# Patient Record
Sex: Male | Born: 1960 | Race: White | Hispanic: No | Marital: Married | Smoking: Former smoker
Health system: Southern US, Community
[De-identification: ages and names within clinical notes are randomized; demographics above are authoritative.]

## PROBLEM LIST (undated history)

## (undated) DIAGNOSIS — E785 Hyperlipidemia, unspecified: Secondary | ICD-10-CM

## (undated) DIAGNOSIS — G459 Transient cerebral ischemic attack, unspecified: Secondary | ICD-10-CM

## (undated) DIAGNOSIS — I10 Essential (primary) hypertension: Secondary | ICD-10-CM

## (undated) DIAGNOSIS — R079 Chest pain, unspecified: Secondary | ICD-10-CM

## (undated) DIAGNOSIS — I2510 Atherosclerotic heart disease of native coronary artery without angina pectoris: Secondary | ICD-10-CM

## (undated) DIAGNOSIS — J45909 Unspecified asthma, uncomplicated: Secondary | ICD-10-CM

## (undated) HISTORY — DX: Hyperlipidemia, unspecified: E78.5

## (undated) HISTORY — DX: Transient cerebral ischemic attack, unspecified: G45.9

## (undated) HISTORY — DX: Atherosclerotic heart disease of native coronary artery without angina pectoris: I25.10

## (undated) HISTORY — DX: Unspecified asthma, uncomplicated: J45.909

## (undated) HISTORY — PX: SINUS SURGERY WITH INSTATRAK: SHX5215

## (undated) HISTORY — DX: Morbid (severe) obesity due to excess calories: E66.01

## (undated) HISTORY — DX: Essential (primary) hypertension: I10

## (undated) HISTORY — DX: Chest pain, unspecified: R07.9

## (undated) HISTORY — PX: GALLBLADDER SURGERY: SHX652

---

## 1998-11-15 ENCOUNTER — Encounter: Payer: Self-pay | Admitting: Specialist

## 1998-11-15 ENCOUNTER — Ambulatory Visit (HOSPITAL_COMMUNITY): Admission: RE | Admit: 1998-11-15 | Discharge: 1998-11-15 | Payer: Self-pay | Admitting: Specialist

## 2001-06-07 ENCOUNTER — Encounter: Admission: RE | Admit: 2001-06-07 | Discharge: 2001-06-07 | Payer: Self-pay | Admitting: *Deleted

## 2001-06-07 ENCOUNTER — Encounter: Payer: Self-pay | Admitting: *Deleted

## 2001-06-28 ENCOUNTER — Ambulatory Visit (HOSPITAL_BASED_OUTPATIENT_CLINIC_OR_DEPARTMENT_OTHER): Admission: RE | Admit: 2001-06-28 | Discharge: 2001-06-28 | Payer: Self-pay | Admitting: *Deleted

## 2015-04-08 ENCOUNTER — Telehealth: Payer: Self-pay | Admitting: Cardiovascular Disease

## 2015-04-08 NOTE — Telephone Encounter (Signed)
Patient brought records in to office for his appointment on 04/14/15 with Dr Duke Salvia.  Records from Dr Wille Glaser @ [facility].  Records given to [facility] (medical records) for Dr Leonides Sake schedule on 04/14/15. lp

## 2015-04-14 ENCOUNTER — Encounter: Payer: Self-pay | Admitting: Cardiovascular Disease

## 2015-04-14 ENCOUNTER — Ambulatory Visit (INDEPENDENT_AMBULATORY_CARE_PROVIDER_SITE_OTHER): Payer: BLUE CROSS/BLUE SHIELD | Admitting: Cardiovascular Disease

## 2015-04-14 VITALS — BP 120/86 | HR 66 | Ht 64.0 in | Wt 264.0 lb

## 2015-04-14 DIAGNOSIS — R5383 Other fatigue: Secondary | ICD-10-CM

## 2015-04-14 DIAGNOSIS — R072 Precordial pain: Secondary | ICD-10-CM

## 2015-04-14 DIAGNOSIS — J45909 Unspecified asthma, uncomplicated: Secondary | ICD-10-CM | POA: Insufficient documentation

## 2015-04-14 DIAGNOSIS — R0602 Shortness of breath: Secondary | ICD-10-CM

## 2015-04-14 DIAGNOSIS — Z01812 Encounter for preprocedural laboratory examination: Secondary | ICD-10-CM

## 2015-04-14 DIAGNOSIS — D689 Coagulation defect, unspecified: Secondary | ICD-10-CM

## 2015-04-14 DIAGNOSIS — R079 Chest pain, unspecified: Secondary | ICD-10-CM | POA: Diagnosis not present

## 2015-04-14 DIAGNOSIS — I10 Essential (primary) hypertension: Secondary | ICD-10-CM

## 2015-04-14 DIAGNOSIS — G459 Transient cerebral ischemic attack, unspecified: Secondary | ICD-10-CM | POA: Insufficient documentation

## 2015-04-14 DIAGNOSIS — I2510 Atherosclerotic heart disease of native coronary artery without angina pectoris: Secondary | ICD-10-CM

## 2015-04-14 DIAGNOSIS — E785 Hyperlipidemia, unspecified: Secondary | ICD-10-CM

## 2015-04-14 NOTE — Patient Instructions (Signed)
Your physician has requested that you have a cardiac catheterization. Cardiac catheterization is used to diagnose and/or treat various heart conditions. Doctors may recommend this procedure for a number of different reasons. The most common reason is to evaluate chest pain. Chest pain can be a symptom of coronary artery disease (CAD), and cardiac catheterization can show whether plaque is narrowing or blocking your heart's arteries. This procedure is also used to evaluate the valves, as well as measure the blood flow and oxygen levels in different parts of your heart. For further information please visit [email or web address]   Following your catheterization, you will not be allowed to drive for 3 days.  No lifting, pushing, or pulling greater that 10 pounds is allowed for 1 week.  You will be required to have the following tests prior to the procedure:  1. Blood work-the blood work can be done no more than 7 days prior to the procedure.  It can be done at any [facility] lab.  There is one downstairs on the first floor of this building and one in the [facility] building [phone]).  2. Chest Xray-the chest xray order has already been placed at [facility] in the [facility] ([address] [place]).

## 2015-04-14 NOTE — Progress Notes (Signed)
Cardiology Office Note   Date:  04/15/2015   ID:  Randall Rojas, DOB 08-13-1960, MRN [id number]  PCP:  No primary care provider on file.  Cardiologist:   Madilyn Hook, MD   Chief Complaint  Rojas presents with  . New Evaluation    [name], MD--former Cardiologist in [place]//pt c/o pain in left arm and leg--feels strained, had chest pain a few weeks ago  . Shortness of Breath    on minimal exertion  . Edema    bilateral legs/feet/ankles      History of Present Illness: Randall Rojas is a 55 y.o. male with hypertension, asymptomatic coronary calcifications on CT, and palpitations who presents for an evaluation of chest pain.  Randall Rojas has been evaluated for chest pain numerous times since 2009.  He was previously cared for by Dr. [name] in [place], [place].  Randall Rojas reports sharp substernal chest pain that radiates across Randall left side of his chest.  Episodes last for 30-40 minutes and are associated with shortness of breath, nausea, and diaphoresis.  He also notes left arm and leg tingling.  This occurs separately from Randall chest pain.  These symptoms occur once or twice per month.  Randall most intense episode occurred this summer and he was evaluated in Randall ED in [place].  Cardiac enzymes and EKG were unremarkable.  He reports that Randall ED staff was unable to reach his cardiologist, which is what prompted him to get a second opinion in [place].    Randall Rojas has previously had two stress tests that were negative for ischemia.  In 2009 he had a pharmacologic Cardiolite that revealed an LVEF of 40% and no inducible ischemia.  Follow up echo showed normal systolic function.  He had an exercise Cardiolite 12/09/12 that was also negative for ischemia and had an LVEF of 54%.  Randall Rojas also underwent chest CT angiography in 2015 that was negative for PE but did reveal diffuse coronary calcification in all three coronary arteries.  Given this finding, per his  report and Dr. Deedra Ehrich notes, Randall plan was to pursue cardiac catheterization if he continued to have chest discomfort.  He last saw Dr. Wille Glaser 01/2014, at which time he reported palpitations.  He underwent a 30 day event monitor that was unremarkable.  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  He noes occasional chest pain with exertion but also gets it at rest.  He has been treated for GERD with Prilosec, though this does not alleviate his chest pain.  Nitroglycerin and aspirin do help when he has chest pain.  He was treated with pravastatin but was unable to tolerate it due to myalgias.  He has not tried any other statins and has been taking red yeast rice instead.  Randall Rojas was last seen by Dr. [name] on 02/11/14 due to palpitations. 1 Randall Rojas underwent nuclear stress testing that was negative for ischemia.  However, after Randall stress he continued have chest pain so he underwent CT angiography. This revealed non-occlusive coronary calcifications on cardiac CT.  He is intolerant of statins and has been taking red yeast rice, spirin and atenolol.  At that appointment he was noted to have a 1/6 systolic murmur and no arrhythmias.    Randall Rojas father had a heart attack in his 24s.  He denies a family history of premature CAD.    Past Medical History  Diagnosis Date  . TIA (transient ischemic attack)   .  Asthma   . Coronary artery disease   . Coronary artery calcification seen on CT scan 04/15/2015  . Essential hypertension 04/15/2015  . Hyperlipidemia 04/15/2015  . Morbid obesity (HCC) 04/15/2015  . Chest pain 04/15/2015    Past Surgical History  Procedure Laterality Date  . Gallbladder surgery    . Sinus surgery with instatrak       Current Outpatient Prescriptions  Medication Sig Dispense Refill  . albuterol (PROVENTIL HFA;VENTOLIN HFA) 108 (90 Base) MCG/ACT inhaler Inhale 2 puffs into Randall lungs every 6 (six) hours as needed for wheezing or shortness of  breath.    Marland Kitchen aspirin 81 MG EC tablet Take 1 tablet by mouth daily.    Marland Kitchen atenolol (TENORMIN) 50 MG tablet Take 50 mg by mouth daily.    . nitroGLYCERIN (NITROSTAT) 0.4 MG SL tablet Place 0.4 mg under Randall tongue as needed.    Marland Kitchen omeprazole (PRILOSEC) 40 MG capsule Take 1 capsule by mouth daily.  0  . Red Yeast Rice Extract 600 MG CAPS Take 1 capsule by mouth daily.     No current facility-administered medications for this visit.    Allergies:   Cephalexin    Social History:  Randall Rojas  reports that he quit smoking about 26 years ago. He has never used smokeless tobacco.   Family History:  Randall Rojas's family history includes Cancer in his brother and father; Heart attack in his father; Hypertension in his mother and sister; Rheum arthritis in his mother.    ROS:  Please see Randall history of present illness.   Otherwise, review of systems are positive for none.   All other systems are reviewed and negative.    PHYSICAL EXAM: VS:  BP 120/86 mmHg  Pulse 66  Ht  (1.626 m)  Wt 119.75 kg (264 lb)  BMI 45.29 kg/m2 , BMI Body mass index is 45.29 kg/(m^2). GENERAL:  Well appearing.  No acute distress. HEENT:  Pupils equal round and reactive, fundi not visualized, oral mucosa unremarkable NECK:  No jugular venous distention, waveform within normal limits, carotid upstroke brisk and symmetric, no bruits, no thyromegaly LYMPHATICS:  No cervical adenopathy LUNGS:  Clear to auscultation bilaterally HEART:  RRR.  PMI not displaced or sustained,S1 and S2 within normal limits, no S3, no S4, no clicks, no rubs, no  murmurs ABD:  Flat, positive bowel sounds normal in frequency in pitch, no bruits, no rebound, no guarding, no midline pulsatile mass, no hepatomegaly, no splenomegaly EXT:  2 plus pulses throughout, no edema, no cyanosis no clubbing SKIN:  No rashes no nodules NEURO:  Cranial nerves II through XII grossly intact, motor grossly intact throughout PSYCH:  Cognitively intact, oriented  to person place and time    EKG:  EKG is ordered today. Randall ekg ordered today demonstrates sinus rhythm.  Rate 66 bpm.  Echo 03/05/08: LVEF 55-60%. Mild concentric LVH. Mild left atrial enlargement.mild mitral regurgitation. Mild tricuspid regurgitation.  Dobutamine Cardiolite 03/09/08: LVEF 40%. No inducible ischemia.  Exercise Cardiolite 12/09/12:  8 METS. LVEF 54%. No inducible ischia.  30 day event monitor 03/09/14: Sinus rhythm, sinus tachycardia.   Recent Labs: No results found for requested labs within last 365 days.    Lipid Panel No results found for: CHOL, TRIG, HDL, CHOLHDL, VLDL, LDLCALC, LDLDIRECT    Wt Readings from Last 3 Encounters:  04/14/15 119.75 kg (264 lb)      ASSESSMENT AND PLAN:  # Chest pain: Randall Rojas symptoms are atypical and  he has undergone two negative stress tests.  However, he does have known coronary calcifications in all three coronary arteries and several risk factors including hypertension, suboptimally treated hyperlipidemia and obesity.  Given that he has been seen in Randall ED several times for chest pain, it is reasonable to proceed with cardiac catheterization as suggested to him by his cardiologist, Dr. Wille Glaser.  Although Randall likelihood of finding obstructive disease may be low, it is worth it to define his anatomy and assess Randall severity of Randall calcifications noted on CT.  He will need basic labs prior to cath.  We discussed Randall options, including repeat stress test and a dedicated cardiac CT-A.  He does not think he can tolerate another CT due to claustrophobia. He is uninterested in repeating a stress test.  We will also check a TSH/fT4 as an atypical cause of chest pain and because he has noted some palpitations.  Continue aspirin and atenolol.  # Edema, SOB: Randall Rojas reports edema and shortness of breath.  LVEF was >55% and there were no valvular abnormalities in 2009.  LVEF was 54% on echo in 2014.  He appears to be euvolemic on exam  today.  We will check a BNP today.  # Hypertension: BP well-controlled today.  Continue atenolol.  # Hyperlipidemia: Lipids have been elevated in Randall past, though I do not know exact values from his records.  He did not tolerate pravastatin.  He will come back for a fasting lipid panel.   Current medicines are reviewed at length with Randall Rojas today.  Randall Rojas does not have concerns regarding medicines.  Randall following changes have been made:  no change  Labs/ tests ordered today include:   Orders Placed This Encounter  Procedures  . DG Chest 2 View  . CBC  . Basic metabolic panel  . Protime-INR  . APTT  . TSH  . Brain natriuretic peptide  . EKG 12-Lead  . LEFT HEART CATHETERIZATION WITH CORONARY/GRAFT ANGIOGRAM     Disposition:   FU with Tavi Hoogendoorn C. Duke Salvia, MD, [facility] after cath.    This note was written with Randall assistance of speech recognition software.  Please excuse any transcriptional errors.  Signed, Eldar Robitaille C. Duke Salvia, MD, [facility]  04/15/2015 5:53 AM    [facility]

## 2015-04-15 ENCOUNTER — Ambulatory Visit
Admission: RE | Admit: 2015-04-15 | Discharge: 2015-04-15 | Disposition: A | Discharge status: Home / Self Care | Payer: BLUE CROSS/BLUE SHIELD | Source: Ambulatory Visit | Attending: Cardiovascular Disease | Admitting: Cardiovascular Disease

## 2015-04-15 ENCOUNTER — Encounter: Payer: Self-pay | Admitting: Cardiovascular Disease

## 2015-04-15 DIAGNOSIS — E66813 Obesity, class 3: Secondary | ICD-10-CM | POA: Insufficient documentation

## 2015-04-15 DIAGNOSIS — I2584 Coronary atherosclerosis due to calcified coronary lesion: Secondary | ICD-10-CM | POA: Insufficient documentation

## 2015-04-15 DIAGNOSIS — I2510 Atherosclerotic heart disease of native coronary artery without angina pectoris: Secondary | ICD-10-CM

## 2015-04-15 DIAGNOSIS — E785 Hyperlipidemia, unspecified: Secondary | ICD-10-CM

## 2015-04-15 DIAGNOSIS — E782 Mixed hyperlipidemia: Secondary | ICD-10-CM | POA: Insufficient documentation

## 2015-04-15 DIAGNOSIS — R079 Chest pain, unspecified: Secondary | ICD-10-CM

## 2015-04-15 DIAGNOSIS — Z01812 Encounter for preprocedural laboratory examination: Secondary | ICD-10-CM

## 2015-04-15 DIAGNOSIS — I10 Essential (primary) hypertension: Secondary | ICD-10-CM

## 2015-04-15 HISTORY — DX: Morbid (severe) obesity due to excess calories: E66.01

## 2015-04-15 HISTORY — DX: Mixed hyperlipidemia: E78.2

## 2015-04-15 HISTORY — DX: Atherosclerotic heart disease of native coronary artery without angina pectoris: I25.10

## 2015-04-15 HISTORY — DX: Chest pain, unspecified: R07.9

## 2015-04-15 HISTORY — DX: Obesity, class 3: E66.813

## 2015-04-15 HISTORY — DX: Hyperlipidemia, unspecified: E78.5

## 2015-04-15 HISTORY — DX: Essential (primary) hypertension: I10

## 2015-04-15 LAB — TSH: TSH: 1.062 u[IU]/mL (ref 0.350–4.500)

## 2015-04-15 LAB — CBC
HCT: 46.2 % (ref 39.0–52.0)
Hemoglobin: 15.6 g/dL (ref 13.0–17.0)
MCH: 31 pg (ref 26.0–34.0)
MCHC: 33.8 g/dL (ref 30.0–36.0)
MCV: 91.7 fL (ref 78.0–100.0)
MPV: 10.5 fL (ref 8.6–12.4)
PLATELETS: 169 10*3/uL (ref 150–400)
RBC: 5.04 MIL/uL (ref 4.22–5.81)
RDW: 14.3 % (ref 11.5–15.5)
WBC: 7.6 10*3/uL (ref 4.0–10.5)

## 2015-04-15 LAB — BASIC METABOLIC PANEL
BUN: 13 mg/dL (ref 7–25)
CALCIUM: 9.4 mg/dL (ref 8.6–10.3)
CHLORIDE: 103 mmol/L (ref 98–110)
CO2: 27 mmol/L (ref 20–31)
Creat: 0.98 mg/dL (ref 0.70–1.33)
Glucose, Bld: 93 mg/dL (ref 65–99)
Potassium: 4.4 mmol/L (ref 3.5–5.3)
SODIUM: 139 mmol/L (ref 135–146)

## 2015-04-16 LAB — BRAIN NATRIURETIC PEPTIDE: Brain Natriuretic Peptide: 18.4 pg/mL (ref 0.0–100.0)

## 2015-04-16 LAB — APTT: APTT: 31 s (ref 24–37)

## 2015-04-16 LAB — PROTIME-INR
INR: 0.92 (ref <1.50)
Prothrombin Time: 12.5 seconds (ref 11.6–15.2)

## 2015-04-19 ENCOUNTER — Telehealth: Payer: Self-pay | Admitting: *Deleted

## 2015-04-19 NOTE — Telephone Encounter (Signed)
-----   Message from Chilton Si, MD sent at 04/18/2015  4:07 PM EST ----- Normal labs.

## 2015-04-19 NOTE — Telephone Encounter (Signed)
Spoke to patient. Result given . Verbalized understanding Schedule for cath.

## 2015-04-22 ENCOUNTER — Encounter (HOSPITAL_COMMUNITY)
Admission: RE | Disposition: A | Discharge status: Home / Self Care | Payer: Self-pay | Source: Ambulatory Visit | Attending: Cardiology

## 2015-04-22 ENCOUNTER — Ambulatory Visit (HOSPITAL_COMMUNITY)
Admission: RE | Admit: 2015-04-22 | Discharge: 2015-04-22 | Disposition: A | Discharge status: Home / Self Care | Payer: BLUE CROSS/BLUE SHIELD | Source: Ambulatory Visit | Attending: Cardiology | Admitting: Cardiology

## 2015-04-22 DIAGNOSIS — J45909 Unspecified asthma, uncomplicated: Secondary | ICD-10-CM | POA: Insufficient documentation

## 2015-04-22 DIAGNOSIS — R002 Palpitations: Secondary | ICD-10-CM | POA: Insufficient documentation

## 2015-04-22 DIAGNOSIS — Z7982 Long term (current) use of aspirin: Secondary | ICD-10-CM | POA: Diagnosis not present

## 2015-04-22 DIAGNOSIS — I10 Essential (primary) hypertension: Secondary | ICD-10-CM | POA: Diagnosis not present

## 2015-04-22 DIAGNOSIS — Z87891 Personal history of nicotine dependence: Secondary | ICD-10-CM | POA: Diagnosis not present

## 2015-04-22 DIAGNOSIS — R072 Precordial pain: Secondary | ICD-10-CM | POA: Insufficient documentation

## 2015-04-22 DIAGNOSIS — Z8673 Personal history of transient ischemic attack (TIA), and cerebral infarction without residual deficits: Secondary | ICD-10-CM | POA: Insufficient documentation

## 2015-04-22 DIAGNOSIS — E785 Hyperlipidemia, unspecified: Secondary | ICD-10-CM | POA: Diagnosis not present

## 2015-04-22 DIAGNOSIS — R079 Chest pain, unspecified: Secondary | ICD-10-CM | POA: Diagnosis present

## 2015-04-22 DIAGNOSIS — Z6841 Body Mass Index (BMI) 40.0 and over, adult: Secondary | ICD-10-CM | POA: Diagnosis not present

## 2015-04-22 DIAGNOSIS — E66813 Obesity, class 3: Secondary | ICD-10-CM | POA: Diagnosis present

## 2015-04-22 DIAGNOSIS — I2510 Atherosclerotic heart disease of native coronary artery without angina pectoris: Secondary | ICD-10-CM | POA: Diagnosis present

## 2015-04-22 DIAGNOSIS — F40240 Claustrophobia: Secondary | ICD-10-CM | POA: Diagnosis not present

## 2015-04-22 DIAGNOSIS — Z8249 Family history of ischemic heart disease and other diseases of the circulatory system: Secondary | ICD-10-CM | POA: Insufficient documentation

## 2015-04-22 DIAGNOSIS — K219 Gastro-esophageal reflux disease without esophagitis: Secondary | ICD-10-CM | POA: Insufficient documentation

## 2015-04-22 DIAGNOSIS — E782 Mixed hyperlipidemia: Secondary | ICD-10-CM | POA: Diagnosis present

## 2015-04-22 DIAGNOSIS — I2584 Coronary atherosclerosis due to calcified coronary lesion: Secondary | ICD-10-CM | POA: Diagnosis present

## 2015-04-22 HISTORY — PX: CARDIAC CATHETERIZATION: SHX172

## 2015-04-22 SURGERY — LEFT HEART CATH AND CORONARY ANGIOGRAPHY
Anesthesia: LOCAL

## 2015-04-22 MED ORDER — SODIUM CHLORIDE 0.9% FLUSH
3.0000 mL | INTRAVENOUS | Status: DC | PRN
Start: 2015-04-22 — End: 2015-04-22

## 2015-04-22 MED ORDER — VERAPAMIL HCL 2.5 MG/ML IV SOLN
INTRAVENOUS | Status: AC
  Filled 2015-04-22: qty 2

## 2015-04-22 MED ORDER — HEPARIN (PORCINE) IN NACL 2-0.9 UNIT/ML-% IJ SOLN
INTRAMUSCULAR | Status: AC
  Filled 2015-04-22: qty 1000

## 2015-04-22 MED ORDER — VERAPAMIL HCL 2.5 MG/ML IV SOLN
INTRAVENOUS | Status: DC | PRN
  Administered 2015-04-22: 13:00:00 via INTRA_ARTERIAL

## 2015-04-22 MED ORDER — SODIUM CHLORIDE 0.9% FLUSH: 3.0000 mL | Freq: Two times a day (BID) | INTRAVENOUS | Status: DC

## 2015-04-22 MED ORDER — SODIUM CHLORIDE 0.9 % IV SOLN: 250.0000 mL | INTRAVENOUS | Status: DC | PRN

## 2015-04-22 MED ORDER — MIDAZOLAM HCL 2 MG/2ML IJ SOLN
INTRAMUSCULAR | Status: DC | PRN
  Administered 2015-04-22: 1 mg via INTRAVENOUS

## 2015-04-22 MED ORDER — IOHEXOL 350 MG/ML SOLN
INTRAVENOUS | Status: DC | PRN
Start: 2015-04-22 — End: 2015-04-22
  Administered 2015-04-22: 95 mL via INTRA_ARTERIAL

## 2015-04-22 MED ORDER — SODIUM CHLORIDE 0.9 % WEIGHT BASED INFUSION
3.0000 mL/kg/h | INTRAVENOUS | Status: DC
  Administered 2015-04-22: 3 mL/kg/h via INTRAVENOUS

## 2015-04-22 MED ORDER — HEPARIN SODIUM (PORCINE) 1000 UNIT/ML IJ SOLN
INTRAMUSCULAR | Status: DC | PRN
  Administered 2015-04-22: 6000 [IU] via INTRAVENOUS

## 2015-04-22 MED ORDER — ASPIRIN 81 MG PO CHEW
81.0000 mg | CHEWABLE_TABLET | ORAL | Status: AC
  Administered 2015-04-22: 81 mg via ORAL

## 2015-04-22 MED ORDER — FENTANYL CITRATE (PF) 100 MCG/2ML IJ SOLN
INTRAMUSCULAR | Status: DC | PRN
  Administered 2015-04-22: 25 ug via INTRAVENOUS

## 2015-04-22 MED ORDER — HEPARIN SODIUM (PORCINE) 1000 UNIT/ML IJ SOLN
INTRAMUSCULAR | Status: AC
  Filled 2015-04-22: qty 1

## 2015-04-22 MED ORDER — ASPIRIN 81 MG PO CHEW
CHEWABLE_TABLET | ORAL | Status: AC
  Administered 2015-04-22: 81 mg via ORAL
  Filled 2015-04-22: qty 1

## 2015-04-22 MED ORDER — FENTANYL CITRATE (PF) 100 MCG/2ML IJ SOLN
INTRAMUSCULAR | Status: AC
  Filled 2015-04-22: qty 2

## 2015-04-22 MED ORDER — SODIUM CHLORIDE 0.9 % WEIGHT BASED INFUSION
3.0000 mL/kg/h | INTRAVENOUS | Status: AC
  Administered 2015-04-22: 3 mL/kg/h via INTRAVENOUS

## 2015-04-22 MED ORDER — SODIUM CHLORIDE 0.9 % WEIGHT BASED INFUSION
1.0000 mL/kg/h | INTRAVENOUS | Status: DC
  Administered 2015-04-22: 1 mL/kg/h via INTRAVENOUS

## 2015-04-22 MED ORDER — MIDAZOLAM HCL 2 MG/2ML IJ SOLN
INTRAMUSCULAR | Status: AC
  Filled 2015-04-22: qty 2

## 2015-04-22 MED ORDER — LIDOCAINE HCL (PF) 1 % IJ SOLN
INTRAMUSCULAR | Status: AC
  Filled 2015-04-22: qty 30

## 2015-04-22 MED ORDER — SODIUM CHLORIDE 0.9% FLUSH: 3.0000 mL | INTRAVENOUS | Status: DC | PRN

## 2015-04-22 MED ORDER — HEPARIN (PORCINE) IN NACL 2-0.9 UNIT/ML-% IJ SOLN
INTRAMUSCULAR | Status: DC | PRN
  Administered 2015-04-22: 14:00:00

## 2015-04-22 SURGICAL SUPPLY — 14 items
CATH INFINITI 5 FR JL3.5 (CATHETERS) ×2 IMPLANT
CATH INFINITI 5FR ANG PIGTAIL (CATHETERS) ×2 IMPLANT
CATH INFINITI JR4 5F (CATHETERS) ×2 IMPLANT
CATH LAUNCHER 5F AL1 (CATHETERS) IMPLANT
CATH SITESEER 5F NTR (CATHETERS) ×1 IMPLANT
CATHETER LAUNCHER 5F AL1 (CATHETERS) ×2
DEVICE RAD COMP TR BAND LRG (VASCULAR PRODUCTS) ×2 IMPLANT
GLIDESHEATH SLEND SS 6F .021 (SHEATH) ×2 IMPLANT
KIT HEART LEFT (KITS) ×2 IMPLANT
PACK CARDIAC CATHETERIZATION (CUSTOM PROCEDURE TRAY) ×2 IMPLANT
SYR MEDRAD MARK V 150ML (SYRINGE) ×2 IMPLANT
TRANSDUCER W/STOPCOCK (MISCELLANEOUS) ×2 IMPLANT
TUBING CIL FLEX 10 FLL-RA (TUBING) ×2 IMPLANT
WIRE SAFE-T 1.5MM-J .035X260CM (WIRE) ×2 IMPLANT

## 2015-04-22 NOTE — Interval H&P Note (Signed)
History and Physical Interval Note:  04/22/2015 1:17 PM  Randall Rojas  has presented today for surgery, with the diagnosis of cp  The various methods of treatment have been discussed with the patient and family. After consideration of risks, benefits and other options for treatment, the patient has consented to  Procedure(s): Left Heart Cath and Coronary Angiography (N/A) as a surgical intervention .  The patient's history has been reviewed, patient examined, no change in status, stable for surgery.  I have reviewed the patient's chart and labs.  Questions were answered to the patient's satisfaction.   Cath Lab Visit (complete for each Cath Lab visit)  Clinical Evaluation Leading to the Procedure:   ACS: No.  Non-ACS:    Anginal Classification: CCS III  Anti-ischemic medical therapy: Minimal Therapy (1 class of medications)  Non-Invasive Test Results: No non-invasive testing performed  Prior CABG: No previous CABG        Theron Arista [facility] 04/22/2015 1:17 PM

## 2015-04-22 NOTE — Research (Signed)
CADLAD Informed Consent   Subject Name: Randall Rojas  Subject met inclusion and exclusion criteria.  The informed consent form, study requirements and expectations were reviewed with the subject and questions and concerns were addressed prior to the signing of the consent form.  The subject verbalized understanding of the trail requirements.  The subject agreed to participate in the CADLAD trial and signed the informed consent.  The informed consent was obtained prior to performance of any protocol-specific procedures for the subject.  A copy of the signed informed consent was given to the subject and a copy was placed in the subject's medical record.  [name] 04/22/2015, [phone] am

## 2015-04-22 NOTE — Discharge Instructions (Signed)
Radial Site Care °Refer to this sheet in the next few weeks. These instructions provide you with information about caring for yourself after your procedure. Your health care provider may also give you more specific instructions. Your treatment has been planned according to current medical practices, but problems sometimes occur. Call your health care provider if you have any problems or questions after your procedure. °WHAT TO EXPECT AFTER THE PROCEDURE °After your procedure, it is typical to have the following: °· Bruising at the radial site that usually fades within 1-2 weeks. °· Blood collecting in the tissue (hematoma) that may be painful to the touch. It should usually decrease in size and tenderness within 1-2 weeks. °HOME CARE INSTRUCTIONS °· Take medicines only as directed by your health care provider. °· You may shower 24-48 hours after the procedure or as directed by your health care provider. Remove the bandage (dressing) and gently wash the site with plain soap and water. Pat the area dry with a clean towel. Do not rub the site, because this may cause bleeding. °· Do not take baths, swim, or use a hot tub until your health care provider approves. °· Check your insertion site every day for redness, swelling, or drainage. °· Do not apply powder or lotion to the site. °· Do not flex or bend the affected arm for 24 hours or as directed by your health care provider. °· Do not push or pull heavy objects with the affected arm for 24 hours or as directed by your health care provider. °· Do not lift over 10 lb (4.5 kg) for 5 days after your procedure or as directed by your health care provider. °· Ask your health care provider when it is okay to: °¨ Return to work or school. °¨ Resume usual physical activities or sports. °¨ Resume sexual activity. °· Do not drive home if you are discharged the same day as the procedure. Have someone else drive you. °· You may drive 24 hours after the procedure unless otherwise  instructed by your health care provider. °· Do not operate machinery or power tools for 24 hours after the procedure. °· If your procedure was done as an outpatient procedure, which means that you went home the same day as your procedure, a responsible adult should be with you for the first 24 hours after you arrive home. °· Keep all follow-up visits as directed by your health care provider. This is important. °SEEK MEDICAL CARE IF: °· You have a fever. °· You have chills. °· You have increased bleeding from the radial site. Hold pressure on the site. CALL 911 °SEEK IMMEDIATE MEDICAL CARE IF: °· You have unusual pain at the radial site. °· You have redness, warmth, or swelling at the radial site. °· You have drainage (other than a small amount of blood on the dressing) from the radial site. °· The radial site is bleeding, and the bleeding does not stop after 30 minutes of holding steady pressure on the site. °· Your arm or hand becomes pale, cool, tingly, or numb. °  °This information is not intended to replace advice given to you by your health care provider. Make sure you discuss any questions you have with your health care provider. °  °Document Released: 04/15/2010 Document Revised: 04/03/2014 Document Reviewed: 09/29/2013 °Elsevier Interactive Patient Education ©2016 [employer] ° °

## 2015-04-22 NOTE — H&P (View-Only) (Signed)
Cardiology Office Note   Date:  04/15/2015   ID:  Randall Rojas, DOB 08-13-1960, MRN [id number]  PCP:  No primary care provider on file.  Cardiologist:   Madilyn Hook, MD   Chief Complaint  Patient presents with  . 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 Shortness of Breath    on minimal exertion  . Edema    bilateral legs/feet/ankles      History of Present Illness: Randall Rojas is a 55 y.o. male with hypertension, asymptomatic coronary calcifications on CT, and palpitations who presents for an evaluation of chest pain.  Randall Rojas has been evaluated for chest pain numerous times since 2009.  He was previously cared for by Dr. Bonnielee Rojas in [place], [place].  Randall Rojas reports sharp substernal chest pain that radiates across the left side of his chest.  Episodes last for 30-40 minutes and are associated with shortness of breath, nausea, and diaphoresis.  He also notes left arm and leg tingling.  This occurs separately from the chest pain.  These symptoms occur once or twice per month.  The most intense episode occurred this summer and he was evaluated in the ED in [place].  Cardiac enzymes and EKG were unremarkable.  He reports that the ED staff was unable to reach his cardiologist, which is what prompted him to get a second opinion in [place].    Randall Rojas has previously had two stress tests that were negative for ischemia.  In 2009 he had a pharmacologic Cardiolite that revealed an LVEF of 40% and no inducible ischemia.  Follow up echo showed normal systolic function.  He had an exercise Cardiolite 12/09/12 that was also negative for ischemia and had an LVEF of 54%.  Randall Rojas also underwent chest CT angiography in 2015 that was negative for PE but did reveal diffuse coronary calcification in all three coronary arteries.  Given this finding, per his  report and Dr. Deedra Ehrich notes, the plan was to pursue cardiac catheterization if he continued to have chest discomfort.  He last saw Dr. Wille Glaser 01/2014, at which time he reported palpitations.  He underwent a 30 day event monitor that was unremarkable.  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  He noes occasional chest pain with exertion but also gets it at rest.  He has been treated for GERD with Prilosec, though this does not alleviate his chest pain.  Nitroglycerin and aspirin do help when he has chest pain.  He was treated with pravastatin but was unable to tolerate it due to myalgias.  He has not tried any other statins and has been taking red yeast rice instead.  Randall Rojas was last seen by Dr. Bonnielee Rojas on 02/11/14 due to palpitations. 1 Randall Rojas underwent nuclear stress testing that was negative for ischemia.  However, after the stress he continued have chest pain so he underwent CT angiography. This revealed non-occlusive coronary calcifications on cardiac CT.  He is intolerant of statins and has been taking red yeast rice, spirin and atenolol.  At that appointment he was noted to have a 1/6 systolic murmur and no arrhythmias.    Randall Rojas father had a heart attack in his 24s.  He denies a family history of premature CAD.    Past Medical History  Diagnosis Date  . TIA (transient ischemic attack)   .  Asthma   . Coronary artery disease   . Coronary artery calcification seen on CT scan 04/15/2015  . Essential hypertension 04/15/2015  . Hyperlipidemia 04/15/2015  . Morbid obesity (HCC) 04/15/2015  . Chest pain 04/15/2015    Past Surgical History  Procedure Laterality Date  . Gallbladder surgery    . Sinus surgery with instatrak       Current Outpatient Prescriptions  Medication Sig Dispense Refill  . albuterol (PROVENTIL HFA;VENTOLIN HFA) 108 (90 Base) MCG/ACT inhaler Inhale 2 puffs into the lungs every 6 (six) hours as needed for wheezing or shortness of  breath.    Marland Kitchen aspirin 81 MG EC tablet Take 1 tablet by mouth daily.    Marland Kitchen atenolol (TENORMIN) 50 MG tablet Take 50 mg by mouth daily.    . nitroGLYCERIN (NITROSTAT) 0.4 MG SL tablet Place 0.4 mg under the tongue as needed.    Marland Kitchen omeprazole (PRILOSEC) 40 MG capsule Take 1 capsule by mouth daily.  0  . Red Yeast Rice Extract 600 MG CAPS Take 1 capsule by mouth daily.     No current facility-administered medications for this visit.    Allergies:   Cephalexin    Social History:  The patient  reports that he quit smoking about 26 years ago. He has never used smokeless tobacco.   Family History:  The patient's family history includes Cancer in his brother and father; Heart attack in his father; Hypertension in his mother and sister; Rheum arthritis in his mother.    ROS:  Please see the history of present illness.   Otherwise, review of systems are positive for none.   All other systems are reviewed and negative.    PHYSICAL EXAM: VS:  BP 120/86 mmHg  Pulse 66  Ht  (1.626 m)  Wt 119.75 kg (264 lb)  BMI 45.29 kg/m2 , BMI Body mass index is 45.29 kg/(m^2). GENERAL:  Well appearing.  No acute distress. HEENT:  Pupils equal round and reactive, fundi not visualized, oral mucosa unremarkable NECK:  No jugular venous distention, waveform within normal limits, carotid upstroke brisk and symmetric, no bruits, no thyromegaly LYMPHATICS:  No cervical adenopathy LUNGS:  Clear to auscultation bilaterally HEART:  RRR.  PMI not displaced or sustained,S1 and S2 within normal limits, no S3, no S4, no clicks, no rubs, no  murmurs ABD:  Flat, positive bowel sounds normal in frequency in pitch, no bruits, no rebound, no guarding, no midline pulsatile mass, no hepatomegaly, no splenomegaly EXT:  2 plus pulses throughout, no edema, no cyanosis no clubbing SKIN:  No rashes no nodules NEURO:  Cranial nerves II through XII grossly intact, motor grossly intact throughout PSYCH:  Cognitively intact, oriented  to person place and time    EKG:  EKG is ordered today. The ekg ordered today demonstrates sinus rhythm.  Rate 66 bpm.  Echo 03/05/08: LVEF 55-60%. Mild concentric LVH. Mild left atrial enlargement.mild mitral regurgitation. Mild tricuspid regurgitation.  Dobutamine Cardiolite 03/09/08: LVEF 40%. No inducible ischemia.  Exercise Cardiolite 12/09/12:  8 METS. LVEF 54%. No inducible ischia.  30 day event monitor 03/09/14: Sinus rhythm, sinus tachycardia.   Recent Labs: No results found for requested labs within last 365 days.    Lipid Panel No results found for: CHOL, TRIG, HDL, CHOLHDL, VLDL, LDLCALC, LDLDIRECT    Wt Readings from Last 3 Encounters:  04/14/15 119.75 kg (264 lb)      ASSESSMENT AND PLAN:  # Chest pain: Mr. Chalfin symptoms are atypical and  he has undergone two negative stress tests.  However, he does have known coronary calcifications in all three coronary arteries and several risk factors including hypertension, suboptimally treated hyperlipidemia and obesity.  Given that he has been seen in the ED several times for chest pain, it is reasonable to proceed with cardiac catheterization as suggested to him by his cardiologist, Dr. Wille Glaser.  Although the likelihood of finding obstructive disease may be low, it is worth it to define his anatomy and assess the severity of the calcifications noted on CT.  He will need basic labs prior to cath.  We discussed the options, including repeat stress test and a dedicated cardiac CT-A.  He does not think he can tolerate another CT due to claustrophobia. He is uninterested in repeating a stress test.  We will also check a TSH/fT4 as an atypical cause of chest pain and because he has noted some palpitations.  Continue aspirin and atenolol.  # Edema, SOB: Mr. Platts reports edema and shortness of breath.  LVEF was >55% and there were no valvular abnormalities in 2009.  LVEF was 54% on echo in 2014.  He appears to be euvolemic on exam  today.  We will check a BNP today.  # Hypertension: BP well-controlled today.  Continue atenolol.  # Hyperlipidemia: Lipids have been elevated in the past, though I do not know exact values from his records.  He did not tolerate pravastatin.  He will come back for a fasting lipid panel.   Current medicines are reviewed at length with the patient today.  The patient does not have concerns regarding medicines.  The following changes have been made:  no change  Labs/ tests ordered today include:   Orders Placed This Encounter  Procedures  . DG Chest 2 View  . CBC  . Basic metabolic panel  . Protime-INR  . APTT  . TSH  . Brain natriuretic peptide  . EKG 12-Lead  . LEFT HEART CATHETERIZATION WITH CORONARY/GRAFT ANGIOGRAM     Disposition:   FU with Demarie Uhlig C. Duke Salvia, MD, [facility] after cath.    This note was written with the assistance of speech recognition software.  Please excuse any transcriptional errors.  Signed, Omeed Osuna C. Duke Salvia, MD, [facility]  04/15/2015 5:53 AM    [facility]

## 2015-04-23 ENCOUNTER — Encounter (HOSPITAL_COMMUNITY): Payer: Self-pay | Admitting: Cardiology

## 2015-05-27 ENCOUNTER — Ambulatory Visit: Payer: BLUE CROSS/BLUE SHIELD | Admitting: Cardiovascular Disease

## 2015-06-06 DIAGNOSIS — D72829 Elevated white blood cell count, unspecified: Secondary | ICD-10-CM

## 2015-06-06 DIAGNOSIS — R739 Hyperglycemia, unspecified: Secondary | ICD-10-CM | POA: Insufficient documentation

## 2015-06-06 HISTORY — DX: Hyperglycemia, unspecified: R73.9

## 2015-06-06 HISTORY — DX: Elevated white blood cell count, unspecified: D72.829

## 2017-02-20 DIAGNOSIS — L299 Pruritus, unspecified: Secondary | ICD-10-CM | POA: Diagnosis not present

## 2017-02-20 DIAGNOSIS — B351 Tinea unguium: Secondary | ICD-10-CM | POA: Diagnosis not present

## 2017-03-18 DIAGNOSIS — R51 Headache: Secondary | ICD-10-CM | POA: Diagnosis not present

## 2017-03-18 DIAGNOSIS — W000XXA Fall on same level due to ice and snow, initial encounter: Secondary | ICD-10-CM | POA: Diagnosis not present

## 2017-03-18 DIAGNOSIS — R002 Palpitations: Secondary | ICD-10-CM | POA: Diagnosis not present

## 2017-03-18 DIAGNOSIS — S0990XA Unspecified injury of head, initial encounter: Secondary | ICD-10-CM | POA: Diagnosis not present

## 2017-03-18 DIAGNOSIS — R531 Weakness: Secondary | ICD-10-CM | POA: Diagnosis not present

## 2017-03-18 DIAGNOSIS — R0789 Other chest pain: Secondary | ICD-10-CM | POA: Diagnosis not present

## 2017-03-18 DIAGNOSIS — R110 Nausea: Secondary | ICD-10-CM | POA: Diagnosis not present

## 2017-03-18 DIAGNOSIS — F0781 Postconcussional syndrome: Secondary | ICD-10-CM | POA: Diagnosis not present

## 2017-03-19 DIAGNOSIS — R002 Palpitations: Secondary | ICD-10-CM | POA: Diagnosis not present

## 2017-04-10 DIAGNOSIS — G4733 Obstructive sleep apnea (adult) (pediatric): Secondary | ICD-10-CM | POA: Diagnosis not present

## 2017-07-02 DIAGNOSIS — M79605 Pain in left leg: Secondary | ICD-10-CM | POA: Diagnosis not present

## 2017-07-02 DIAGNOSIS — Y92096 Garden or yard of other non-institutional residence as the place of occurrence of the external cause: Secondary | ICD-10-CM | POA: Diagnosis not present

## 2017-07-02 DIAGNOSIS — Y999 Unspecified external cause status: Secondary | ICD-10-CM | POA: Diagnosis not present

## 2017-07-02 DIAGNOSIS — M7062 Trochanteric bursitis, left hip: Secondary | ICD-10-CM | POA: Diagnosis not present

## 2017-07-02 DIAGNOSIS — X509XXA Other and unspecified overexertion or strenuous movements or postures, initial encounter: Secondary | ICD-10-CM | POA: Diagnosis not present

## 2017-07-06 DIAGNOSIS — M7662 Achilles tendinitis, left leg: Secondary | ICD-10-CM | POA: Diagnosis not present

## 2017-07-06 DIAGNOSIS — M25572 Pain in left ankle and joints of left foot: Secondary | ICD-10-CM | POA: Diagnosis not present

## 2017-07-06 DIAGNOSIS — M65272 Calcific tendinitis, left ankle and foot: Secondary | ICD-10-CM | POA: Diagnosis not present

## 2017-07-20 DIAGNOSIS — G4733 Obstructive sleep apnea (adult) (pediatric): Secondary | ICD-10-CM | POA: Diagnosis not present

## 2017-07-31 DIAGNOSIS — S83242A Other tear of medial meniscus, current injury, left knee, initial encounter: Secondary | ICD-10-CM | POA: Diagnosis not present

## 2017-07-31 DIAGNOSIS — M25862 Other specified joint disorders, left knee: Secondary | ICD-10-CM | POA: Diagnosis not present

## 2017-07-31 DIAGNOSIS — M25861 Other specified joint disorders, right knee: Secondary | ICD-10-CM | POA: Diagnosis not present

## 2017-07-31 DIAGNOSIS — M1712 Unilateral primary osteoarthritis, left knee: Secondary | ICD-10-CM | POA: Diagnosis not present

## 2017-10-18 DIAGNOSIS — G4733 Obstructive sleep apnea (adult) (pediatric): Secondary | ICD-10-CM | POA: Diagnosis not present

## 2018-01-16 DIAGNOSIS — G4733 Obstructive sleep apnea (adult) (pediatric): Secondary | ICD-10-CM | POA: Diagnosis not present

## 2018-05-23 DIAGNOSIS — Z23 Encounter for immunization: Secondary | ICD-10-CM | POA: Diagnosis not present

## 2018-07-18 DIAGNOSIS — G4733 Obstructive sleep apnea (adult) (pediatric): Secondary | ICD-10-CM | POA: Diagnosis not present

## 2018-10-17 DIAGNOSIS — G4733 Obstructive sleep apnea (adult) (pediatric): Secondary | ICD-10-CM | POA: Diagnosis not present

## 2018-10-25 DIAGNOSIS — M7661 Achilles tendinitis, right leg: Secondary | ICD-10-CM | POA: Diagnosis not present

## 2018-10-25 DIAGNOSIS — M7662 Achilles tendinitis, left leg: Secondary | ICD-10-CM | POA: Diagnosis not present

## 2018-10-25 DIAGNOSIS — M25572 Pain in left ankle and joints of left foot: Secondary | ICD-10-CM | POA: Diagnosis not present

## 2019-01-02 DIAGNOSIS — R600 Localized edema: Secondary | ICD-10-CM | POA: Diagnosis not present

## 2019-01-02 DIAGNOSIS — M2559 Pain in other specified joint: Secondary | ICD-10-CM | POA: Diagnosis not present

## 2019-01-02 DIAGNOSIS — R799 Abnormal finding of blood chemistry, unspecified: Secondary | ICD-10-CM | POA: Diagnosis not present

## 2019-01-02 DIAGNOSIS — R5383 Other fatigue: Secondary | ICD-10-CM | POA: Diagnosis not present

## 2019-04-06 ENCOUNTER — Encounter (HOSPITAL_COMMUNITY): Payer: Self-pay | Admitting: Internal Medicine

## 2019-04-06 ENCOUNTER — Inpatient Hospital Stay (HOSPITAL_COMMUNITY): Payer: Commercial Managed Care - PPO

## 2019-04-06 ENCOUNTER — Inpatient Hospital Stay (HOSPITAL_COMMUNITY)
Admission: AD | Admit: 2019-04-06 | Discharge: 2019-04-07 | DRG: 177 | Disposition: A | Discharge status: Home / Self Care | Payer: Commercial Managed Care - PPO | Source: Other Acute Inpatient Hospital | Attending: Student | Admitting: Student

## 2019-04-06 ENCOUNTER — Other Ambulatory Visit: Payer: Self-pay

## 2019-04-06 DIAGNOSIS — Z8673 Personal history of transient ischemic attack (TIA), and cerebral infarction without residual deficits: Secondary | ICD-10-CM

## 2019-04-06 DIAGNOSIS — J1282 Pneumonia due to coronavirus disease 2019: Secondary | ICD-10-CM | POA: Diagnosis present

## 2019-04-06 DIAGNOSIS — E66813 Obesity, class 3: Secondary | ICD-10-CM

## 2019-04-06 DIAGNOSIS — R739 Hyperglycemia, unspecified: Secondary | ICD-10-CM | POA: Diagnosis present

## 2019-04-06 DIAGNOSIS — I10 Essential (primary) hypertension: Secondary | ICD-10-CM | POA: Diagnosis present

## 2019-04-06 DIAGNOSIS — I2510 Atherosclerotic heart disease of native coronary artery without angina pectoris: Secondary | ICD-10-CM | POA: Diagnosis present

## 2019-04-06 DIAGNOSIS — U071 COVID-19: Principal | ICD-10-CM | POA: Diagnosis present

## 2019-04-06 DIAGNOSIS — Z87891 Personal history of nicotine dependence: Secondary | ICD-10-CM | POA: Diagnosis not present

## 2019-04-06 DIAGNOSIS — Z79899 Other long term (current) drug therapy: Secondary | ICD-10-CM

## 2019-04-06 DIAGNOSIS — Z8249 Family history of ischemic heart disease and other diseases of the circulatory system: Secondary | ICD-10-CM

## 2019-04-06 DIAGNOSIS — Z888 Allergy status to other drugs, medicaments and biological substances status: Secondary | ICD-10-CM

## 2019-04-06 DIAGNOSIS — E785 Hyperlipidemia, unspecified: Secondary | ICD-10-CM | POA: Diagnosis present

## 2019-04-06 DIAGNOSIS — Z6841 Body Mass Index (BMI) 40.0 and over, adult: Secondary | ICD-10-CM | POA: Diagnosis not present

## 2019-04-06 DIAGNOSIS — J45901 Unspecified asthma with (acute) exacerbation: Secondary | ICD-10-CM | POA: Diagnosis present

## 2019-04-06 DIAGNOSIS — T380X5A Adverse effect of glucocorticoids and synthetic analogues, initial encounter: Secondary | ICD-10-CM | POA: Diagnosis present

## 2019-04-06 DIAGNOSIS — D696 Thrombocytopenia, unspecified: Secondary | ICD-10-CM | POA: Diagnosis present

## 2019-04-06 HISTORY — DX: Pneumonia due to coronavirus disease 2019: J12.82

## 2019-04-06 HISTORY — DX: COVID-19: U07.1

## 2019-04-06 LAB — C-REACTIVE PROTEIN: CRP: 2.1 mg/dL — ABNORMAL HIGH (ref <1.0)

## 2019-04-06 LAB — D-DIMER, QUANTITATIVE: D-Dimer, Quant: 0.33 ug/mL-FEU (ref 0.00–0.50)

## 2019-04-06 LAB — COMPREHENSIVE METABOLIC PANEL
ALT: 27 U/L (ref 0–44)
AST: 23 U/L (ref 15–41)
Albumin: 3.6 g/dL (ref 3.5–5.0)
Alkaline Phosphatase: 55 U/L (ref 38–126)
Anion gap: 9 (ref 5–15)
BUN: 18 mg/dL (ref 6–20)
CO2: 28 mmol/L (ref 22–32)
Calcium: 8.4 mg/dL — ABNORMAL LOW (ref 8.9–10.3)
Chloride: 102 mmol/L (ref 98–111)
Creatinine, Ser: 0.95 mg/dL (ref 0.61–1.24)
GFR calc Af Amer: >60 mL/min (ref >60)
GFR calc non Af Amer: >60 mL/min (ref >60)
Glucose, Bld: 132 mg/dL — ABNORMAL HIGH (ref 70–99)
Potassium: 4.1 mmol/L (ref 3.5–5.1)
Sodium: 139 mmol/L (ref 135–145)
Total Bilirubin: 0.7 mg/dL (ref 0.3–1.2)
Total Protein: 7 g/dL (ref 6.5–8.1)

## 2019-04-06 LAB — CBC
HCT: 49.9 % (ref 39.0–52.0)
Hemoglobin: 16.2 g/dL (ref 13.0–17.0)
MCH: 30.3 pg (ref 26.0–34.0)
MCHC: 32.5 g/dL (ref 30.0–36.0)
MCV: 93.4 fL (ref 80.0–100.0)
Platelets: 100 10*3/uL — ABNORMAL LOW (ref 150–400)
RBC: 5.34 MIL/uL (ref 4.22–5.81)
RDW: 13.2 % (ref 11.5–15.5)
WBC: 4.4 10*3/uL (ref 4.0–10.5)
nRBC: 0 % (ref 0.0–0.2)

## 2019-04-06 LAB — HIV ANTIBODY (ROUTINE TESTING W REFLEX): HIV Screen 4th Generation wRfx: NONREACTIVE

## 2019-04-06 LAB — BRAIN NATRIURETIC PEPTIDE: B Natriuretic Peptide: 14.8 pg/mL (ref 0.0–100.0)

## 2019-04-06 MED ORDER — ZINC SULFATE 220 (50 ZN) MG PO CAPS: 220.0000 mg | ORAL_CAPSULE | Freq: Every day | ORAL | 0 refills | Status: DC

## 2019-04-06 MED ORDER — ZINC SULFATE 220 (50 ZN) MG PO CAPS
220.0000 mg | ORAL_CAPSULE | Freq: Every day | ORAL | Status: DC
  Administered 2019-04-06 – 2019-04-07 (×2): 220 mg via ORAL
  Filled 2019-04-06 (×2): qty 1

## 2019-04-06 MED ORDER — HYDROCOD POLST-CPM POLST ER 10-8 MG/5ML PO SUER: 5.0000 mL | Freq: Two times a day (BID) | ORAL | 0 refills | Status: DC

## 2019-04-06 MED ORDER — ACETAMINOPHEN 325 MG PO TABS: 650.0000 mg | ORAL_TABLET | Freq: Four times a day (QID) | ORAL | Status: DC | PRN

## 2019-04-06 MED ORDER — TAB-A-VITE/IRON PO TABS
1.0000 | ORAL_TABLET | Freq: Every day | ORAL | Status: DC
  Administered 2019-04-06 – 2019-04-07 (×2): 1 via ORAL
  Filled 2019-04-06 (×2): qty 1

## 2019-04-06 MED ORDER — ALBUTEROL SULFATE HFA 108 (90 BASE) MCG/ACT IN AERS: 2.0000 | INHALATION_SPRAY | Freq: Four times a day (QID) | RESPIRATORY_TRACT | 0 refills | Status: DC | PRN

## 2019-04-06 MED ORDER — ACETAMINOPHEN 325 MG PO TABS: 650.0000 mg | ORAL_TABLET | Freq: Four times a day (QID) | ORAL | 2 refills | Status: DC | PRN

## 2019-04-06 MED ORDER — SODIUM CHLORIDE 0.9% FLUSH
3.0000 mL | Freq: Two times a day (BID) | INTRAVENOUS | Status: DC
  Administered 2019-04-06 – 2019-04-07 (×3): 3 mL via INTRAVENOUS

## 2019-04-06 MED ORDER — SODIUM CHLORIDE 0.9 % IV SOLN
200.0000 mg | Freq: Once | INTRAVENOUS | Status: DC
  Filled 2019-04-06: qty 40

## 2019-04-06 MED ORDER — SODIUM CHLORIDE 0.9 % IV SOLN
100.0000 mg | Freq: Every day | INTRAVENOUS | Status: DC
  Administered 2019-04-06 – 2019-04-07 (×2): 100 mg via INTRAVENOUS
  Filled 2019-04-06 (×2): qty 100

## 2019-04-06 MED ORDER — ENOXAPARIN SODIUM 60 MG/0.6ML ~~LOC~~ SOLN
60.0000 mg | SUBCUTANEOUS | Status: DC
  Administered 2019-04-06: 60 mg via SUBCUTANEOUS
  Filled 2019-04-06: qty 0.6

## 2019-04-06 MED ORDER — DEXAMETHASONE 4 MG PO TABS
6.0000 mg | ORAL_TABLET | Freq: Every day | ORAL | Status: DC
  Administered 2019-04-06 – 2019-04-07 (×2): 6 mg via ORAL
  Filled 2019-04-06 (×2): qty 2

## 2019-04-06 MED ORDER — ASCORBIC ACID 500 MG PO TABS
1000.0000 mg | ORAL_TABLET | Freq: Every day | ORAL | Status: DC
  Administered 2019-04-06 – 2019-04-07 (×2): 1000 mg via ORAL
  Filled 2019-04-06 (×2): qty 2

## 2019-04-06 MED ORDER — ASCORBIC ACID 500 MG PO TABS: 1000.0000 mg | ORAL_TABLET | Freq: Two times a day (BID) | ORAL | 0 refills | Status: DC

## 2019-04-06 NOTE — H&P (Signed)
History and Physical    Randall Rojas:[id number] DOB: [birth year] DOA: 04/06/2019  PCP: Shelle Iron, MD  Patient coming from: Home (transferred from [facility] health)  I have personally briefly reviewed patient's old medical records in [facility]  Chief Complaint: Cough, dyspnea  HPI: Randall Rojas is a 59 y.o. male with medical history significant of morbid obesity, hypertension (not on medications), former smoker quit 30 years ago, Asthma who presented as transfer from [facility] for COVID-19 pneumonia.  Complains of cough, dyspnea, fever ongoing for a week.  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  Denies abdominal pain, diarrhea, nausea or vomiting but has a poor appetite. No history of COVID-19 positive exposure.  ED Course: At [facility] initial vitals temperature 100 F, heart rate 97/min, respiratory rate 22/min, blood pressure 121/74.  He was placed on 2 L oxygen via nasal cannula for hypoxia.  Rapid RT-PCR test positive.  Chest x-ray negative for infiltrates.  Pertinent lab work include WBC count 5000, platelet 94,000.  Creatinine normal.  pH 7.4 PO2 62 on room air.  Received dexamethasone and remdesivir at [facility] ED and transferred here for further care due to lack of beds.  Review of Systems: As per HPI otherwise 10 point review of systems negative.   Past Medical History:  Diagnosis Date  . Asthma   . Chest pain 04/15/2015  . Coronary artery calcification seen on CT scan 04/15/2015  . Coronary artery disease   . Essential hypertension 04/15/2015  . Hyperlipidemia 04/15/2015  . Morbid obesity (HCC) 04/15/2015  . TIA (transient ischemic attack)     Past Surgical History:  Procedure Laterality Date  . CARDIAC CATHETERIZATION N/A 04/22/2015   Procedure: Left Heart Cath and Coronary Angiography;  Surgeon: Peter M Swaziland, MD;  Location: [facility] INVASIVE CV LAB;  Service: Cardiovascular;  Laterality: N/A;  . GALLBLADDER SURGERY      . SINUS SURGERY WITH INSTATRAK       reports that he quit smoking about 30 years ago. He has never used smokeless tobacco. No history on file for alcohol and drug.  Allergies  Allergen Reactions  . Cephalexin Nausea And Vomiting and Rash    Family History  Problem Relation Age of Onset  . Hypertension Mother   . Rheum arthritis Mother   . Cancer Father   . Heart attack Father   . Cancer Brother   . Hypertension Sister      Prior to Admission medications   Medication Sig Start Date End Date Taking? Authorizing Provider  acetaminophen (TYLENOL) 325 MG tablet Take 650 mg by mouth every 6 (six) hours as needed for moderate pain.   Yes [provider]    Physical Exam: Vitals:   04/06/19 0307  BP: 118/77  Pulse: 73  Resp: 18  Temp: 98.1 F (36.7 C)  TempSrc: Oral  SpO2: 97%  Weight: 120.7 kg  Height: 5\' 4"  (1.626 m)    Constitutional: NAD, calm, comfortable Vitals:   04/06/19 0307  BP: 118/77  Pulse: 73  Resp: 18  Temp: 98.1 F (36.7 C)  TempSrc: Oral  SpO2: 97%  Weight: 120.7 kg  Height: 5\' 4"  (1.626 m)   Eyes: PERRL, lids and conjunctivae normal ENMT: Mucous membranes are moist. Neck: normal, supple, no masses, no thyromegaly Respiratory: Decreased breath sounds bilaterally on anterior auscultation, no wheezing, no crackles. Normal respiratory effort. No accessory muscle use.  Cardiovascular: Regular rate and rhythm, no  murmurs. No extremity edema. Abdomen: no tenderness. No hepatosplenomegaly. Bowel sounds positive.  Musculoskeletal: no clubbing / cyanosis. No joint deformity upper and lower extremities. Good ROM, no contractures. Skin: no rashes, lesions, ulcers. No induration Neurologic: CN 2-12 grossly intact. Sensation intact, DTR normal. Strength 5/5 in all 4.  Psychiatric: Normal judgment and insight. Alert and oriented x 3. Normal mood.   Labs on Admission: I have personally reviewed following labs and imaging studies  CBC: No results  for input(s): WBC, NEUTROABS, HGB, HCT, MCV, PLT in the last 168 hours. Basic Metabolic Panel: No results for input(s): NA, K, CL, CO2, GLUCOSE, BUN, CREATININE, CALCIUM, MG, PHOS in the last 168 hours. GFR: CrCl cannot be calculated (Patient's most recent lab result is older than the maximum 21 days allowed.). Liver Function Tests: No results for input(s): AST, ALT, ALKPHOS, BILITOT, PROT, ALBUMIN in the last 168 hours. No results for input(s): LIPASE, AMYLASE in the last 168 hours. No results for input(s): AMMONIA in the last 168 hours. Coagulation Profile: No results for input(s): INR, PROTIME in the last 168 hours. Cardiac Enzymes: No results for input(s): CKTOTAL, CKMB, CKMBINDEX, TROPONINI in the last 168 hours. BNP (last 3 results) No results for input(s): PROBNP in the last 8760 hours. HbA1C: No results for input(s): HGBA1C in the last 72 hours. CBG: No results for input(s): GLUCAP in the last 168 hours. Lipid Profile: No results for input(s): CHOL, HDL, LDLCALC, TRIG, CHOLHDL, LDLDIRECT in the last 72 hours. Thyroid Function Tests: No results for input(s): TSH, T4TOTAL, FREET4, T3FREE, THYROIDAB in the last 72 hours. Anemia Panel: No results for input(s): VITAMINB12, FOLATE, FERRITIN, TIBC, IRON, RETICCTPCT in the last 72 hours. Urine analysis: No results found for: COLORURINE, APPEARANCEUR, LABSPEC, PHURINE, GLUCOSEU, HGBUR, BILIRUBINUR, KETONESUR, PROTEINUR, UROBILINOGEN, NITRITE, LEUKOCYTESUR  Radiological Exams on Admission: No results found.  EKG: Independently reviewed.   Assessment/Plan Active Problems:   Obesity, Class III, BMI 40-49.9 (morbid obesity) (HCC)   Pneumonia due to COVID-19 virus  COVID-19 pneumonia Acute hypoxic respiratory failure requiring 2 L of oxygen via nasal cannula Chest x-ray negative.  Procalcitonin negative.  Troponins negative.  No leukocytosis. Continue remdesivir, dexamethasone, multivitamins Trend inflammation  markers  Hypertension Asthma Controlled Not on any home medications  DVT prophylaxis: Lovenox SQ Code Status: Full code  Family Communication: Discussed plan with patient Disposition Plan: Home when stable Consults called: None Admission status: Inpatient   Lucky Cowboy MD [facility]  If 7PM-7AM, please contact night-coverage 04/06/2019, 7:09 AM

## 2019-04-06 NOTE — Progress Notes (Signed)
PROGRESS NOTE  JP EASTHAM VHQ:[id number] DOB: 1957-09-02   PCP: Shelle Iron, MD  Patient is from: Home  DOA: 04/06/2019 LOS: 0  Brief Narrative / Interim history: 59 year old male with history of morbid obesity, HTN and asthma transferred from [facility] after he presented today with cough, dyspnea, fever and poor appetite for about 1 week.  Tested positive for COVID-19.   Reportedly mild temp to 100F. Chest x-ray without acute finding.  Started on 2 L, remdesivir and Decadron, and transferred here for further care for COVID-19 infection.  CMP, CBC, D-dimer, BNP and HIV with no significant finding other than glucose to 132 on platelet to 100.  Subjective: No major events overnight of this morning.  Continues to endorse dry cough and weakness.  No GI or UTI symptoms.  Still with poor appetite.  Saturating in upper 90s on 2 L.  Objective: Vitals:   04/06/19 0307 04/06/19 0713 04/06/19 1135 04/06/19 1218  BP: 118/77 118/84 131/82   Pulse: 73 70 84 82  Resp: 18 20    Temp: 98.1 F (36.7 C) 98 F (36.7 C) 98.1 F (36.7 C)   TempSrc: Oral Oral Oral   SpO2: 97% 96% 97% 97%  Weight: 120.7 kg     Height: 5\' 4"  (1.626 m)       Intake/Output Summary (Last 24 hours) at 04/06/2019 1312 Last data filed at 04/06/2019 0708 Gross per 24 hour  Intake --  Output 475 ml  Net -475 ml   Filed Weights   04/06/19 0307  Weight: 120.7 kg    Examination:  GENERAL: No acute distress.  Appears well.  HEENT: MMM.  Vision and hearing grossly intact.  NECK: Supple.  No apparent JVD.  RESP: 97% on 2 L.  No high WOB.  Fair aeration bilaterally. CVS:  RRR. Heart sounds normal.  ABD/GI/GU: Bowel sounds present. Soft. Non tender.  MSK/EXT:  Moves extremities. No apparent deformity. No edema.  SKIN: no apparent skin lesion or wound NEURO: Awake, alert and oriented appropriately.  No apparent focal neuro deficit. PSYCH: Calm. Normal affect.   Procedures:  None  Assessment &  Plan: COVID-19 infection: symptomatic for about a week.  Tested positive on 1/9 at [facility].  No documented desaturation but started on 2 L by [place].  Now 96% on 2 L.  CXR without infiltrate but I have no access to this. Recent Labs    04/06/19 0807  DDIMER 0.33  -Check CRP and portable CXR. -Verbally consented for Actemra after risk-benefit and off label use discussion. -Decadron and remdesivir 1/9>> -Subcu Lovenox for VTE prophylaxis. -Supportive care with inhalers, mucolytic/antitussive, vitamins and incentive spirometry. -OOB, wean oxygen as able, and ambulate.   Steroid-induced hyperglycemia:  -Continue monitoring with daily BMP. -Check hemoglobin A1c  Asthma exacerbation due to COVID-19 infection -Steroid and breathing treatments as above.  Essential hypertension?  Not on medication at home.  Normotensive here. -Continue monitoring  Morbid obesity: BMI 45.66. -Encourage lifestyle change to lose weight.  Thrombocytopenia: Platelet 100.  Unknown baseline. -Continue monitoring               DVT prophylaxis: Subcu Lovenox Code Status: Full code-confirmed with patient. Family Communication: Attempted to call patient's wife over the phone but no answer. Disposition Plan: Anticipate discharge home 1/11.  Can complete remdesivir infusion as GVC. Consultants: None   Microbiology summarized: 1/9-COVID-19 positive.  Sch Meds:  Scheduled Meds: . vitamin C  1,000 mg Oral Daily  . dexamethasone  6 mg  Oral Daily  . enoxaparin (LOVENOX) injection  60 mg Subcutaneous Q24H  . multivitamins with iron  1 tablet Oral Daily  . sodium chloride flush  3 mL Intravenous Q12H  . zinc sulfate  220 mg Oral Daily   Continuous Infusions: . remdesivir 200 mg in sodium chloride 0.9% 250 mL IVPB     Followed by  . remdesivir 100 mg in NS 100 mL 100 mg (04/06/19 0954)   PRN Meds:.acetaminophen  Antimicrobials: Anti-infectives (From admission, onward)   Start     Dose/Rate Route Frequency  Ordered Stop   04/06/19 1000  remdesivir 100 mg in sodium chloride 0.9 % 100 mL IVPB     100 mg 200 mL/hr over 30 Minutes Intravenous Daily 04/06/19 0512 04/10/19 0959   04/06/19 0515  remdesivir 200 mg in sodium chloride 0.9% 250 mL IVPB     200 mg 580 mL/hr over 30 Minutes Intravenous Once 04/06/19 8841         I have personally reviewed the following labs and images: CBC: Recent Labs  Lab 04/06/19 0654  WBC 4.4  HGB 16.2  HCT 49.9  MCV 93.4  PLT 100*   BMP &GFR Recent Labs  Lab 04/06/19 0654  NA 139  K 4.1  CL 102  CO2 28  GLUCOSE 132*  BUN 18  CREATININE 0.95  CALCIUM 8.4*   Estimated Creatinine Clearance: 100.5 mL/min (by C-G formula based on SCr of 0.95 mg/dL). Liver & Pancreas: Recent Labs  Lab 04/06/19 0654  AST 23  ALT 27  ALKPHOS 55  BILITOT 0.7  PROT 7.0  ALBUMIN 3.6   No results for input(s): LIPASE, AMYLASE in the last 168 hours. No results for input(s): AMMONIA in the last 168 hours. Diabetic: No results for input(s): HGBA1C in the last 72 hours. No results for input(s): GLUCAP in the last 168 hours. Cardiac Enzymes: No results for input(s): CKTOTAL, CKMB, CKMBINDEX, TROPONINI in the last 168 hours. No results for input(s): PROBNP in the last 8760 hours. Coagulation Profile: No results for input(s): INR, PROTIME in the last 168 hours. Thyroid Function Tests: No results for input(s): TSH, T4TOTAL, FREET4, T3FREE, THYROIDAB in the last 72 hours. Lipid Profile: No results for input(s): CHOL, HDL, LDLCALC, TRIG, CHOLHDL, LDLDIRECT in the last 72 hours. Anemia Panel: No results for input(s): VITAMINB12, FOLATE, FERRITIN, TIBC, IRON, RETICCTPCT in the last 72 hours. Urine analysis: No results found for: COLORURINE, APPEARANCEUR, LABSPEC, PHURINE, GLUCOSEU, HGBUR, BILIRUBINUR, KETONESUR, PROTEINUR, UROBILINOGEN, NITRITE, LEUKOCYTESUR Sepsis Labs: Invalid input(s): PROCALCITONIN, [place]  Microbiology: No results found for this or any  previous visit (from the past 240 hour(s)).  Radiology Studies: No results found.  20 minutes with more than 50% spent in reviewing records, counseling patient/family and coordinating care.   Rayne Cowdrey T. [place]  If 7PM-7AM, please contact night-coverage [email or web address] Password [email or web address] 04/06/2019, 1:12 PM

## 2019-04-06 NOTE — Progress Notes (Signed)
SATURATION QUALIFICATIONS: (This note is used to comply with regulatory documentation for home oxygen)  Patient Saturations on Room Air at Rest = 96%  Patient Saturations on Room Air while Ambulating = 91%  

## 2019-04-07 DIAGNOSIS — J4521 Mild intermittent asthma with (acute) exacerbation: Secondary | ICD-10-CM

## 2019-04-07 DIAGNOSIS — R7303 Prediabetes: Secondary | ICD-10-CM

## 2019-04-07 DIAGNOSIS — D696 Thrombocytopenia, unspecified: Secondary | ICD-10-CM

## 2019-04-07 DIAGNOSIS — R739 Hyperglycemia, unspecified: Secondary | ICD-10-CM

## 2019-04-07 DIAGNOSIS — T380X5A Adverse effect of glucocorticoids and synthetic analogues, initial encounter: Secondary | ICD-10-CM

## 2019-04-07 DIAGNOSIS — J1282 Pneumonia due to coronavirus disease 2019: Secondary | ICD-10-CM

## 2019-04-07 DIAGNOSIS — U071 COVID-19: Principal | ICD-10-CM

## 2019-04-07 LAB — CBC WITH DIFFERENTIAL/PLATELET
Abs Immature Granulocytes: 0.03 K/uL (ref 0.00–0.07)
Basophils Absolute: 0 K/uL (ref 0.0–0.1)
Basophils Relative: 0 %
Eosinophils Absolute: 0 K/uL (ref 0.0–0.5)
Eosinophils Relative: 0 %
HCT: 48.1 % (ref 39.0–52.0)
Hemoglobin: 15.5 g/dL (ref 13.0–17.0)
Immature Granulocytes: 1 %
Lymphocytes Relative: 30 %
Lymphs Abs: 1.7 K/uL (ref 0.7–4.0)
MCH: 30.4 pg (ref 26.0–34.0)
MCHC: 32.2 g/dL (ref 30.0–36.0)
MCV: 94.3 fL (ref 80.0–100.0)
Monocytes Absolute: 0.5 K/uL (ref 0.1–1.0)
Monocytes Relative: 8 %
Neutro Abs: 3.6 K/uL (ref 1.7–7.7)
Neutrophils Relative %: 61 %
Platelets: 115 K/uL — ABNORMAL LOW (ref 150–400)
RBC: 5.1 MIL/uL (ref 4.22–5.81)
RDW: 13.2 % (ref 11.5–15.5)
WBC: 5.8 K/uL (ref 4.0–10.5)
nRBC: 0 % (ref 0.0–0.2)

## 2019-04-07 LAB — D-DIMER, QUANTITATIVE: D-Dimer, Quant: 0.28 ug{FEU}/mL (ref 0.00–0.50)

## 2019-04-07 LAB — COMPREHENSIVE METABOLIC PANEL WITH GFR
ALT: 26 U/L (ref 0–44)
AST: 19 U/L (ref 15–41)
Albumin: 3.4 g/dL — ABNORMAL LOW (ref 3.5–5.0)
Alkaline Phosphatase: 49 U/L (ref 38–126)
Anion gap: 11 (ref 5–15)
BUN: 23 mg/dL — ABNORMAL HIGH (ref 6–20)
CO2: 28 mmol/L (ref 22–32)
Calcium: 8.3 mg/dL — ABNORMAL LOW (ref 8.9–10.3)
Chloride: 102 mmol/L (ref 98–111)
Creatinine, Ser: 1.04 mg/dL (ref 0.61–1.24)
GFR calc Af Amer: >60 mL/min
GFR calc non Af Amer: >60 mL/min
Glucose, Bld: 134 mg/dL — ABNORMAL HIGH (ref 70–99)
Potassium: 4.3 mmol/L (ref 3.5–5.1)
Sodium: 141 mmol/L (ref 135–145)
Total Bilirubin: 0.8 mg/dL (ref 0.3–1.2)
Total Protein: 6.5 g/dL (ref 6.5–8.1)

## 2019-04-07 LAB — LIPID PANEL
Cholesterol: 131 mg/dL (ref 0–200)
HDL: 21 mg/dL — ABNORMAL LOW
LDL Cholesterol: 94 mg/dL (ref 0–99)
Total CHOL/HDL Ratio: 6.2 ratio
Triglycerides: 81 mg/dL
VLDL: 16 mg/dL (ref 0–40)

## 2019-04-07 LAB — HEMOGLOBIN A1C
Hgb A1c MFr Bld: 5.9 % — ABNORMAL HIGH (ref 4.8–5.6)
Mean Plasma Glucose: 122.63 mg/dL

## 2019-04-07 LAB — C-REACTIVE PROTEIN: CRP: 0.9 mg/dL

## 2019-04-07 NOTE — Progress Notes (Signed)
Pt discharging home today per Dr. Alanda Slim. Pt's IV site D/C'd and WDL. Pt's VSS. Pt provided with home medication list, discharge instructions and prescriptions. Verbalized understanding. Pt is currently awaiting arrival of family for transport home.

## 2019-04-07 NOTE — Discharge Instructions (Signed)
You are scheduled for an outpatient infusion of Remdesivir at 100PM on Tuesday 1/12 and Wednesday 1/13   Please report to [facility] at [address].  Drive to the security guard and tell them you are here for an infusion. They will direct you to the front entrance where we will come and get you.  For questions call [phone].  Thanks

## 2019-04-07 NOTE — Progress Notes (Signed)
Patient scheduled for outpatient Remdesivir infusion at 100PM on Tuesday 1/12 and Wednesday 1/13  Please advise them to report to [facility] at [address].  Drive to the security guard and tell them you are here for an infusion. They will direct you to the front entrance where we will come and get you.  For questions call [phone].  Thanks

## 2019-04-07 NOTE — Discharge Summary (Signed)
Physician Discharge Summary  Randall Rojas XHB:[id number] DOB: [birth year] DOA: 04/06/2019  PCP: Shelle Iron, MD  Admit date: 04/06/2019 Discharge date: 04/07/2019  Admitted From: Home Disposition: Home  Recommendations for Outpatient Follow-up:  1. Follow [facility] for remdesivir infusion on 1/12 and 1/13 at 1 PM.  2. Follow-up with PCP in 1 to 2 weeks 3. Please obtain CBC/BMP/Mag at follow up 4. Please follow up on the following pending results: None  Home Health: None required Equipment/Devices: None required  Discharge Condition: Stable CODE STATUS: Full code  Follow-up Information    Sistasis, Jim, MD. Schedule an appointment as soon as possible for a visit in 2 week(s).   Specialty: Family Medicine Contact information: [address] [place] [address] [phone]           Hospital Course: 59 year old male with history of morbid obesity, HTN and asthma transferred from [facility] after he presented today with cough, dyspnea, fever and poor appetite for about 1 week.  Tested positive for COVID-19.   Reportedly mild temp to 100F. Chest x-ray without acute finding.  Started on 2 L, remdesivir and Decadron, and transferred here for further care for COVID-19 infection.  At [facility] long hospital, hemodynamically stable.  CMP, CBC, D-dimer, BNP and HIV with no significant finding other than glucose to 132 on platelet to 100.  CXR with worsening mild hazy patchy opacities in the mid to lower lungs bilaterally compatible with COVID-19 pneumonia.  Patient was continued on remdesivir, Decadron and supportive care with significant improvement in his symptoms.  Inflammatory markers normalized.  Ambulated on room air maintain saturation greater than 91%.  Felt well and ready to go home.  Patient will complete remdesivir infusion for 2 more days to [facility] on 1/12 and 1/13.  Patient was counseled on infection prevention and return precautions as below.  See individual  problems below for more on hospital course.  Discharge Diagnoses:  COVID-19 infection: symptomatic for about a week.  Tested positive on 1/9 at [facility].  No documented desaturation but started on 2 L by [place].   Initial CXR at [facility] reportedly negative. Repeat CXR here with patchy opacity concerning for Covid pneumonia.  Inflammatory markers normalized.  Ambulated and maintained saturation greater than 91% on room air.  Feels better and ready to go home. Recent Labs    04/06/19 0807 04/06/19 1420 04/07/19 0334  DDIMER 0.33  --  0.28  CRP  --  2.1* 0.9  -Decadron and remdesivir 1/9-1/11.  Patient to complete remdesivir infusion at [facility] -Was provided with prescription for albuterol and antitussive -Counseled on infection prevention return precautions.   Steroid-induced hyperglycemia:  A1c 5.9%. -Encourage lifestyle change  Asthma exacerbation due to COVID-19 infection: Exacerbation resolved. -Discharged on as needed albuterol.  Essential hypertension?  Not on medication at home.  Normotensive here. -Continue monitoring  Morbid obesity: BMI 45.66. -Encourage lifestyle change to lose weight.  Thrombocytopenia: Platelet 100> 115.  Unknown baseline.  Improved. -Recheck CBC at follow-up.   Discharge Instructions  Discharge Instructions    MyChart COVID-19 home monitoring program   Complete by: Apr 06, 2019    Is the patient willing to use the MyChart Mobile App for home monitoring?: No   Diet general   Complete by: As directed    Discharge instructions   Complete by: As directed    It has been a pleasure taking care of you! You were hospitalized and treated for COVID-19 infection. You will receive more treatment  of remdesivir infusion at the [facility] infusion center (former [facility]) for the next 2 days.     Once your infusion is scheduled, please report to [facility] at [address].  Drive to the security guard and tell them  you are here for an infusion. They will direct you to the front entrance where we will come and get you.  For questions call [phone].   You are potentially infectious for the next 10 days. We recommend you isolate yourself and take the necessary precautions to prevent the virus from spreading.  Some of the steps to prevent the virus from spreading to others: Stay away from other and members of your household at least for 10-15 days. Let healthy household members care for children and pets, if possible. If you have to care for children or pets, wash your hands often and wear a mask. If possible, stay in your own room, separate from others. Use a different bathroom.Make sure that all people in your household wash their hands well and often. Leave your house only to seek medical care. Do not use public transport. Do not travel while you are sick. Wash your hands often with soap and water for 20 seconds. If soap and water are not available, use alcohol-based hand sanitizer. Cough or sneeze into a tissue or your sleeve or elbow. Do not cough or sneeze into your hand or into the air. Wear a cloth face covering or face mask.  Return precautions: Get help or return to the hospital right away if: You have trouble breathing. You have pain or pressure in your chest. You have confusion. You have bluish lips and fingernails. You have difficulty waking from sleep. You have symptoms that get worse. These symptoms may represent a serious problem that is an emergency. Do not wait to see if the symptoms will go away. Get medical help right away. Call your local emergency services (911 in the [place] Do not drive yourself to the hospital. Let the emergency medical personnel know if you think you have COVID-19.  To protect yourself in the future:  Do not travel to areas where COVID-19 is a risk. The areas where COVID-19 is reported change often. To identify high-risk areas and travel restrictions, check the  CDC travel website: StageSync.si If you live in, or must travel to, an area where COVID-19 is a risk, take precautions to avoid infection. Stay away from people who are sick. Wash your hands often with soap and water for 20 seconds. If soap and water are not available, use an alcohol-based hand sanitizer. Avoid touching your mouth, face, eyes, or nose. Avoid going out in public, follow guidance from your state and local health authorities. If you must go out in public, wear a cloth face covering or face mask. Disinfect objects and surfaces that are frequently touched every day. This may include: Counters and tables. Doorknobs and light switches. Sinks and faucets. Electronics, such as phones, remote controls, keyboards, computers, and tablets.    Where to find more information Centers for Disease Control and Prevention: StickerEmporium.tn World Health Organization: https://thompson-craig.com/   Increase activity slowly   Complete by: As directed      Allergies as of 04/07/2019      Reactions   Cephalexin Nausea And Vomiting, Rash      Medication List    TAKE these medications   acetaminophen 325 MG tablet Commonly known as: Tylenol Take 2 tablets (650 mg total) by mouth every  6 (six) hours as needed for mild pain, fever or headache. What changed: reasons to take this   albuterol 108 (90 Base) MCG/ACT inhaler Commonly known as: VENTOLIN HFA Inhale 2 puffs into the lungs every 6 (six) hours as needed for wheezing or shortness of breath.   ascorbic acid 500 MG tablet Commonly known as: VITAMIN C Take 2 tablets (1,000 mg total) by mouth 2 (two) times daily.   chlorpheniramine-HYDROcodone 10-8 MG/5ML Suer Commonly known as: Tussionex Pennkinetic ER Take 5 mLs by mouth 2 (two) times daily.   zinc sulfate 220 (50 Zn) MG capsule Take 1 capsule (220 mg total) by mouth daily.        Consultations:  None  Procedures/Studies   CXR portable  Result Date: 04/06/2019 CLINICAL DATA:  COVID-19 positive.  Cough.  Dyspnea.  Fever. EXAM: PORTABLE CHEST 1 VIEW COMPARISON:  Chest radiograph from one day prior. FINDINGS: Stable cardiomediastinal silhouette with normal heart size. No pneumothorax. No pleural effusion. Hazy mild patchy opacities in peripheral mid lungs and basilar lungs bilaterally, slightly worsened. IMPRESSION: Slightly worsened mild hazy patchy opacities in the mid to lower lungs bilaterally, compatible with COVID-19 pneumonia. Electronically Signed   By: Ilona Sorrel M.D.   On: 04/06/2019 16:18       Discharge Exam: Vitals:   04/07/19 0440 04/07/19 0604  BP: (!) 88/56 103/71  Pulse: (!) 54 69  Resp: 18 16  Temp: 97.9 F (36.6 C)   SpO2: (!) 89% 93%    GENERAL: No acute distress.  Appears well.  HEENT: MMM.  Vision and hearing grossly intact.  NECK: Supple.  No apparent JVD.  RESP:  No IWOB.  Fair aeration bilaterally. CVS:  RRR. Heart sounds normal.  ABD/GI/GU: Bowel sounds present. Soft. Non tender.  MSK/EXT:  Moves extremities. No apparent deformity or edema.  SKIN: no apparent skin lesion or wound NEURO: Awake, alert and oriented appropriately.  No apparent focal neuro deficit. PSYCH: Calm. Normal affect.    The results of significant diagnostics from this hospitalization (including imaging, microbiology, ancillary and laboratory) are listed below for reference.     Microbiology: No results found for this or any previous visit (from the past 240 hour(s)).   Labs: BNP (last 3 results) Recent Labs    04/06/19 0807  BNP 23.7   Basic Metabolic Panel: Recent Labs  Lab 04/06/19 0654 04/07/19 0334  NA 139 141  K 4.1 4.3  CL 102 102  CO2 28 28  GLUCOSE 132* 134*  BUN 18 23*  CREATININE 0.95 1.04  CALCIUM 8.4* 8.3*   Liver Function Tests: Recent Labs  Lab 04/06/19 0654 04/07/19 0334  AST 23 19  ALT 27 26  ALKPHOS 55  49  BILITOT 0.7 0.8  PROT 7.0 6.5  ALBUMIN 3.6 3.4*   No results for input(s): LIPASE, AMYLASE in the last 168 hours. No results for input(s): AMMONIA in the last 168 hours. CBC: Recent Labs  Lab 04/06/19 0654 04/07/19 0334  WBC 4.4 5.8  NEUTROABS  --  3.6  HGB 16.2 15.5  HCT 49.9 48.1  MCV 93.4 94.3  PLT 100* 115*   Cardiac Enzymes: No results for input(s): CKTOTAL, CKMB, CKMBINDEX, TROPONINI in the last 168 hours. BNP: Invalid input(s): POCBNP CBG: No results for input(s): GLUCAP in the last 168 hours. D-Dimer Recent Labs    04/06/19 0807 04/07/19 0334  DDIMER 0.33 0.28   Hgb A1c Recent Labs    04/07/19 0334  HGBA1C 5.9*   Lipid  Profile Recent Labs    04/07/19 0334  CHOL 131  HDL 21*  LDLCALC 94  TRIG 81  CHOLHDL 6.2   Thyroid function studies No results for input(s): TSH, T4TOTAL, T3FREE, THYROIDAB in the last 72 hours.  Invalid input(s): FREET3 Anemia work up No results for input(s): VITAMINB12, FOLATE, FERRITIN, TIBC, IRON, RETICCTPCT in the last 72 hours. Urinalysis No results found for: COLORURINE, APPEARANCEUR, LABSPEC, PHURINE, GLUCOSEU, HGBUR, BILIRUBINUR, KETONESUR, PROTEINUR, UROBILINOGEN, NITRITE, LEUKOCYTESUR Sepsis Labs Invalid input(s): PROCALCITONIN,  WBC,  LACTICIDVEN   Time coordinating discharge: 25 minutes  SIGNED:  Almon Hercules, MD  [facility] 04/07/2019, 2:49 PM  If 7PM-7AM, please contact night-coverage [email or web address] Password [email or web address]

## 2019-04-07 NOTE — Plan of Care (Signed)
  Problem: Education: Goal: Knowledge of General Education information will improve Description: Including pain rating scale, medication(s)/side effects and non-pharmacologic comfort measures Outcome: Completed/Met   Problem: Health Behavior/Discharge Planning: Goal: Ability to manage health-related needs will improve Outcome: Completed/Met   Problem: Clinical Measurements: Goal: Ability to maintain clinical measurements within normal limits will improve Outcome: Completed/Met Goal: Will remain free from infection Outcome: Completed/Met Goal: Diagnostic test results will improve Outcome: Completed/Met Goal: Respiratory complications will improve Outcome: Completed/Met Goal: Cardiovascular complication will be avoided Outcome: Completed/Met   Problem: Activity: Goal: Risk for activity intolerance will decrease Outcome: Completed/Met   Problem: Nutrition: Goal: Adequate nutrition will be maintained Outcome: Completed/Met   Problem: Coping: Goal: Level of anxiety will decrease Outcome: Completed/Met   Problem: Elimination: Goal: Will not experience complications related to bowel motility Outcome: Completed/Met Goal: Will not experience complications related to urinary retention Outcome: Completed/Met   Problem: Safety: Goal: Ability to remain free from injury will improve Outcome: Completed/Met   Problem: Skin Integrity: Goal: Risk for impaired skin integrity will decrease Outcome: Completed/Met   Problem: Education: Goal: Knowledge of risk factors and measures for prevention of condition will improve Outcome: Completed/Met   Problem: Coping: Goal: Psychosocial and spiritual needs will be supported Outcome: Completed/Met   Problem: Respiratory: Goal: Will maintain a patent airway Outcome: Completed/Met Goal: Complications related to the disease process, condition or treatment will be avoided or minimized Outcome: Completed/Met

## 2019-04-08 ENCOUNTER — Ambulatory Visit (HOSPITAL_COMMUNITY)
Admission: RE | Admit: 2019-04-08 | Discharge: 2019-04-08 | Disposition: A | Discharge status: Home / Self Care | Payer: Commercial Managed Care - PPO | Source: Ambulatory Visit | Attending: Pulmonary Disease | Admitting: Pulmonary Disease

## 2019-04-08 VITALS — BP 127/86 | HR 67 | Temp 98.3°F | Resp 18

## 2019-04-08 DIAGNOSIS — J1282 Pneumonia due to coronavirus disease 2019: Secondary | ICD-10-CM | POA: Diagnosis present

## 2019-04-08 DIAGNOSIS — U071 COVID-19: Secondary | ICD-10-CM | POA: Diagnosis not present

## 2019-04-08 MED ORDER — METHYLPREDNISOLONE SODIUM SUCC 125 MG IJ SOLR: 125.0000 mg | Freq: Once | INTRAMUSCULAR | Status: DC | PRN

## 2019-04-08 MED ORDER — ALBUTEROL SULFATE HFA 108 (90 BASE) MCG/ACT IN AERS: 2.0000 | INHALATION_SPRAY | Freq: Once | RESPIRATORY_TRACT | Status: DC | PRN

## 2019-04-08 MED ORDER — EPINEPHRINE 0.3 MG/0.3ML IJ SOAJ: 0.3000 mg | Freq: Once | INTRAMUSCULAR | Status: DC | PRN

## 2019-04-08 MED ORDER — FAMOTIDINE IN NACL 20-0.9 MG/50ML-% IV SOLN: 20.0000 mg | Freq: Once | INTRAVENOUS | Status: DC | PRN

## 2019-04-08 MED ORDER — SODIUM CHLORIDE 0.9 % IV SOLN: INTRAVENOUS | Status: DC | PRN

## 2019-04-08 MED ORDER — SODIUM CHLORIDE 0.9 % IV SOLN: 100.0000 mg | Freq: Once | INTRAVENOUS | Status: AC

## 2019-04-08 MED ORDER — SODIUM CHLORIDE 0.9 % IV SOLN
INTRAVENOUS | Status: AC
  Filled 2019-04-08: qty 20

## 2019-04-08 MED ORDER — DIPHENHYDRAMINE HCL 50 MG/ML IJ SOLN: 50.0000 mg | Freq: Once | INTRAMUSCULAR | Status: DC | PRN

## 2019-04-08 NOTE — Discharge Instructions (Signed)
10 Things You Can Do to Manage Your COVID-19 Symptoms at Home If you have possible or confirmed COVID-19: 1. Stay home from work and school. And stay away from other public places. If you must go out, avoid using any kind of public transportation, ridesharing, or taxis. 2. Monitor your symptoms carefully. If your symptoms get worse, call your healthcare provider immediately. 3. Get rest and stay hydrated. 4. If you have a medical appointment, call the healthcare provider ahead of time and tell them that you have or may have COVID-19. 5. For medical emergencies, call 911 and notify the dispatch personnel that you have or may have COVID-19. 6. Cover your cough and sneezes with a tissue or use the inside of your elbow. 7. Wash your hands often with soap and water for at least 20 seconds or clean your hands with an alcohol-based hand sanitizer that contains at least 60% alcohol. 8. As much as possible, stay in a specific room and away from other people in your home. Also, you should use a separate bathroom, if available. If you need to be around other people in or outside of the home, wear a mask. 9. Avoid sharing personal items with other people in your household, like dishes, towels, and bedding. 10. Clean all surfaces that are touched often, like counters, tabletops, and doorknobs. Use household cleaning sprays or wipes according to the label instructions. cdc.gov/coronavirus 09/25/2018 This information is not intended to replace advice given to you by your health care provider. Make sure you discuss any questions you have with your health care provider. Document Revised: 02/27/2019 Document Reviewed: 02/27/2019 Elsevier Patient Education  2020 Elsevier Inc.  

## 2019-04-08 NOTE — Progress Notes (Signed)
  Diagnosis: COVID-19   Physician: Dr. Wright  Procedure: [facility]fusion [facility] Med: remdesivir infusion.  Complications: No immediate complications noted.  Discharge: Discharged home   Randall Rojas 04/08/2019   

## 2019-04-09 ENCOUNTER — Ambulatory Visit (HOSPITAL_COMMUNITY)
Admit: 2019-04-09 | Discharge: 2019-04-09 | Disposition: A | Discharge status: Home / Self Care | Payer: Commercial Managed Care - PPO | Attending: Pulmonary Disease | Admitting: Pulmonary Disease

## 2019-04-09 DIAGNOSIS — U071 COVID-19: Secondary | ICD-10-CM | POA: Diagnosis not present

## 2019-04-09 DIAGNOSIS — J1282 Pneumonia due to coronavirus disease 2019: Secondary | ICD-10-CM

## 2019-04-09 MED ORDER — EPINEPHRINE 0.3 MG/0.3ML IJ SOAJ: 0.3000 mg | Freq: Once | INTRAMUSCULAR | Status: DC | PRN

## 2019-04-09 MED ORDER — SODIUM CHLORIDE 0.9 % IV SOLN: INTRAVENOUS | Status: DC | PRN

## 2019-04-09 MED ORDER — SODIUM CHLORIDE 0.9 % IV SOLN
100.0000 mg | Freq: Once | INTRAVENOUS | Status: AC
  Administered 2019-04-09: 100 mg via INTRAVENOUS

## 2019-04-09 MED ORDER — SODIUM CHLORIDE 0.9 % IV SOLN
INTRAVENOUS | Status: AC
  Filled 2019-04-09: qty 20

## 2019-04-09 MED ORDER — METHYLPREDNISOLONE SODIUM SUCC 125 MG IJ SOLR: 125.0000 mg | Freq: Once | INTRAMUSCULAR | Status: DC | PRN

## 2019-04-09 MED ORDER — FAMOTIDINE IN NACL 20-0.9 MG/50ML-% IV SOLN: 20.0000 mg | Freq: Once | INTRAVENOUS | Status: DC | PRN

## 2019-04-09 MED ORDER — DIPHENHYDRAMINE HCL 50 MG/ML IJ SOLN: 50.0000 mg | Freq: Once | INTRAMUSCULAR | Status: DC | PRN

## 2019-04-09 MED ORDER — ALBUTEROL SULFATE HFA 108 (90 BASE) MCG/ACT IN AERS: 2.0000 | INHALATION_SPRAY | Freq: Once | RESPIRATORY_TRACT | Status: DC | PRN

## 2019-04-09 NOTE — Discharge Instructions (Signed)
10 Things You Can Do to Manage Your COVID-19 Symptoms at Home If you have possible or confirmed COVID-19: 1. Stay home from work and school. And stay away from other public places. If you must go out, avoid using any kind of public transportation, ridesharing, or taxis. 2. Monitor your symptoms carefully. If your symptoms get worse, call your healthcare provider immediately. 3. Get rest and stay hydrated. 4. If you have a medical appointment, call the healthcare provider ahead of time and tell them that you have or may have COVID-19. 5. For medical emergencies, call 911 and notify the dispatch personnel that you have or may have COVID-19. 6. Cover your cough and sneezes with a tissue or use the inside of your elbow. 7. Wash your hands often with soap and water for at least 20 seconds or clean your hands with an alcohol-based hand sanitizer that contains at least 60% alcohol. 8. As much as possible, stay in a specific room and away from other people in your home. Also, you should use a separate bathroom, if available. If you need to be around other people in or outside of the home, wear a mask. 9. Avoid sharing personal items with other people in your household, like dishes, towels, and bedding. 10. Clean all surfaces that are touched often, like counters, tabletops, and doorknobs. Use household cleaning sprays or wipes according to the label instructions. cdc.gov/coronavirus 09/25/2018 This information is not intended to replace advice given to you by your health care provider. Make sure you discuss any questions you have with your health care provider. Document Revised: 02/27/2019 Document Reviewed: 02/27/2019 Elsevier Patient Education  2020 Elsevier Inc.  

## 2019-04-09 NOTE — Progress Notes (Signed)
  Diagnosis: COVID-19  Physician: Dr. Wright  Procedure: [facility] Infusion Clinic Med: bamlanivimab infusion - Provided patient with bamlanimivab fact sheet for patients, parents and caregivers prior to infusion.  Complications: No immediate complications noted.  Discharge: Discharged home   Randall Rojas [name] 04/09/2019  

## 2019-05-27 DIAGNOSIS — K219 Gastro-esophageal reflux disease without esophagitis: Secondary | ICD-10-CM

## 2019-05-27 HISTORY — DX: Gastro-esophageal reflux disease without esophagitis: K21.9

## 2019-06-02 ENCOUNTER — Other Ambulatory Visit: Payer: Self-pay

## 2019-06-02 ENCOUNTER — Encounter: Payer: Self-pay | Admitting: Physician Assistant

## 2019-06-02 ENCOUNTER — Ambulatory Visit (INDEPENDENT_AMBULATORY_CARE_PROVIDER_SITE_OTHER): Payer: Commercial Managed Care - PPO | Admitting: Physician Assistant

## 2019-06-02 VITALS — BP 120/80 | HR 78 | Temp 96.2°F | Resp 16 | Wt 279.0 lb

## 2019-06-02 DIAGNOSIS — I2584 Coronary atherosclerosis due to calcified coronary lesion: Secondary | ICD-10-CM

## 2019-06-02 DIAGNOSIS — E782 Mixed hyperlipidemia: Secondary | ICD-10-CM | POA: Diagnosis not present

## 2019-06-02 DIAGNOSIS — I2510 Atherosclerotic heart disease of native coronary artery without angina pectoris: Secondary | ICD-10-CM

## 2019-06-02 DIAGNOSIS — Z125 Encounter for screening for malignant neoplasm of prostate: Secondary | ICD-10-CM

## 2019-06-02 HISTORY — DX: Encounter for screening for malignant neoplasm of prostate: Z12.5

## 2019-06-02 NOTE — Progress Notes (Signed)
Established Patient Office Visit  Subjective:  Patient ID: Randall Rojas, male    DOB: 1960/10/30  Age: 59 y.o. MRN: [id number]  CC:  Chief Complaint  Patient presents with  . Follow-up    HPI JJ ENYEART presents for follow up of localized edema and hyperlipidemia  Mixed hyperlipidemia  Pt presents with hyperlipidemia. Compliance with treatment has been good; The patient maintains a low cholesterol diet , follows up as directed , and maintains an exercise regimen . The patient denies experiencing any hypercholesterolemia related symptoms.  Evidenced based information based on history , exam, and other sources has been used  for decision making.  Pt has history of edema - he takes furosemide 20mg  qd prn swelling - voices no problems or concerns today  Pt due to check PSA  Past Medical History:  Diagnosis Date  . Asthma   . Chest pain 04/15/2015  . Coronary artery calcification seen on CT scan 04/15/2015  . Coronary artery disease   . Essential hypertension 04/15/2015  . Hyperlipidemia 04/15/2015  . Morbid obesity (HCC) 04/15/2015  . TIA (transient ischemic attack)     Past Surgical History:  Procedure Laterality Date  . CARDIAC CATHETERIZATION N/A 04/22/2015   Procedure: Left Heart Cath and Coronary Angiography;  Surgeon: Peter M 04/24/2015, MD;  Location: [facility] INVASIVE CV LAB;  Service: Cardiovascular;  Laterality: N/A;  . GALLBLADDER SURGERY    . SINUS SURGERY WITH INSTATRAK      Family History  Problem Relation Age of Onset  . Hypertension Mother   . Rheum arthritis Mother   . Cancer Father   . Heart attack Father   . Cancer Brother   . Hypertension Sister     Social History   Socioeconomic History  . Marital status: Married    Spouse name: Not on file  . Number of children: Not on file  . Years of education: Not on file  . Highest education level: Not on file  Occupational History  . 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 Smoking status: Former Smoker     Quit date: 04/13/1989    Years since quitting: 30.1  . Smokeless tobacco: Never Used  Substance and Sexual Activity  . Alcohol use: Not on file  . Drug use: Not on file  . Sexual activity: Not on file  Other Topics Concern  . Not on file  Social History Narrative   Epworth Sleepiness Scale = 7 (as of 1/18/207)   Social Determinants of Health   Financial Resource Strain:   . Difficulty of Paying Living Expenses: Not on file  Food Insecurity:   . Worried About 04-17-1985 in the Last Year: Not on file  . Ran Out of Food in the Last Year: Not on file  Transportation Needs:   . Lack of Transportation (Medical): Not on file  . Lack of Transportation (Non-Medical): Not on file  Physical Activity:   . Days of Exercise per Week: Not on file  . Minutes of Exercise per Session: Not on file  Stress:   . Feeling of Stress : Not on file  Social Connections:   . Frequency of Communication with Friends and Family: Not on file  . Frequency of Social Gatherings with Friends and Family: Not on file  . Attends Religious Services: Not on file  . Active Member of Clubs or Organizations: Not on file  . Attends Programme researcher, broadcasting/film/video Meetings: Not on file  . Marital  Status: Not on file  Intimate Partner Violence:   . Fear of Current or Ex-Partner: Not on file  . Emotionally Abused: Not on file  . Physically Abused: Not on file  . Sexually Abused: Not on file     Current Outpatient Medications:  .  furosemide (LASIX) 20 MG tablet, Take 20 mg by mouth., Disp: , Rfl:  .  meloxicam (MOBIC) 7.5 MG tablet, Take 7.5 mg by mouth daily., Disp: , Rfl:  .  albuterol (VENTOLIN HFA) 108 (90 Base) MCG/ACT inhaler, Inhale 2 puffs into the lungs every 6 (six) hours as needed for wheezing or shortness of breath., Disp: 8 g, Rfl: 0 .  ascorbic acid (VITAMIN C) 500 MG tablet, Take 2 tablets (1,000 mg total) by mouth 2 (two) times daily., Disp: 20 tablet, Rfl: 0 .  zinc sulfate 220 (50 Zn) MG capsule,  Take 1 capsule (220 mg total) by mouth daily., Disp: 20 capsule, Rfl: 0   Allergies  Allergen Reactions  . Cephalexin Nausea And Vomiting and Rash    ROS CONSTITUTIONAL: Negative for chills, fatigue, fever, unintentional weight gain and unintentional weight loss.  E/N/T: Negative for ear pain, nasal congestion and sore throat.  CARDIOVASCULAR: Negative for chest pain, dizziness, palpitations and pedal edema.  RESPIRATORY: Negative for recent cough and dyspnea.  GASTROINTESTINAL: Negative for abdominal pain, acid reflux symptoms, constipation, diarrhea, nausea and vomiting.  MSK: Negative for arthralgias and myalgias.  INTEGUMENTARY: Negative for rash.  NEUROLOGICAL: Negative for dizziness and headaches.  PSYCHIATRIC: Negative for sleep disturbance and to question depression screen.  Negative for depression, negative for anhedonia.        Objective:    PHYSICAL EXAM:   VS: BP 120/80   Pulse 78   Temp (!) 96.2 F (35.7 C)   Resp 16   Wt 279 lb (126.6 kg)   SpO2 95%   BMI 47.89 kg/m   GEN: Well nourished, well developed, in no acute distress   Cardiac: RRR; no murmurs, rubs, or gallops,no edema - no significant varicosities Respiratory:  normal respiratory rate and pattern with no distress - normal breath sounds with no rales, rhonchi, wheezes or rubs  Skin: warm and dry, no rash  Neuro:  Alert and Oriented x 3, Strength and sensation are intact - CN II-Xii grossly intact Psych: euthymic mood, appropriate affect and demeanor  BP 120/80   Pulse 78   Temp (!) 96.2 F (35.7 C)   Resp 16   Wt 279 lb (126.6 kg)   SpO2 95%   BMI 47.89 kg/m  Wt Readings from Last 3 Encounters:  06/02/19 279 lb (126.6 kg)  04/06/19 266 lb (120.7 kg)  04/22/15 264 lb (119.7 kg)     Health Maintenance Due  Topic Date Due  . Hepatitis C Screening  07/[birth year]  . TETANUS/TDAP  12/04/1979  . COLONOSCOPY  12/04/2010  . INFLUENZA VACCINE  10/26/2018    There are no preventive care  reminders to display for this patient.  Lab Results  Component Value Date   TSH 1.062 04/14/2015   Lab Results  Component Value Date   WBC 5.8 04/07/2019   HGB 15.5 04/07/2019   HCT 48.1 04/07/2019   MCV 94.3 04/07/2019   PLT 115 (L) 04/07/2019   Lab Results  Component Value Date   NA 141 04/07/2019   K 4.3 04/07/2019   CO2 28 04/07/2019   GLUCOSE 134 (H) 04/07/2019   BUN 23 (H) 04/07/2019   CREATININE 1.04 04/07/2019  BILITOT 0.8 04/07/2019   ALKPHOS 49 04/07/2019   AST 19 04/07/2019   ALT 26 04/07/2019   PROT 6.5 04/07/2019   ALBUMIN 3.4 (L) 04/07/2019   CALCIUM 8.3 (L) 04/07/2019   ANIONGAP 11 04/07/2019   Lab Results  Component Value Date   CHOL 131 04/07/2019   Lab Results  Component Value Date   HDL 21 (L) 04/07/2019   Lab Results  Component Value Date   LDLCALC 94 04/07/2019   Lab Results  Component Value Date   TRIG 81 04/07/2019   Lab Results  Component Value Date   CHOLHDL 6.2 04/07/2019   Lab Results  Component Value Date   HGBA1C 5.9 (H) 04/07/2019      Assessment & Plan:   Problem List Items Addressed This Visit      Cardiovascular and Mediastinum   RESOLVED: Coronary artery calcification - Primary   Relevant Medications   furosemide (LASIX) 20 MG tablet   Other Relevant Orders   CBC with Differential/Platelet   Comprehensive metabolic panel   TSH   PSA     Other   Hyperlipidemia    labwork pending  Watch diet      Relevant Medications   furosemide (LASIX) 20 MG tablet   Other Relevant Orders   Lipid panel   Prostate cancer screening    PSA pending         No orders of the defined types were placed in this encounter.   Follow-up: Return in about 6 months (around 12/03/2019) for chronic follow up visit.    SARA R , PA-C

## 2019-06-02 NOTE — Assessment & Plan Note (Signed)
PSA pending

## 2019-06-02 NOTE — Assessment & Plan Note (Signed)
labwork pending Watch diet 

## 2019-06-04 LAB — CBC WITH DIFFERENTIAL/PLATELET
Basophils Absolute: 0.1 10*3/uL (ref 0.0–0.2)
Basos: 1 %
EOS (ABSOLUTE): 0.2 10*3/uL (ref 0.0–0.4)
Eos: 3 %
Hematocrit: 44.4 % (ref 37.5–51.0)
Hemoglobin: 15.5 g/dL (ref 13.0–17.7)
Immature Grans (Abs): 0 10*3/uL (ref 0.0–0.1)
Immature Granulocytes: 0 %
Lymphocytes Absolute: 2.8 10*3/uL (ref 0.7–3.1)
Lymphs: 39 %
MCH: 31.6 pg (ref 26.6–33.0)
MCHC: 34.9 g/dL (ref 31.5–35.7)
MCV: 91 fL (ref 79–97)
Monocytes Absolute: 0.6 10*3/uL (ref 0.1–0.9)
Monocytes: 8 %
Neutrophils Absolute: 3.5 10*3/uL (ref 1.4–7.0)
Neutrophils: 49 %
Platelets: 172 10*3/uL (ref 150–450)
RBC: 4.9 x10E6/uL (ref 4.14–5.80)
RDW: 13.6 % (ref 11.6–15.4)
WBC: 7.1 10*3/uL (ref 3.4–10.8)

## 2019-06-04 LAB — PSA: Prostate Specific Ag, Serum: 2.8 ng/mL (ref 0.0–4.0)

## 2019-06-04 LAB — CARDIOVASCULAR RISK ASSESSMENT

## 2019-06-04 LAB — COMPREHENSIVE METABOLIC PANEL
ALT: 28 IU/L (ref 0–44)
AST: 21 IU/L (ref 0–40)
Albumin/Globulin Ratio: 1.9 (ref 1.2–2.2)
Albumin: 4.1 g/dL (ref 3.8–4.9)
Alkaline Phosphatase: 89 IU/L (ref 39–117)
BUN/Creatinine Ratio: 10 (ref 9–20)
BUN: 10 mg/dL (ref 6–24)
Bilirubin Total: 0.9 mg/dL (ref 0.0–1.2)
CO2: 24 mmol/L (ref 20–29)
Calcium: 9 mg/dL (ref 8.7–10.2)
Chloride: 103 mmol/L (ref 96–106)
Creatinine, Ser: 1 mg/dL (ref 0.76–1.27)
GFR calc Af Amer: 95 mL/min/{1.73_m2} (ref >59)
GFR calc non Af Amer: 83 mL/min/{1.73_m2} (ref >59)
Globulin, Total: 2.2 g/dL (ref 1.5–4.5)
Glucose: 101 mg/dL — ABNORMAL HIGH (ref 65–99)
Potassium: 4.1 mmol/L (ref 3.5–5.2)
Sodium: 141 mmol/L (ref 134–144)
Total Protein: 6.3 g/dL (ref 6.0–8.5)

## 2019-06-04 LAB — LIPID PANEL
Chol/HDL Ratio: 5 ratio (ref 0.0–5.0)
Cholesterol, Total: 179 mg/dL (ref 100–199)
HDL: 36 mg/dL — ABNORMAL LOW (ref >39)
LDL Chol Calc (NIH): 113 mg/dL — ABNORMAL HIGH (ref 0–99)
Triglycerides: 172 mg/dL — ABNORMAL HIGH (ref 0–149)
VLDL Cholesterol Cal: 30 mg/dL (ref 5–40)

## 2019-06-04 LAB — TSH: TSH: 1.59 u[IU]/mL (ref 0.450–4.500)

## 2019-12-03 ENCOUNTER — Encounter: Payer: Self-pay | Admitting: Physician Assistant

## 2019-12-03 ENCOUNTER — Ambulatory Visit (INDEPENDENT_AMBULATORY_CARE_PROVIDER_SITE_OTHER): Payer: Commercial Managed Care - PPO | Admitting: Physician Assistant

## 2019-12-03 ENCOUNTER — Other Ambulatory Visit: Payer: Self-pay

## 2019-12-03 VITALS — BP 128/72 | HR 88 | Temp 97.3°F | Ht 64.0 in | Wt 283.0 lb

## 2019-12-03 DIAGNOSIS — I10 Essential (primary) hypertension: Secondary | ICD-10-CM | POA: Diagnosis not present

## 2019-12-03 DIAGNOSIS — E782 Mixed hyperlipidemia: Secondary | ICD-10-CM

## 2019-12-03 DIAGNOSIS — R739 Hyperglycemia, unspecified: Secondary | ICD-10-CM | POA: Diagnosis not present

## 2019-12-03 DIAGNOSIS — Z23 Encounter for immunization: Secondary | ICD-10-CM

## 2019-12-03 HISTORY — DX: Encounter for immunization: Z23

## 2019-12-03 MED ORDER — FUROSEMIDE 20 MG PO TABS: 20.0000 mg | ORAL_TABLET | Freq: Every day | ORAL | 1 refills | Status: DC

## 2019-12-03 MED ORDER — MELOXICAM 15 MG PO TABS: 15.0000 mg | ORAL_TABLET | Freq: Every day | ORAL | 1 refills | Status: DC

## 2019-12-03 NOTE — Progress Notes (Signed)
Established Patient Office Visit  Subjective:  Patient ID: Randall Rojas, male    DOB: 1961/03/08  Age: 59 y.o. MRN: [id number]  CC:  Chief Complaint  Patient presents with   6 months fasting    HPI CELSO GRANJA presents for follow up of localized edema and hyperlipidemia  Mixed hyperlipidemia  Pt presents with hyperlipidemia. Compliance with treatment has been good; The patient maintains a low cholesterol diet , follows up as directed , and maintains an exercise regimen . The patient denies experiencing any hypercholesterolemia related symptoms.  Evidenced based information based on history , exam, and other sources has been used  for decision making.  Pt has history of edema - he takes furosemide 20mg  qd prn swelling - voices no problems or concerns today  Pt has history of elevated glucose in past - currently not on medications - due for labwork  Pt would like to get flu shot today and pneumonia shot today  Past Medical History:  Diagnosis Date   Asthma    Chest pain 04/15/2015   Coronary artery calcification seen on CT scan 04/15/2015   Coronary artery disease    Essential hypertension 04/15/2015   Hyperlipidemia 04/15/2015   Morbid obesity (HCC) 04/15/2015   TIA (transient ischemic attack)     Past Surgical History:  Procedure Laterality Date   CARDIAC CATHETERIZATION N/A 04/22/2015   Procedure: Left Heart Cath and Coronary Angiography;  Surgeon: Peter M 04/24/2015, MD;  Location: [facility] INVASIVE CV LAB;  Service: Cardiovascular;  Laterality: N/A;   GALLBLADDER SURGERY     SINUS SURGERY WITH INSTATRAK      Family History  Problem Relation Age of Onset   Hypertension Mother    Rheum arthritis Mother    Cancer Father    Heart attack Father    Cancer Brother    Hypertension Sister     Social History   Socioeconomic History   Marital status: Married    Spouse name: Not on file   Number of children: Not on file   Years of education: Not on file    Highest education level: Not on file  Occupational History   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  Tobacco Use   Smoking status: Former Smoker    Quit date: 04/13/1989    Years since quitting: 30.6   Smokeless tobacco: Never Used  Substance and Sexual Activity   Alcohol use: Not on file   Drug use: Not on file   Sexual activity: Not on file  Other Topics Concern   Not on file  Social History Narrative   Epworth Sleepiness Scale = 7 (as of 1/18/207)   Social Determinants of Health   Financial Resource Strain:    Difficulty of Paying Living Expenses: Not on file  Food Insecurity:    Worried About Running Out of Food in the Last Year: Not on file   04-17-1985 of Food in the Last Year: Not on file  Transportation Needs:    Lack of Transportation (Medical): Not on file   Lack of Transportation (Non-Medical): Not on file  Physical Activity:    Days of Exercise per Week: Not on file   Minutes of Exercise per Session: Not on file  Stress:    Feeling of Stress : Not on file  Social Connections:    Frequency of Communication with Friends and Family: Not on file   Frequency of Social Gatherings with Friends and Family: Not on file   Attends Religious Services:  Not on file   Active Member of Clubs or Organizations: Not on file   Attends Club or Organization Meetings: Not on file   Marital Status: Not on file  Intimate Partner Violence:    Fear of Current or Ex-Partner: Not on file   Emotionally Abused: Not on file   Physically Abused: Not on file   Sexually Abused: Not on file     Current Outpatient Medications:    albuterol (VENTOLIN HFA) 108 (90 Base) MCG/ACT inhaler, Inhale 2 puffs into the lungs every 6 (six) hours as needed for wheezing or shortness of breath., Disp: 8 g, Rfl: 0   furosemide (LASIX) 20 MG tablet, Take 20 mg by mouth., Disp: , Rfl:    meloxicam (MOBIC) 15 MG tablet, Take 15 mg by mouth daily., Disp: , Rfl:    Allergies  Allergen  Reactions   Cephalexin Nausea And Vomiting and Rash    ROS CONSTITUTIONAL: Negative for chills, fatigue, fever, unintentional weight gain and unintentional weight loss.  E/N/T: Negative for ear pain, nasal congestion and sore throat.  CARDIOVASCULAR: Negative for chest pain, dizziness, palpitations and pedal edema.  RESPIRATORY: Negative for recent cough and dyspnea.  GASTROINTESTINAL: Negative for abdominal pain, acid reflux symptoms, constipation, diarrhea, nausea and vomiting.  MSK: Negative for arthralgias and myalgias.  INTEGUMENTARY: Negative for rash.  NEUROLOGICAL: Negative for dizziness and headaches.  PSYCHIATRIC: Negative for sleep disturbance and to question depression screen.  Negative for depression, negative for anhedonia.        Objective:    PHYSICAL EXAM:   VS: BP 128/72 (BP Location: Left Arm, Patient Position: Sitting)    Pulse 88    Temp (!) 97.3 F (36.3 C) (Temporal)    Ht 5\' 4"  (1.626 m)    Wt 283 lb (128.4 kg)    SpO2 94%    BMI 48.58 kg/m   GEN: Well nourished, well developed, in no acute distress   Cardiac: RRR; no murmurs, rubs, or gallops,no edema - no significant varicosities Respiratory:  normal respiratory rate and pattern with no distress - normal breath sounds with no rales, rhonchi, wheezes or rubs  Skin: warm and dry, no rash  Neuro:  Alert and Oriented x 3, Strength and sensation are intact - CN II-Xii grossly intact Psych: euthymic mood, appropriate affect and demeanor  BP 128/72 (BP Location: Left Arm, Patient Position: Sitting)    Pulse 88    Temp (!) 97.3 F (36.3 C) (Temporal)    Ht 5\' 4"  (1.626 m)    Wt 283 lb (128.4 kg)    SpO2 94%    BMI 48.58 kg/m  Wt Readings from Last 3 Encounters:  12/03/19 283 lb (128.4 kg)  06/02/19 279 lb (126.6 kg)  04/06/19 266 lb (120.7 kg)     Health Maintenance Due  Topic Date Due   Hepatitis C Screening  Never done   TETANUS/TDAP  Never done   COLONOSCOPY  Never done    There are no  preventive care reminders to display for this patient.  Lab Results  Component Value Date   TSH 1.590 06/02/2019   Lab Results  Component Value Date   WBC 7.1 06/02/2019   HGB 15.5 06/02/2019   HCT 44.4 06/02/2019   MCV 91 06/02/2019   PLT 172 06/02/2019   Lab Results  Component Value Date   NA 141 06/02/2019   K 4.1 06/02/2019   CO2 24 06/02/2019   GLUCOSE 101 (H) 06/02/2019   BUN 10  06/02/2019   CREATININE 1.00 06/02/2019   BILITOT 0.9 06/02/2019   ALKPHOS 89 06/02/2019   AST 21 06/02/2019   ALT 28 06/02/2019   PROT 6.3 06/02/2019   ALBUMIN 4.1 06/02/2019   CALCIUM 9.0 06/02/2019   ANIONGAP 11 04/07/2019   Lab Results  Component Value Date   CHOL 179 06/02/2019   Lab Results  Component Value Date   HDL 36 (L) 06/02/2019   Lab Results  Component Value Date   LDLCALC 113 (H) 06/02/2019   Lab Results  Component Value Date   TRIG 172 (H) 06/02/2019   Lab Results  Component Value Date   CHOLHDL 5.0 06/02/2019   Lab Results  Component Value Date   HGBA1C 5.9 (H) 04/07/2019      Assessment & Plan:   Problem List Items Addressed This Visit      Cardiovascular and Mediastinum   Essential hypertension    Continue current meds  labwork pending        Other   Mixed hyperlipidemia - Primary    Continue to watch diet labwork pending      Relevant Orders   CBC with Differential/Platelet   Comprehensive metabolic panel   Lipid panel   Hyperglycemia   Relevant Orders   Hemoglobin A1c   Need for prophylactic vaccination against Streptococcus pneumoniae (pneumococcus)    pneumo 23 given      Relevant Orders   Pneumococcal polysaccharide vaccine 23-valent greater than or equal to 2yo subcutaneous/IM   Need for prophylactic vaccination and inoculation against influenza    flucelvax given      Relevant Orders   Flu Vaccine MDCK QUAD PF (Completed)      No orders of the defined types were placed in this encounter.   Follow-up: Return in  about 6 months (around 06/01/2020) for chronic fasting follow up.    SARA R Victorian Gunn, PA-C

## 2019-12-03 NOTE — Assessment & Plan Note (Signed)
pneumo 23 given 

## 2019-12-03 NOTE — Assessment & Plan Note (Signed)
flucelvax given 

## 2019-12-03 NOTE — Assessment & Plan Note (Signed)
Continue current meds labwork pending 

## 2019-12-03 NOTE — Assessment & Plan Note (Signed)
Continue to watch diet - labwork pending °

## 2019-12-04 LAB — CBC WITH DIFFERENTIAL/PLATELET
Basophils Absolute: 0.1 10*3/uL (ref 0.0–0.2)
Basos: 1 %
EOS (ABSOLUTE): 0.3 10*3/uL (ref 0.0–0.4)
Eos: 3 %
Hematocrit: 48.7 % (ref 37.5–51.0)
Hemoglobin: 15.8 g/dL (ref 13.0–17.7)
Immature Grans (Abs): 0.1 10*3/uL (ref 0.0–0.1)
Immature Granulocytes: 1 %
Lymphocytes Absolute: 3.4 10*3/uL — ABNORMAL HIGH (ref 0.7–3.1)
Lymphs: 30 %
MCH: 30.4 pg (ref 26.6–33.0)
MCHC: 32.4 g/dL (ref 31.5–35.7)
MCV: 94 fL (ref 79–97)
Monocytes Absolute: 0.7 10*3/uL (ref 0.1–0.9)
Monocytes: 7 %
Neutrophils Absolute: 6.7 10*3/uL (ref 1.4–7.0)
Neutrophils: 58 %
Platelets: 179 10*3/uL (ref 150–450)
RBC: 5.19 x10E6/uL (ref 4.14–5.80)
RDW: 13.6 % (ref 11.6–15.4)
WBC: 11.3 10*3/uL — ABNORMAL HIGH (ref 3.4–10.8)

## 2019-12-04 LAB — COMPREHENSIVE METABOLIC PANEL
ALT: 24 IU/L (ref 0–44)
AST: 19 IU/L (ref 0–40)
Albumin/Globulin Ratio: 2 (ref 1.2–2.2)
Albumin: 4.5 g/dL (ref 3.8–4.9)
Alkaline Phosphatase: 93 IU/L (ref 48–121)
BUN/Creatinine Ratio: 11 (ref 9–20)
BUN: 13 mg/dL (ref 6–24)
Bilirubin Total: 0.9 mg/dL (ref 0.0–1.2)
CO2: 27 mmol/L (ref 20–29)
Calcium: 9.3 mg/dL (ref 8.7–10.2)
Chloride: 104 mmol/L (ref 96–106)
Creatinine, Ser: 1.17 mg/dL (ref 0.76–1.27)
GFR calc Af Amer: 79 mL/min/{1.73_m2} (ref >59)
GFR calc non Af Amer: 68 mL/min/{1.73_m2} (ref >59)
Globulin, Total: 2.3 g/dL (ref 1.5–4.5)
Glucose: 105 mg/dL — ABNORMAL HIGH (ref 65–99)
Potassium: 4 mmol/L (ref 3.5–5.2)
Sodium: 143 mmol/L (ref 134–144)
Total Protein: 6.8 g/dL (ref 6.0–8.5)

## 2019-12-04 LAB — LIPID PANEL
Chol/HDL Ratio: 4.7 ratio (ref 0.0–5.0)
Cholesterol, Total: 198 mg/dL (ref 100–199)
HDL: 42 mg/dL (ref >39)
LDL Chol Calc (NIH): 130 mg/dL — ABNORMAL HIGH (ref 0–99)
Triglycerides: 142 mg/dL (ref 0–149)
VLDL Cholesterol Cal: 26 mg/dL (ref 5–40)

## 2019-12-04 LAB — HEMOGLOBIN A1C
Est. average glucose Bld gHb Est-mCnc: 120 mg/dL
Hgb A1c MFr Bld: 5.8 % — ABNORMAL HIGH (ref 4.8–5.6)

## 2019-12-04 LAB — CARDIOVASCULAR RISK ASSESSMENT

## 2019-12-12 ENCOUNTER — Encounter: Payer: Self-pay | Admitting: Physician Assistant

## 2019-12-12 ENCOUNTER — Telehealth (INDEPENDENT_AMBULATORY_CARE_PROVIDER_SITE_OTHER): Payer: Commercial Managed Care - PPO | Admitting: Physician Assistant

## 2019-12-12 VITALS — Temp 98.0°F | Ht 64.0 in | Wt 280.0 lb

## 2019-12-12 DIAGNOSIS — J069 Acute upper respiratory infection, unspecified: Secondary | ICD-10-CM

## 2019-12-12 HISTORY — DX: Acute upper respiratory infection, unspecified: J06.9

## 2019-12-12 MED ORDER — AZITHROMYCIN 250 MG PO TABS: ORAL_TABLET | ORAL | 0 refills | Status: DC

## 2019-12-12 NOTE — Assessment & Plan Note (Signed)
Continue decongestants rx for zpack given COVID test pending - quarantine until results back

## 2019-12-12 NOTE — Progress Notes (Signed)
Virtual Visit via Telephone Note   This visit type was conducted due to national recommendations for restrictions regarding the COVID-19 Pandemic (e.g. social distancing) in an effort to limit this patient's exposure and mitigate transmission in our community.  Due to his co-morbid illnesses, this patient is at least at moderate risk for complications without adequate follow up.  This format is felt to be most appropriate for this patient at this time.  The patient did not have access to video technology/had technical difficulties with video requiring transitioning to audio format only (telephone).  All issues noted in this document were discussed and addressed.  No physical exam could be performed with this format.  Patient verbally consented to a telehealth visit.   Date:  12/12/2019   ID:  Randall Rojas, DOB October 03, 1960, MRN [id number]  Patient Location: Home Provider Location: Office  PCP:  Marianne Sofia, PA-C    Chief Complaint:  URI  History of Present Illness:    Randall Rojas is a 59 y.o. male with URI symptoms - pt states that after receiving flu and pneumo shot several days later complains of bad cough and chest congestion - he has not had a fever but has ad malaise, headache, and other family members in same household with same symptoms  The patient does have symptoms concerning for COVID-19 infection (fever, chills, cough, or new shortness of breath).    Past Medical History:  Diagnosis Date  . Asthma   . Chest pain 04/15/2015  . Coronary artery calcification seen on CT scan 04/15/2015  . Coronary artery disease   . Essential hypertension 04/15/2015  . Hyperlipidemia 04/15/2015  . Morbid obesity (HCC) 04/15/2015  . TIA (transient ischemic attack)    Past Surgical History:  Procedure Laterality Date  . CARDIAC CATHETERIZATION N/A 04/22/2015   Procedure: Left Heart Cath and Coronary Angiography;  Surgeon: Peter M Swaziland, MD;  Location: [facility] INVASIVE CV LAB;  Service:  Cardiovascular;  Laterality: N/A;  . GALLBLADDER SURGERY    . SINUS SURGERY WITH INSTATRAK       No outpatient medications have been marked as taking for the 12/12/19 encounter (Video Visit) with Marianne Sofia, PA-C.     Allergies:   Cephalexin   Social History   Tobacco Use  . Smoking status: Former Smoker    Quit date: 04/13/1989    Years since quitting: 30.6  . Smokeless tobacco: Never Used  Substance Use Topics  . Alcohol use: Not on file  . Drug use: Not on file     Family Hx: The patient's family history includes Cancer in his brother and father; Heart attack in his father; Hypertension in his mother and sister; Rheum arthritis in his mother.  ROS:   Please see the history of present illness.    All other systems reviewed and are negative.  Labs/Other Tests and Data Reviewed:    Recent Labs: 04/06/2019: B Natriuretic Peptide 14.8 06/02/2019: TSH 1.590 12/03/2019: ALT 24; BUN 13; Creatinine, Ser 1.17; Hemoglobin 15.8; Platelets 179; Potassium 4.0; Sodium 143   Recent Lipid Panel Lab Results  Component Value Date/Time   CHOL 198 12/03/2019 09:09 AM   TRIG 142 12/03/2019 09:09 AM   HDL 42 12/03/2019 09:09 AM   CHOLHDL 4.7 12/03/2019 09:09 AM   CHOLHDL 6.2 04/07/2019 03:34 AM   LDLCALC 130 (H) 12/03/2019 09:09 AM    Wt Readings from Last 3 Encounters:  12/12/19 280 lb (127 kg)  12/03/19 283 lb (128.4 kg)  06/02/19  279 lb (126.6 kg)     Objective:    Vital Signs:  Temp 98 F (36.7 C)   Ht 5\' 4"  (1.626 m)   Wt 280 lb (127 kg)   BMI 48.06 kg/m    VITAL SIGNS:  reviewed  ASSESSMENT & PLAN:    1. URI - rx for zpack as directed and to come by for COVID test - quarantine until resulted  COVID-19 Education: The signs and symptoms of COVID-19 were discussed with the patient and how to seek care for testing (follow up with PCP or arrange E-visit). The importance of social distancing was discussed today.  Time:   Today, I have spent 10 minutes with the patient  with telehealth technology discussing the above problems.     Medication Adjustments/Labs and Tests Ordered: Current medicines are reviewed at length with the patient today.  Concerns regarding medicines are outlined above.   Tests Ordered: Orders Placed This Encounter  Procedures  . Novel Coronavirus, NAA ([facility])    Medication Changes: Meds ordered this encounter  Medications  . azithromycin (ZITHROMAX) 250 MG tablet    Sig: 2 po day one then one po days 2-5    Dispense:  6 each    Refill:  0    Order Specific Question:   Supervising Provider    Answer    Follow Up:  In Person prn  Signed, Corey Harold, PA-C  12/12/2019 10:04 AM    [facility]

## 2019-12-13 LAB — NOVEL CORONAVIRUS, NAA: SARS-CoV-2, NAA: NOT DETECTED

## 2019-12-13 LAB — SARS-COV-2, NAA 2 DAY TAT

## 2019-12-16 ENCOUNTER — Other Ambulatory Visit: Payer: Self-pay | Admitting: Physician Assistant

## 2019-12-16 ENCOUNTER — Encounter: Payer: Self-pay | Admitting: Physician Assistant

## 2020-05-03 LAB — HM COLONOSCOPY

## 2020-05-04 ENCOUNTER — Ambulatory Visit: Payer: Commercial Managed Care - PPO | Admitting: Nurse Practitioner

## 2020-05-26 ENCOUNTER — Encounter: Payer: Self-pay | Admitting: Physician Assistant

## 2020-05-26 ENCOUNTER — Other Ambulatory Visit: Payer: Self-pay | Admitting: Physician Assistant

## 2020-05-26 ENCOUNTER — Other Ambulatory Visit: Payer: Self-pay

## 2020-05-26 ENCOUNTER — Ambulatory Visit (INDEPENDENT_AMBULATORY_CARE_PROVIDER_SITE_OTHER): Payer: Commercial Managed Care - PPO | Admitting: Physician Assistant

## 2020-05-26 VITALS — BP 130/78 | HR 86 | Temp 96.8°F | Ht 64.0 in | Wt 291.2 lb

## 2020-05-26 DIAGNOSIS — R29818 Other symptoms and signs involving the nervous system: Secondary | ICD-10-CM | POA: Diagnosis not present

## 2020-05-26 DIAGNOSIS — R0609 Other forms of dyspnea: Secondary | ICD-10-CM

## 2020-05-26 DIAGNOSIS — R29898 Other symptoms and signs involving the musculoskeletal system: Secondary | ICD-10-CM

## 2020-05-26 DIAGNOSIS — R0789 Other chest pain: Secondary | ICD-10-CM

## 2020-05-26 DIAGNOSIS — R0600 Dyspnea, unspecified: Secondary | ICD-10-CM

## 2020-05-26 DIAGNOSIS — R569 Unspecified convulsions: Secondary | ICD-10-CM | POA: Insufficient documentation

## 2020-05-26 HISTORY — DX: Other forms of dyspnea: R06.09

## 2020-05-26 HISTORY — DX: Dyspnea, unspecified: R06.00

## 2020-05-26 HISTORY — DX: Other symptoms and signs involving the musculoskeletal system: R29.898

## 2020-05-26 HISTORY — DX: Unspecified convulsions: R56.9

## 2020-05-26 LAB — BASIC METABOLIC PANEL
BUN: 14 (ref 4–21)
CO2: 30 — AB (ref 13–22)
Chloride: 105 (ref 99–108)
Creatinine: 0.9 (ref <=1.3)
Glucose: 125
Potassium: 3.9 (ref 3.4–5.3)
Sodium: 139 (ref 137–147)

## 2020-05-26 LAB — LIPID PANEL
Cholesterol: 173 (ref 0–200)
HDL: 30 — AB (ref 35–70)
LDL Cholesterol: 118
Triglycerides: 123 (ref 40–160)

## 2020-05-26 LAB — HEPATIC FUNCTION PANEL
ALT: 27 (ref 10–40)
AST: 22 (ref 14–40)
Bilirubin, Total: 1

## 2020-05-26 LAB — COMPREHENSIVE METABOLIC PANEL
Albumin: 3.9 (ref 3.5–5.0)
Calcium: 8.3 — AB (ref 8.7–10.7)

## 2020-05-26 MED ORDER — AZITHROMYCIN 250 MG PO TABS: ORAL_TABLET | ORAL | 0 refills | Status: DC

## 2020-05-26 NOTE — Progress Notes (Signed)
Subjective:  Patient ID: Randall Rojas, male    DOB: 1960-12-26  Age: 60 y.o. MRN: [id number]  Chief Complaint  Patient presents with  . left side weakness    HPI  pt states that 2-3 weeks ago he had episode at work where he had chest pain which he thought was reflux and vomited a few times - after that his left arm and left leg became weak - he has had changes in his vision (no loss but worsening), has been fatigued and began having exertional dyspnea He continues to have weakness on his left side His speech has not been affected Pt's wife said he just told her about the symptoms a few days ago and made appointment today after he had asked if life insurance was paid up because he has not been feeling well  I DID EXPRESS TO PATIENT AND WIFE IF HE WERE EVER TO HAVE ANY ACUTE SYMPTOMS LIKE THIS AGAIN TO GET IMMEDIATELY TO ED FOR EVALUATION Current Outpatient Medications on File Prior to Visit  Medication Sig Dispense Refill  . albuterol (VENTOLIN HFA) 108 (90 Base) MCG/ACT inhaler Inhale 2 puffs into the lungs every 6 (six) hours as needed for wheezing or shortness of breath. 8 g 0  . diclofenac (VOLTAREN) 75 MG EC tablet Take by mouth.    . furosemide (LASIX) 20 MG tablet Take 1 tablet (20 mg total) by mouth daily. 90 tablet 1   No current facility-administered medications on file prior to visit.   Past Medical History:  Diagnosis Date  . Asthma   . Chest pain 04/15/2015  . Coronary artery calcification seen on CT scan 04/15/2015  . Coronary artery disease   . Essential hypertension 04/15/2015  . Hyperlipidemia 04/15/2015  . Morbid obesity (HCC) 04/15/2015  . TIA (transient ischemic attack)    Past Surgical History:  Procedure Laterality Date  . CARDIAC CATHETERIZATION N/A 04/22/2015   Procedure: Left Heart Cath and Coronary Angiography;  Surgeon: Peter M Swaziland, MD;  Location: [facility] INVASIVE CV LAB;  Service: Cardiovascular;  Laterality: N/A;  . GALLBLADDER SURGERY    . SINUS SURGERY  WITH INSTATRAK      Family History  Problem Relation Age of Onset  . Hypertension Mother   . Rheum arthritis Mother   . Cancer Father   . Heart attack Father   . Cancer Brother   . Hypertension Sister    Social History   Socioeconomic History  . Marital status: Married    Spouse name: Not on file  . Number of children: Not on file  . Years of education: Not on file  . Highest education level: Not on file  Occupational History  . 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 Smoking status: Former Smoker    Quit date: 04/13/1989    Years since quitting: 31.1  . Smokeless tobacco: Never Used  Substance and Sexual Activity  . Alcohol use: Not on file  . Drug use: Not on file  . Sexual activity: Not on file  Other Topics Concern  . Not on file  Social History Narrative   Epworth Sleepiness Scale = 7 (as of 1/18/207)   Social Determinants of Health   Financial Resource Strain: Not on file  Food Insecurity: Not on file  Transportation Needs: Not on file  Physical Activity: Not on file  Stress: Not on file  Social Connections: Not on file    Review of Systems CONSTITUTIONAL:pt with moderate fatigue  Negative for  chills, fatigue, fever, unintentional weight gain and unintentional weight loss.  E/N/T: Negative for ear pain, nasal congestion and sore throat.  CARDIOVASCULAR:  No further chest pains - does have lower extremity edema that has worsened in the past few weeks RESPIRATORY: pt has noted new onset of exertional dyspnea GASTROINTESTINAL: Negative for abdominal pain, acid reflux symptoms, constipation, diarrhea, nausea and vomiting.  MSK: Negative for arthralgias and myalgias.  INTEGUMENTARY: Negative for rash.  NEUROLOGICAL: Negative for dizziness and headaches.  PSYCHIATRIC: Negative for sleep disturbance and to question depression screen.  Negative for depression, negative for anhedonia.       Objective:  BP 130/78 (BP Location: Right Arm, Patient Position:  Sitting, Cuff Size: Large)   Pulse 86   Temp (!) 96.8 F (36 C) (Temporal)   Ht 5\' 4"  (1.626 m)   Wt 291 lb 3.2 oz (132.1 kg)   SpO2 96%   BMI 49.98 kg/m   BP/Weight 05/26/2020 12/12/2019 12/03/2019  Systolic BP 130 - 128  Diastolic BP 78 - 72  Wt. (Lbs) 291.2 280 283  BMI 49.98 48.06 48.58    Physical Exam PHYSICAL EXAM:   VS: BP 130/78 (BP Location: Right Arm, Patient Position: Sitting, Cuff Size: Large)   Pulse 86   Temp (!) 96.8 F (36 C) (Temporal)   Ht 5\' 4"  (1.626 m)   Wt 291 lb 3.2 oz (132.1 kg)   SpO2 96%   BMI 49.98 kg/m   GEN: Well nourished, well developed, in no acute distress  HEENT: normal external ears and nose - normal external auditory canals and TMS  Oropharynx - normal mucosa, palate, and posterior pharynx Neck: no JVD or masses - no thyromegaly- no carotid bruits Cardiac: RRR; no murmurs, rubs, or gallops, 1+ pitting edema Respiratory:  normal respiratory rate and pattern with no distress - normal breath sounds with no rales, rhonchi, wheezes or rubs GI: normal bowel sounds, no masses or tenderness MS: noted weakness with left upper and left lower extremities - reflexes on left side decreased Skin: warm and dry, no rash  Neuro:  Alert and Oriented x 3, strength decreased on left side Psych: euthymic mood, appropriate affect and demeanor  EKG - no acute changes Diabetic Foot Exam - Simple   No data filed      Lab Results  Component Value Date   WBC 11.3 (H) 12/03/2019   HGB 15.8 12/03/2019   HCT 48.7 12/03/2019   PLT 179 12/03/2019   GLUCOSE 105 (H) 12/03/2019   CHOL 198 12/03/2019   TRIG 142 12/03/2019   HDL 42 12/03/2019   LDLCALC 130 (H) 12/03/2019   ALT 24 12/03/2019   AST 19 12/03/2019   NA 143 12/03/2019   K 4.0 12/03/2019   CL 104 12/03/2019   CREATININE 1.17 12/03/2019   BUN 13 12/03/2019   CO2 27 12/03/2019   TSH 1.590 06/02/2019   INR 0.92 04/14/2015   HGBA1C 5.8 (H) 12/03/2019      Assessment & Plan:   1.  Neurological deficit present (vision changes, decreased reflexes) - CT Head Wo Contrast  2. Other chest pain - CBC with Differential/Platelet - Comprehensive metabolic panel - TSH - Troponin T - Cardio IQ NT ProBNP - Lipid panel Recommend to start daily baby ASA 3. Exertional dyspnea - CBC with Differential/Platelet - Comprehensive metabolic panel - TSH - Troponin T - Cardio IQ NT ProBNP - Lipid panel   Will plan to order MRI brain, carotid dopplers, echocardiogram and referral to  cardiologist Awaiting CT and lab results No orders of the defined types were placed in this encounter.   Orders Placed This Encounter  Procedures  . CT Head Wo Contrast  . CBC with Differential/Platelet  . Comprehensive metabolic panel  . TSH  . Troponin T  . Cardio IQ NT ProBNP  . Lipid panel  . EKG 12-Lead     Follow-up: Return in about 3 weeks (around 06/16/2020) for follow up - block for 30 min.  An After Visit Summary was printed and given to the patient.  Jettie Pagan [facility] [phone]

## 2020-05-28 ENCOUNTER — Other Ambulatory Visit: Payer: Self-pay

## 2020-05-28 MED ORDER — ALBUTEROL SULFATE HFA 108 (90 BASE) MCG/ACT IN AERS: 2.0000 | INHALATION_SPRAY | Freq: Four times a day (QID) | RESPIRATORY_TRACT | 0 refills | Status: DC | PRN

## 2020-05-31 ENCOUNTER — Encounter: Payer: Self-pay | Admitting: Physician Assistant

## 2020-06-02 ENCOUNTER — Telehealth: Payer: Self-pay

## 2020-06-02 NOTE — Telephone Encounter (Signed)
Please reschedule this for patient

## 2020-06-02 NOTE — Telephone Encounter (Signed)
Patient went to have MRI done at [facility] and was not able to do it, He needs to go some where, were he can get an open MRI done.

## 2020-06-03 ENCOUNTER — Telehealth: Payer: Self-pay

## 2020-06-03 DIAGNOSIS — R29898 Other symptoms and signs involving the musculoskeletal system: Secondary | ICD-10-CM

## 2020-06-03 DIAGNOSIS — R29818 Other symptoms and signs involving the nervous system: Secondary | ICD-10-CM

## 2020-06-03 NOTE — Telephone Encounter (Signed)
MRI had to be reordered and sent to [facility] for scheduling with OPEN MRI per pt request.

## 2020-06-03 NOTE — Telephone Encounter (Signed)
Reordered MRI an sent it to [facility] to have a open MRI.

## 2020-06-07 ENCOUNTER — Telehealth: Payer: Self-pay

## 2020-06-07 NOTE — Telephone Encounter (Signed)
Ask patient if he feels he needs the medication --- I do not just generally give this medication for every MRI

## 2020-06-07 NOTE — Telephone Encounter (Signed)
Patient called last week and didn't get to do th MRI because he couldn't keep his nerves calm during the MRI.

## 2020-06-07 NOTE — Telephone Encounter (Signed)
Patient wife called and stated he has an MRI April 1 and the woman that is doing his MRI called and told him to call his PCP and see if he can get something to calm his nerves to take before his MRI.

## 2020-06-08 ENCOUNTER — Ambulatory Visit: Payer: Commercial Managed Care - PPO | Admitting: Physician Assistant

## 2020-06-08 ENCOUNTER — Other Ambulatory Visit: Payer: Self-pay | Admitting: Physician Assistant

## 2020-06-08 MED ORDER — LORAZEPAM 1 MG PO TABS: 1.0000 mg | ORAL_TABLET | Freq: Two times a day (BID) | ORAL | 0 refills | Status: DC | PRN

## 2020-06-08 NOTE — Telephone Encounter (Signed)
Called patient, spoke to his wife and let her know that medication was sent.

## 2020-06-08 NOTE — Telephone Encounter (Signed)
Will send in ativan

## 2020-06-17 ENCOUNTER — Ambulatory Visit (INDEPENDENT_AMBULATORY_CARE_PROVIDER_SITE_OTHER): Payer: Commercial Managed Care - PPO | Admitting: Physician Assistant

## 2020-06-17 ENCOUNTER — Other Ambulatory Visit: Payer: Self-pay

## 2020-06-17 ENCOUNTER — Encounter: Payer: Self-pay | Admitting: Physician Assistant

## 2020-06-17 VITALS — BP 130/84 | HR 87 | Temp 97.3°F | Ht 64.0 in | Wt 294.8 lb

## 2020-06-17 DIAGNOSIS — Z125 Encounter for screening for malignant neoplasm of prostate: Secondary | ICD-10-CM

## 2020-06-17 DIAGNOSIS — R5381 Other malaise: Secondary | ICD-10-CM

## 2020-06-17 DIAGNOSIS — E782 Mixed hyperlipidemia: Secondary | ICD-10-CM

## 2020-06-17 DIAGNOSIS — Z23 Encounter for immunization: Secondary | ICD-10-CM

## 2020-06-17 DIAGNOSIS — F419 Anxiety disorder, unspecified: Secondary | ICD-10-CM

## 2020-06-17 DIAGNOSIS — M2550 Pain in unspecified joint: Secondary | ICD-10-CM

## 2020-06-17 HISTORY — DX: Other malaise: R53.81

## 2020-06-17 HISTORY — DX: Encounter for immunization: Z23

## 2020-06-17 HISTORY — DX: Pain in unspecified joint: M25.50

## 2020-06-17 HISTORY — DX: Anxiety disorder, unspecified: F41.9

## 2020-06-17 NOTE — Progress Notes (Signed)
Established Patient Office Visit  Subjective:  Patient ID: Randall Rojas, male    DOB: 1960-12-26  Age: 60 y.o. MRN: [id number]  CC:  Chief Complaint  Patient presents with  . Hyperlipidemia    HPI Randall Rojas presents for follow up of localized edema and hyperlipidemia  Mixed hyperlipidemia  Pt presents with hyperlipidemia. Compliance with treatment has been good; The patient maintains a low cholesterol diet , follows up as directed . The patient denies experiencing any hypercholesterolemia related symptoms.  Currently he is not on medication  Pt has history of edema - he takes furosemide 20mg  qd prn swelling -states recently he has had more swelling than usual in feet and ankles  Pt was seen a few weeks ago with symptoms that presented as possible TIA - he had left sided weakness which now has mostly resolved - he did have CT which was normal and has been scheduled for MRI as well as cardiology consult, echo and carotid dopplers He is also being referred to cardiology for history of exertional dyspnea - he has appt with Dr next week  Pt with history of anxiety - stable on current medication of ativan  Pt states that he 'hurts all over' - says his bones and joints hurt all the time - he is taking voltaren which helps at times  Pt due to check PSA and is due to update tetanus booster  Past Medical History:  Diagnosis Date  . Asthma   . Chest pain 04/15/2015  . Coronary artery calcification seen on CT scan 04/15/2015  . Coronary artery disease   . Essential hypertension 04/15/2015  . Hyperlipidemia 04/15/2015  . Morbid obesity (HCC) 04/15/2015  . TIA (transient ischemic attack)     Past Surgical History:  Procedure Laterality Date  . CARDIAC CATHETERIZATION N/A 04/22/2015   Procedure: Left Heart Cath and Coronary Angiography;  Surgeon: Peter M 04/24/2015, MD;  Location: [facility] INVASIVE CV LAB;  Service: Cardiovascular;  Laterality: N/A;  . GALLBLADDER SURGERY    . SINUS  SURGERY WITH INSTATRAK      Family History  Problem Relation Age of Onset  . Hypertension Mother   . Rheum arthritis Mother   . Cancer Father   . Heart attack Father   . Cancer Brother   . Hypertension Sister     Social History   Socioeconomic History  . Marital status: Married    Spouse name: Not on file  . Number of children: Not on file  . Years of education: Not on file  . Highest education level: Not on file  Occupational History  . 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 Smoking status: Former Smoker    Quit date: 04/13/1989    Years since quitting: 31.2  . Smokeless tobacco: Never Used  Substance and Sexual Activity  . Alcohol use: Not on file  . Drug use: Not on file  . Sexual activity: Not on file  Other Topics Concern  . Not on file  Social History Narrative   Epworth Sleepiness Scale = 7 (as of 1/18/207)   Social Determinants of Health   Financial Resource Strain: Not on file  Food Insecurity: Not on file  Transportation Needs: Not on file  Physical Activity: Not on file  Stress: Not on file  Social Connections: Not on file  Intimate Partner Violence: Not on file     Current Outpatient Medications:  .  albuterol (VENTOLIN HFA) 108 (90 Base) MCG/ACT inhaler,  Inhale 2 puffs into the lungs every 6 (six) hours as needed for wheezing or shortness of breath., Disp: 8 g, Rfl: 0 .  diclofenac (VOLTAREN) 75 MG EC tablet, Take by mouth., Disp: , Rfl:  .  furosemide (LASIX) 20 MG tablet, Take 1 tablet (20 mg total) by mouth daily., Disp: 90 tablet, Rfl: 1 .  LORazepam (ATIVAN) 1 MG tablet, Take 1 tablet (1 mg total) by mouth 2 (two) times daily as needed for sedation., Disp: 2 tablet, Rfl: 0   Allergies  Allergen Reactions  . Cephalexin Nausea And Vomiting and Rash    ROS CONSTITUTIONAL: Negative for chills, fatigue, fever, unintentional weight gain and unintentional weight loss.  E/N/T: Negative for ear pain, nasal congestion and sore throat.   CARDIOVASCULAR: see HPI RESPIRATORY: Negative for recent cough and dyspnea.  GASTROINTESTINAL: Negative for abdominal pain, acid reflux symptoms, constipation, diarrhea, nausea and vomiting.  MSK: see HPI INTEGUMENTARY: Negative for rash.  NEUROLOGICAL: see HPI PSYCHIATRIC: Negative for sleep disturbance and to question depression screen.  Negative for depression, negative for anhedonia.            Objective:    PHYSICAL EXAM:   VS: BP 130/84 (BP Location: Left Arm, Patient Position: Sitting, Cuff Size: Large)   Pulse 87   Temp (!) 97.3 F (36.3 C) (Temporal)   Ht 5\' 4"  (1.626 m)   Wt 294 lb 12.8 oz (133.7 kg)   SpO2 93%   BMI 50.60 kg/m   PHYSICAL EXAM:   VS: BP 130/84 (BP Location: Left Arm, Patient Position: Sitting, Cuff Size: Large)   Pulse 87   Temp (!) 97.3 F (36.3 C) (Temporal)   Ht 5\' 4"  (1.626 m)   Wt 294 lb 12.8 oz (133.7 kg)   SpO2 93%   BMI 50.60 kg/m   GEN: Well nourished, well developed, in no acute distress  Cardiac: RRR; no murmurs, rubs, or gallops,minimal pedal edema noted - no significant varicosities Respiratory:  normal respiratory rate and pattern with no distress - normal breath sounds with no rales, rhonchi, wheezes or rubs MS: no deformity or atrophy  Skin: warm and dry, no rash  Neuro:  Alert and Oriented x 3, Strength and sensation are intact - CN II-Xii grossly intact Psych: euthymic mood, appropriate affect and demeanor   BP 130/84 (BP Location: Left Arm, Patient Position: Sitting, Cuff Size: Large)   Pulse 87   Temp (!) 97.3 F (36.3 C) (Temporal)   Ht 5\' 4"  (1.626 m)   Wt 294 lb 12.8 oz (133.7 kg)   SpO2 93%   BMI 50.60 kg/m  Wt Readings from Last 3 Encounters:  06/17/20 294 lb 12.8 oz (133.7 kg)  05/26/20 291 lb 3.2 oz (132.1 kg)  12/12/19 280 lb (127 kg)     There are no preventive care reminders to display for this patient.  There are no preventive care reminders to display for this patient.  Lab Results   Component Value Date   TSH 1.590 06/02/2019   Lab Results  Component Value Date   WBC 11.3 (H) 12/03/2019   HGB 15.8 12/03/2019   HCT 48.7 12/03/2019   MCV 94 12/03/2019   PLT 179 12/03/2019   Lab Results  Component Value Date   NA 139 05/26/2020   K 3.9 05/26/2020   CO2 30 (A) 05/26/2020   GLUCOSE 105 (H) 12/03/2019   BUN 14 05/26/2020   CREATININE 0.9 05/26/2020   BILITOT 0.9 12/03/2019   ALKPHOS 93 12/03/2019  AST 22 05/26/2020   ALT 27 05/26/2020   PROT 6.8 12/03/2019   ALBUMIN 3.9 05/26/2020   CALCIUM 8.3 (A) 05/26/2020   ANIONGAP 11 04/07/2019   Lab Results  Component Value Date   CHOL 173 05/26/2020   Lab Results  Component Value Date   HDL 30 (A) 05/26/2020   Lab Results  Component Value Date   LDLCALC 118 05/26/2020   Lab Results  Component Value Date   TRIG 123 05/26/2020   Lab Results  Component Value Date   CHOLHDL 4.7 12/03/2019   Lab Results  Component Value Date   HGBA1C 5.8 (H) 12/03/2019      Assessment & Plan:   Problem List Items Addressed This Visit      Other   Mixed hyperlipidemia   Relevant Orders   Lipid panel   Prostate cancer screening   Relevant Orders   PSA   Arthralgia - Primary   Relevant Orders   CYCLIC CITRUL PEPTIDE ANTIBODY, IGG/IGA   Sedimentation rate   C-reactive protein   Rheumatoid factor   ANA w/Reflex   Uric acid   Parvovirus B19 antibody, IgG and IgM   Malaise   Relevant Orders   CBC with Differential/Platelet   Comprehensive metabolic panel   TSH   Need for tetanus booster   Relevant Orders   Tdap vaccine greater than or equal to 7yo IM (Completed)   Anxiety Continue current meds as directed      No orders of the defined types were placed in this encounter.   Follow-up: Return in about 3 months (around 09/17/2020) for chronic fasting follow up.    SARA R Myeesha Shane, PA-C

## 2020-06-18 LAB — CBC WITH DIFFERENTIAL/PLATELET
Basophils Absolute: 0.1 10*3/uL (ref 0.0–0.2)
Basos: 1 %
EOS (ABSOLUTE): 0.3 10*3/uL (ref 0.0–0.4)
Eos: 4 %
Hematocrit: 44.5 % (ref 37.5–51.0)
Hemoglobin: 14.9 g/dL (ref 13.0–17.7)
Immature Grans (Abs): 0.1 10*3/uL (ref 0.0–0.1)
Immature Granulocytes: 1 %
Lymphocytes Absolute: 2.5 10*3/uL (ref 0.7–3.1)
Lymphs: 32 %
MCH: 30.5 pg (ref 26.6–33.0)
MCHC: 33.5 g/dL (ref 31.5–35.7)
MCV: 91 fL (ref 79–97)
Monocytes Absolute: 0.5 10*3/uL (ref 0.1–0.9)
Monocytes: 7 %
Neutrophils Absolute: 4.3 10*3/uL (ref 1.4–7.0)
Neutrophils: 55 %
Platelets: 170 10*3/uL (ref 150–450)
RBC: 4.89 x10E6/uL (ref 4.14–5.80)
RDW: 12.8 % (ref 11.6–15.4)
WBC: 7.7 10*3/uL (ref 3.4–10.8)

## 2020-06-18 LAB — LIPID PANEL
Chol/HDL Ratio: 5 ratio (ref 0.0–5.0)
Cholesterol, Total: 190 mg/dL (ref 100–199)
HDL: 38 mg/dL — ABNORMAL LOW (ref >39)
LDL Chol Calc (NIH): 120 mg/dL — ABNORMAL HIGH (ref 0–99)
Triglycerides: 178 mg/dL — ABNORMAL HIGH (ref 0–149)
VLDL Cholesterol Cal: 32 mg/dL (ref 5–40)

## 2020-06-18 LAB — COMPREHENSIVE METABOLIC PANEL
ALT: 22 IU/L (ref 0–44)
AST: 20 IU/L (ref 0–40)
Albumin/Globulin Ratio: 1.4 (ref 1.2–2.2)
Albumin: 3.9 g/dL (ref 3.8–4.9)
Alkaline Phosphatase: 105 IU/L (ref 44–121)
BUN/Creatinine Ratio: 17 (ref 9–20)
BUN: 17 mg/dL (ref 6–24)
Bilirubin Total: 0.4 mg/dL (ref 0.0–1.2)
CO2: 24 mmol/L (ref 20–29)
Calcium: 9 mg/dL (ref 8.7–10.2)
Chloride: 102 mmol/L (ref 96–106)
Creatinine, Ser: 1.01 mg/dL (ref 0.76–1.27)
Globulin, Total: 2.8 g/dL (ref 1.5–4.5)
Glucose: 101 mg/dL — ABNORMAL HIGH (ref 65–99)
Potassium: 5.2 mmol/L (ref 3.5–5.2)
Sodium: 139 mmol/L (ref 134–144)
Total Protein: 6.7 g/dL (ref 6.0–8.5)
eGFR: 86 mL/min/{1.73_m2} (ref >59)

## 2020-06-18 LAB — CARDIOVASCULAR RISK ASSESSMENT

## 2020-06-18 LAB — PSA: Prostate Specific Ag, Serum: 3.6 ng/mL (ref 0.0–4.0)

## 2020-06-18 LAB — ANA W/REFLEX: Anti Nuclear Antibody (ANA): NEGATIVE

## 2020-06-18 LAB — SEDIMENTATION RATE: Sed Rate: 27 mm/hr (ref 0–30)

## 2020-06-18 LAB — C-REACTIVE PROTEIN: CRP: 10 mg/L (ref 0–10)

## 2020-06-18 LAB — URIC ACID: Uric Acid: 4.4 mg/dL (ref 3.8–8.4)

## 2020-06-18 LAB — PARVOVIRUS B19 ANTIBODY, IGG AND IGM
Parvovirus B19 IgG: 3.6 index — ABNORMAL HIGH (ref 0.0–0.8)
Parvovirus B19 IgM: 0.1 index (ref 0.0–0.8)

## 2020-06-18 LAB — CYCLIC CITRUL PEPTIDE ANTIBODY, IGG/IGA: Cyclic Citrullin Peptide Ab: 8 units (ref 0–19)

## 2020-06-18 LAB — RHEUMATOID FACTOR: Rheumatoid fact SerPl-aCnc: <10 IU/mL (ref <14.0)

## 2020-06-18 LAB — TSH: TSH: 0.97 u[IU]/mL (ref 0.450–4.500)

## 2020-06-22 DIAGNOSIS — I2510 Atherosclerotic heart disease of native coronary artery without angina pectoris: Secondary | ICD-10-CM | POA: Insufficient documentation

## 2020-06-23 ENCOUNTER — Other Ambulatory Visit: Payer: Self-pay

## 2020-06-23 ENCOUNTER — Encounter: Payer: Self-pay | Admitting: Cardiology

## 2020-06-23 ENCOUNTER — Ambulatory Visit (INDEPENDENT_AMBULATORY_CARE_PROVIDER_SITE_OTHER): Payer: Commercial Managed Care - PPO | Admitting: Cardiology

## 2020-06-23 VITALS — BP 132/84 | HR 85 | Ht 64.0 in | Wt 297.2 lb

## 2020-06-23 DIAGNOSIS — R0602 Shortness of breath: Secondary | ICD-10-CM

## 2020-06-23 DIAGNOSIS — R600 Localized edema: Secondary | ICD-10-CM

## 2020-06-23 DIAGNOSIS — I25118 Atherosclerotic heart disease of native coronary artery with other forms of angina pectoris: Secondary | ICD-10-CM

## 2020-06-23 DIAGNOSIS — R7303 Prediabetes: Secondary | ICD-10-CM

## 2020-06-23 DIAGNOSIS — R079 Chest pain, unspecified: Secondary | ICD-10-CM | POA: Diagnosis not present

## 2020-06-23 HISTORY — DX: Localized edema: R60.0

## 2020-06-23 HISTORY — DX: Atherosclerotic heart disease of native coronary artery with other forms of angina pectoris: I25.118

## 2020-06-23 HISTORY — DX: Shortness of breath: R06.02

## 2020-06-23 HISTORY — DX: Prediabetes: R73.03

## 2020-06-23 MED ORDER — FUROSEMIDE 40 MG PO TABS
ORAL_TABLET | ORAL | 3 refills | Status: DC
Start: 2020-06-23 — End: 2020-07-02

## 2020-06-23 MED ORDER — POTASSIUM CHLORIDE CRYS ER 20 MEQ PO TBCR: EXTENDED_RELEASE_TABLET | ORAL | 3 refills | Status: DC

## 2020-06-23 NOTE — Progress Notes (Signed)
Cardiology Office Note:    Date:  06/23/2020   ID:  Randall Rojas, DOB 1960-10-28, MRN [id number]  PCP:  Marianne Sofia, PA-C  Cardiologist:  No primary care provider on file.  Electrophysiologist:  None   Referring MD: Marianne Sofia, PA-C   I have been really really short of breath  History of Present Illness:    Randall Rojas is a 60 y.o. male with a hx of hyperlipidemia, nonobstructive coronary artery disease, heart catheterization which was done in January 2017, history of TIA, hypertension, obesity, present at the request of his primary care doctor to be evaluated for shortness of breath. The patient is in the office with his wife today. The patient tells me the last several months he has been experiencing worsening shortness of breath and exertion and intermittent chest pain.  He described the chest pain as a pressure-like sensation which is midsternal comes and goes.  Nothing makes it better or worse.  Was most bothersome to the fact that he had significant shortness of breath on exertion.  He notes that the bilateral leg edema is worsening.  He recently has been started on Lasix by his PCP but he does not feel any significant improvement.  Past Medical History:  Diagnosis Date  . Asthma   . Chest pain 04/15/2015  . Coronary artery calcification seen on CT scan 04/15/2015  . Coronary artery disease   . Essential hypertension 04/15/2015  . Hyperlipidemia 04/15/2015  . Morbid obesity (HCC) 04/15/2015  . TIA (transient ischemic attack)     Past Surgical History:  Procedure Laterality Date  . CARDIAC CATHETERIZATION N/A 04/22/2015   Procedure: Left Heart Cath and Coronary Angiography;  Surgeon: Peter M Swaziland, MD;  Location: [facility] INVASIVE CV LAB;  Service: Cardiovascular;  Laterality: N/A;  . GALLBLADDER SURGERY    . SINUS SURGERY WITH INSTATRAK      Current Medications: Current Meds  Medication Sig  . albuterol (VENTOLIN HFA) 108 (90 Base) MCG/ACT inhaler Inhale 2 puffs into the  lungs every 6 (six) hours as needed for wheezing or shortness of breath.  . ASPIRIN 81 PO Take 1 tablet by mouth daily.  . diclofenac (VOLTAREN) 75 MG EC tablet Take 75 mg by mouth 2 (two) times daily.  . furosemide (LASIX) 40 MG tablet Take 40 mg twice daily for 5 days then 40 mg once daily  . LORazepam (ATIVAN) 1 MG tablet Take 1 tablet (1 mg total) by mouth 2 (two) times daily as needed for sedation.  . potassium chloride SA (KLOR-CON) 20 MEQ tablet Take 20 meq twice daily for 5 days then 20 meq once daily  . [DISCONTINUED] furosemide (LASIX) 20 MG tablet Take 1 tablet (20 mg total) by mouth daily.     Allergies:   Cephalexin   Social History   Socioeconomic History  . Marital status: Married    Spouse name: Not on file  . Number of children: Not on file  . Years of education: Not on file  . Highest education level: Not on file  Occupational History  . 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 Smoking status: Former Smoker    Quit date: 04/13/1989    Years since quitting: 31.2  . Smokeless tobacco: Never Used  Substance and Sexual Activity  . Alcohol use: Not on file  . Drug use: Not on file  . Sexual activity: Not on file  Other Topics Concern  . Not on file  Social History Narrative  Epworth Sleepiness Scale = 7 (as of 1/18/207)   Social Determinants of Health   Financial Resource Strain: Not on file  Food Insecurity: Not on file  Transportation Needs: Not on file  Physical Activity: Not on file  Stress: Not on file  Social Connections: Not on file     Family History: The patient's family history includes Cancer in his brother and father; Heart attack in his father; Hypertension in his mother and sister; Rheum arthritis in his mother.  ROS:   Review of Systems  Constitution: Negative for decreased appetite, fever and weight gain.  HENT: Negative for congestion, ear discharge, hoarse voice and sore throat.   Eyes: Negative for discharge, redness, vision loss  in right eye and visual halos.  Cardiovascular: Reports chest pain, dyspnea on exertion, leg swelling.  Negative for orthopnea and palpitations.  Respiratory: Negative for cough, hemoptysis, shortness of breath and snoring.   Endocrine: Negative for heat intolerance and polyphagia.  Hematologic/Lymphatic: Negative for bleeding problem. Does not bruise/bleed easily.  Skin: Negative for flushing, nail changes, rash and suspicious lesions.  Musculoskeletal: Negative for arthritis, joint pain, muscle cramps, myalgias, neck pain and stiffness.  Gastrointestinal: Negative for abdominal pain, bowel incontinence, diarrhea and excessive appetite.  Genitourinary: Negative for decreased libido, genital sores and incomplete emptying.  Neurological: Negative for brief paralysis, focal weakness, headaches and loss of balance.  Psychiatric/Behavioral: Negative for altered mental status, depression and suicidal ideas.  Allergic/Immunologic: Negative for HIV exposure and persistent infections.    EKGs/Labs/Other Studies Reviewed:    The following studies were reviewed today:   EKG:  The ekg ordered today demonstrates sinus rhythm, heart rate 85 bpm with left axis deviation.   Left heart catheterization which was done in 2017 Dist RCA lesion, 30% stenosed.  Prox LAD to Mid LAD lesion, 10% stenosed.  The left ventricular systolic function is normal.   1. Mild nonobstructive CAD 2. Normal LV function  Recommendation: risk factor modification.   Recent Labs: 06/17/2020: ALT 22; BUN 17; Creatinine, Ser 1.01; Hemoglobin 14.9; Platelets 170; Potassium 5.2; Sodium 139; TSH 0.970  Recent Lipid Panel    Component Value Date/Time   CHOL 190 06/17/2020 1034   TRIG 178 (H) 06/17/2020 1034   HDL 38 (L) 06/17/2020 1034   CHOLHDL 5.0 06/17/2020 1034   CHOLHDL 6.2 04/07/2019 0334   VLDL 16 04/07/2019 0334   LDLCALC 120 (H) 06/17/2020 1034    Physical Exam:    VS:  BP 132/84 (BP Location: Right Arm)    Pulse 85   Ht 5\' 4"  (1.626 m)   Wt 297 lb 3.2 oz (134.8 kg)   SpO2 97%   BMI 51.01 kg/m     Wt Readings from Last 3 Encounters:  06/23/20 297 lb 3.2 oz (134.8 kg)  06/17/20 294 lb 12.8 oz (133.7 kg)  05/26/20 291 lb 3.2 oz (132.1 kg)     GEN: Well nourished, well developed in no acute distress HEENT: Normal NECK: No JVD; No carotid bruits LYMPHATICS: No lymphadenopathy CARDIAC: S1S2 noted,RRR, no murmurs, rubs, gallops RESPIRATORY:  Clear to auscultation without rales, wheezing or rhonchi  ABDOMEN: Soft, non-tender, non-distended, +bowel sounds, no guarding. EXTREMITIES: +3 bilateral lower extremity edema, No cyanosis, no clubbing MUSCULOSKELETAL:  No deformity  SKIN: Warm and dry NEUROLOGIC:  Alert and oriented x 3, non-focal PSYCHIATRIC:  Normal affect, good insight  ASSESSMENT:    1. Shortness of breath   2. Chest pain, unspecified type   3. Bilateral leg edema  4. Prediabetes   5. Coronary artery disease of native artery of native heart with stable angina pectoris (HCC)    PLAN:     He does have significant bilateral +3 leg edema.  Like to do is aggressively diurese the patient in outpatient setting starting with 40 mg Lasix twice daily for the next 5 days.  He will continue 40 mg daily potassium supplement.  An echocardiogram will be done to assess his LV and RV function.  In addition he has coronary artery disease his chest pain is concerning we will get a Lexiscan to understand if there is any progression in his heart disease.  In the meantime nitroglycerin will be ordered and the patient has been educated on how to use this medication pain  His LDL is 120 talk to the patient about this.  Today he prefers to move forward with diet modification and has declined any medication use.  His blood pressure acceptable we will continue to monitor this on increased dose of Lasix.  The patient understands the need to lose weight with diet and exercise. We have discussed  specific strategies for this.  The patient is in agreement with the above plan. The patient left the office in stable condition.  The patient will follow up in   Medication Adjustments/Labs and Tests Ordered: Current medicines are reviewed at length with the patient today.  Concerns regarding medicines are outlined above.  Orders Placed This Encounter  Procedures  . MYOCARDIAL PERFUSION IMAGING  . EKG 12-Lead  . ECHOCARDIOGRAM COMPLETE   Meds ordered this encounter  Medications  . furosemide (LASIX) 40 MG tablet    Sig: Take 40 mg twice daily for 5 days then 40 mg once daily    Dispense:  90 tablet    Refill:  3  . potassium chloride SA (KLOR-CON) 20 MEQ tablet    Sig: Take 20 meq twice daily for 5 days then 20 meq once daily    Dispense:  90 tablet    Refill:  3    Patient Instructions   Medication Instructions:  Your physician has recommended you make the following change in your medication: INCREASE: Lasix 40 mg twice a day for 5 days than 40 mg once daily.  INCREASE:  Potassium 20 meq twice a day for 5 days than 20 meq once daily  *If you need a refill on your cardiac medications before your next appointment, please call your pharmacy*   Lab Work: None If you have labs (blood work) drawn today and your tests are completely normal, you will receive your results only by: Marland Kitchen MyChart Message (if you have MyChart) OR . A paper copy in the mail If you have any lab test that is abnormal or we need to change your treatment, we will call you to review the results.   Testing/Procedures: Your physician has requested that you have an echocardiogram. Echocardiography is a painless test that uses sound waves to create images of your heart. It provides your doctor with information about the size and shape of your heart and how well your heart's chambers and valves are working. This procedure takes approximately one hour. There are no restrictions for this procedure.  Your physician  has requested that you have a lexiscan myoview. For further information please visit [email or web address] Please follow instruction sheet, as given.  Follow-Up: At [facility], you and your health needs are our priority.  As part of our continuing mission to provide you with exceptional heart care,  we have created designated Provider Care Teams.  These Care Teams include your primary Cardiologist (physician) and Advanced Practice Providers (APPs -  Physician Assistants and Nurse Practitioners) who all work together to provide you with the care you need, when you need it.  We recommend signing up for the patient portal called "MyChart".  Sign up information is provided on this After Visit Summary.  MyChart is used to connect with patients for Virtual Visits (Telemedicine).  Patients are able to view lab/test results, encounter notes, upcoming appointments, etc.  Non-urgent messages can be sent to your provider as well.   To learn more about what you can do with MyChart, go to [email or web address].    Your next appointment:   2 week(s)  The format for your next appointment:   In Person  Provider:   Thomasene Ripple, DO   Other Instructions  Cardiac Nuclear Scan A cardiac nuclear scan is a test that is done to check the flow of blood to your heart. It is done when you are resting and when you are exercising. The test looks for problems such as:  Not enough blood reaching a portion of the heart.  The heart muscle not working as it should. You may need this test if:  You have heart disease.  You have had lab results that are not normal.  You have had heart surgery or a balloon procedure to open up blocked arteries (angioplasty).  You have chest pain.  You have shortness of breath. In this test, a special dye (tracer) is put into your bloodstream. The tracer will travel to your heart. A camera will then take pictures of your heart to see how the tracer moves through your heart. This  test is usually done at a hospital and takes 2-4 hours. Tell a doctor about:  Any allergies you have.  All medicines you are taking, including vitamins, herbs, eye drops, creams, and over-the-counter medicines.  Any problems you or family members have had with anesthetic medicines.  Any blood disorders you have.  Any surgeries you have had.  Any medical conditions you have.  Whether you are pregnant or may be pregnant. What are the risks? Generally, this is a safe test. However, problems may occur, such as:  Serious chest pain and heart attack. This is only a risk if the stress portion of the test is done.  Rapid heartbeat.  A feeling of warmth in your chest. This feeling usually does not last long.  Allergic reaction to the tracer. What happens before the test?  Ask your doctor about changing or stopping your normal medicines. This is important.  Follow instructions from your doctor about what you cannot eat or drink.  Remove your jewelry on the day of the test. What happens during the test?  An IV tube will be inserted into one of your veins.  Your doctor will give you a small amount of tracer through the IV tube.  You will wait for 20-40 minutes while the tracer moves through your bloodstream.  Your heart will be monitored with an electrocardiogram (ECG).  You will lie down on an exam table.  Pictures of your heart will be taken for about 15-20 minutes.  You may also have a stress test. For this test, one of these things may be done: ? You will be asked to exercise on a treadmill or a stationary bike. ? You will be given medicines that will make your heart work harder. This is done if  you are unable to exercise.  When blood flow to your heart has peaked, a tracer will again be given through the IV tube.  After 20-40 minutes, you will get back on the exam table. More pictures will be taken of your heart.  Depending on the tracer that is used, more pictures may  need to be taken 3-4 hours later.  Your IV tube will be removed when the test is over. The test may vary among doctors and hospitals. What happens after the test?  Ask your doctor: ? Whether you can return to your normal schedule, including diet, activities, and medicines. ? Whether you should drink more fluids. This will help to remove the tracer from your body. Drink enough fluid to keep your pee (urine) pale yellow.  Ask your doctor, or the department that is doing the test: ? When will my results be ready? ? How will I get my results? Summary  A cardiac nuclear scan is a test that is done to check the flow of blood to your heart.  Tell your doctor whether you are pregnant or may be pregnant.  Before the test, ask your doctor about changing or stopping your normal medicines. This is important.  Ask your doctor whether you can return to your normal activities. You may be asked to drink more fluids. This information is not intended to replace advice given to you by your health care provider. Make sure you discuss any questions you have with your health care provider. Document Revised: 07/03/2018 Document Reviewed: 08/27/2017 Elsevier Patient Education  2021 Elsevier Inc.  Echocardiogram An echocardiogram is a test that uses sound waves (ultrasound) to produce images of the heart. Images from an echocardiogram can provide important information about:  Heart size and shape.  The size and thickness and movement of your heart's walls.  Heart muscle function and strength.  Heart valve function or if you have stenosis. Stenosis is when the heart valves are too narrow.  If blood is flowing backward through the heart valves (regurgitation).  A tumor or infectious growth around the heart valves.  Areas of heart muscle that are not working well because of poor blood flow or injury from a heart attack.  Aneurysm detection. An aneurysm is a weak or damaged part of an artery wall.  The wall bulges out from the normal force of blood pumping through the body. Tell a health care provider about:  Any allergies you have.  All medicines you are taking, including vitamins, herbs, eye drops, creams, and over-the-counter medicines.  Any blood disorders you have.  Any surgeries you have had.  Any medical conditions you have.  Whether you are pregnant or may be pregnant. What are the risks? Generally, this is a safe test. However, problems may occur, including an allergic reaction to dye (contrast) that may be used during the test. What happens before the test? No specific preparation is needed. You may eat and drink normally. What happens during the test?  You will take off your clothes from the waist up and put on a hospital gown.  Electrodes or electrocardiogram (ECG)patches may be placed on your chest. The electrodes or patches are then connected to a device that monitors your heart rate and rhythm.  You will lie down on a table for an ultrasound exam. A gel will be applied to your chest to help sound waves pass through your skin.  A handheld device, called a transducer, will be pressed against your chest and moved  over your heart. The transducer produces sound waves that travel to your heart and bounce back (or "echo" back) to the transducer. These sound waves will be captured in real-time and changed into images of your heart that can be viewed on a video monitor. The images will be recorded on a computer and reviewed by your health care provider.  You may be asked to change positions or hold your breath for a short time. This makes it easier to get different views or better views of your heart.  In some cases, you may receive contrast through an IV in one of your veins. This can improve the quality of the pictures from your heart. The procedure may vary among health care providers and hospitals.   What can I expect after the test? You may return to your normal,  everyday life, including diet, activities, and medicines, unless your health care provider tells you not to do that. Follow these instructions at home:  It is up to you to get the results of your test. Ask your health care provider, or the department that is doing the test, when your results will be ready.  Keep all follow-up visits. This is important. Summary  An echocardiogram is a test that uses sound waves (ultrasound) to produce images of the heart.  Images from an echocardiogram can provide important information about the size and shape of your heart, heart muscle function, heart valve function, and other possible heart problems.  You do not need to do anything to prepare before this test. You may eat and drink normally.  After the echocardiogram is completed, you may return to your normal, everyday life, unless your health care provider tells you not to do that. This information is not intended to replace advice given to you by your health care provider. Make sure you discuss any questions you have with your health care provider. Document Revised: 11/04/2019 Document Reviewed: 11/04/2019 Elsevier Patient Education  2021 Elsevier Inc.   Mediterranean Diet A Mediterranean diet refers to food and lifestyle choices that are based on the traditions of countries located on the [place]. This way of eating has been shown to help prevent certain conditions and improve outcomes for people who have chronic diseases, like kidney disease and heart disease. What are tips for following this plan? Lifestyle  Cook and eat meals together with your family, when possible.  Drink enough fluid to keep your urine clear or pale yellow.  Be physically active every day. This includes: ? Aerobic exercise like running or swimming. ? Leisure activities like gardening, walking, or housework.  Get 7-8 hours of sleep each night.  If recommended by your health care provider, drink red wine in  moderation. This means 1 glass a day for nonpregnant women and 2 glasses a day for men. A glass of wine equals 5 oz (150 mL). Reading food labels  Check the serving size of packaged foods. For foods such as rice and pasta, the serving size refers to the amount of cooked product, not dry.  Check the total fat in packaged foods. Avoid foods that have saturated fat or trans fats.  Check the ingredients list for added sugars, such as corn syrup.   Shopping  At the grocery store, buy most of your food from the areas near the walls of the store. This includes: ? Fresh fruits and vegetables (produce). ? Grains, beans, nuts, and seeds. Some of these may be available in unpackaged forms or large amounts (in  bulk). ? Fresh seafood. ? Poultry and eggs. ? Low-fat dairy products.  Buy whole ingredients instead of prepackaged foods.  Buy fresh fruits and vegetables in-season from local farmers markets.  Buy frozen fruits and vegetables in resealable bags.  If you do not have access to quality fresh seafood, buy precooked frozen shrimp or canned fish, such as tuna, salmon, or sardines.  Buy small amounts of raw or cooked vegetables, salads, or olives from the deli or salad bar at your store.  Stock your pantry so you always have certain foods on hand, such as olive oil, canned tuna, canned tomatoes, rice, pasta, and beans. Cooking  Cook foods with extra-virgin olive oil instead of using butter or other vegetable oils.  Have meat as a side dish, and have vegetables or grains as your main dish. This means having meat in small portions or adding small amounts of meat to foods like pasta or stew.  Use beans or vegetables instead of meat in common dishes like chili or lasagna.  Experiment with different cooking methods. Try roasting or broiling vegetables instead of steaming or sauteing them.  Add frozen vegetables to soups, stews, pasta, or rice.  Add nuts or seeds for added healthy fat at each  meal. You can add these to yogurt, salads, or vegetable dishes.  Marinate fish or vegetables using olive oil, lemon juice, garlic, and fresh herbs. Meal planning  Plan to eat 1 vegetarian meal one day each week. Try to work up to 2 vegetarian meals, if possible.  Eat seafood 2 or more times a week.  Have healthy snacks readily available, such as: ? Vegetable sticks with hummus. ? [place] yogurt. ? Fruit and nut trail mix.  Eat balanced meals throughout the week. This includes: ? Fruit: 2-3 servings a day ? Vegetables: 4-5 servings a day ? Low-fat dairy: 2 servings a day ? Fish, poultry, or lean meat: 1 serving a day ? Beans and legumes: 2 or more servings a week ? Nuts and seeds: 1-2 servings a day ? Whole grains: 6-8 servings a day ? Extra-virgin olive oil: 3-4 servings a day  Limit red meat and sweets to only a few servings a month   What are my food choices?  Mediterranean diet ? Recommended  Grains: Whole-grain pasta. Brown rice. Bulgar wheat. Polenta. Couscous. Whole-wheat bread. Orpah Cobbatmeal. Quinoa.  Vegetables: Artichokes. Beets. Broccoli. Cabbage. Carrots. Eggplant. Green beans. Chard. Kale. Spinach. Onions. Leeks. Peas. Squash. Tomatoes. Peppers. Radishes.  Fruits: Apples. Apricots. Avocado. Berries. Bananas. Cherries. Dates. Figs. Grapes. Lemons. Melon. Oranges. Peaches. Plums. Pomegranate.  Meats and other protein foods: Beans. Almonds. Sunflower seeds. Pine nuts. Peanuts. Cod. Salmon. Scallops. Shrimp. Tuna. Tilapia. Clams. Oysters. Eggs.  Dairy: Low-fat milk. Cheese. Greek yogurt.  Beverages: Water. Red wine. Herbal tea.  Fats and oils: Extra virgin olive oil. Avocado oil. Grape seed oil.  Sweets and desserts: [place] yogurt with honey. Baked apples. Poached pears. Trail mix.  Seasoning and other foods: Basil. Cilantro. Coriander. Cumin. Mint. Parsley. Sage. Rosemary. Tarragon. Garlic. Oregano. Thyme. Pepper. Balsalmic vinegar. Tahini. Hummus. Tomato sauce. Olives.  Mushrooms. ? Limit these  Grains: Prepackaged pasta or rice dishes. Prepackaged cereal with added sugar.  Vegetables: Deep fried potatoes (french fries).  Fruits: Fruit canned in syrup.  Meats and other protein foods: Beef. Pork. Lamb. Poultry with skin. Hot dogs. Tomasa BlaseBacon.  Dairy: Ice cream. Sour cream. Whole milk.  Beverages: Juice. Sugar-sweetened soft drinks. Beer. Liquor and spirits.  Fats and oils: Butter. Canola oil. Vegetable oil.  Beef fat (tallow). Lard.  Sweets and desserts: Cookies. Cakes. Pies. Candy.  Seasoning and other foods: Mayonnaise. Premade sauces and marinades. The items listed may not be a complete list. Talk with your dietitian about what dietary choices are right for you. Summary  The Mediterranean diet includes both food and lifestyle choices.  Eat a variety of fresh fruits and vegetables, beans, nuts, seeds, and whole grains.  Limit the amount of red meat and sweets that you eat.  Talk with your health care provider about whether it is safe for you to drink red wine in moderation. This means 1 glass a day for nonpregnant women and 2 glasses a day for men. A glass of wine equals 5 oz (150 mL). This information is not intended to replace advice given to you by your health care provider. Make sure you discuss any questions you have with your health care provider. Document Revised: 11/11/2015 Document Reviewed: 11/04/2015 Elsevier Patient Education  2020 [employer].     Adopting a Healthy Lifestyle.  Know what a healthy weight is for you (roughly BMI <25) and aim to maintain this   Aim for 7+ servings of fruits and vegetables daily   65-80+ fluid ounces of water or unsweet tea for healthy kidneys   Limit to max 1 drink of alcohol per day; avoid smoking/tobacco   Limit animal fats in diet for cholesterol and heart health - choose grass fed whenever available   Avoid highly processed foods, and foods high in saturated/trans fats   Aim for low  stress - take time to unwind and care for your mental health   Aim for 150 min of moderate intensity exercise weekly for heart health, and weights twice weekly for bone health   Aim for 7-9 hours of sleep daily   When it comes to diets, agreement about the perfect plan isnt easy to find, even among the experts. Experts at the [facility] [employer] developed an idea known as the Healthy Eating Plate. Just imagine a plate divided into logical, healthy portions.   The emphasis is on diet quality:   Load up on vegetables and fruits - one-half of your plate: Aim for color and variety, and remember that potatoes dont count.   Go for whole grains - one-quarter of your plate: Whole wheat, barley, wheat berries, quinoa, oats, brown rice, and foods made with them. If you want pasta, go with whole wheat pasta.   Protein power - one-quarter of your plate: Fish, chicken, beans, and nuts are all healthy, versatile protein sources. Limit red meat.   The diet, however, does go beyond the plate, offering a few other suggestions.   Use healthy plant oils, such as olive, canola, soy, corn, sunflower and peanut. Check the labels, and avoid partially hydrogenated oil, which have unhealthy trans fats.   If youre thirsty, drink water. Coffee and tea are good in moderation, but skip sugary drinks and limit milk and dairy products to one or two daily servings.   The type of carbohydrate in the diet is more important than the amount. Some sources of carbohydrates, such as vegetables, fruits, whole grains, and beans-are healthier than others.   Finally, stay active  Signed, Thomasene Ripple, DO  06/23/2020 2:39 PM    [facility]

## 2020-06-23 NOTE — Patient Instructions (Addendum)
Medication Instructions:  Your physician has recommended you make the following change in your medication: INCREASE: Lasix 40 mg twice a day for 5 days than 40 mg once daily.  INCREASE:  Potassium 20 meq twice a day for 5 days than 20 meq once daily  *If you need a refill on your cardiac medications before your next appointment, please call your pharmacy*   Lab Work: None If you have labs (blood work) drawn today and your tests are completely normal, you will receive your results only by: Marland Kitchen MyChart Message (if you have MyChart) OR . A paper copy in the mail If you have any lab test that is abnormal or we need to change your treatment, we will call you to review the results.   Testing/Procedures: Your physician has requested that you have an echocardiogram. Echocardiography is a painless test that uses sound waves to create images of your heart. It provides your doctor with information about the size and shape of your heart and how well your heart's chambers and valves are working. This procedure takes approximately one hour. There are no restrictions for this procedure.  Your physician has requested that you have a lexiscan myoview. For further information please visit [email or web address] Please follow instruction sheet, as given.  Follow-Up: At [facility], you and your health needs are our priority.  As part of our continuing mission to provide you with exceptional heart care, we have created designated Provider Care Teams.  These Care Teams include your primary Cardiologist (physician) and Advanced Practice Providers (APPs -  Physician Assistants and Nurse Practitioners) who all work together to provide you with the care you need, when you need it.  We recommend signing up for the patient portal called "MyChart".  Sign up information is provided on this After Visit Summary.  MyChart is used to connect with patients for Virtual Visits (Telemedicine).  Patients are able to view lab/test  results, encounter notes, upcoming appointments, etc.  Non-urgent messages can be sent to your provider as well.   To learn more about what you can do with MyChart, go to [email or web address].    Your next appointment:   2 week(s)  The format for your next appointment:   In Person  Provider:   Thomasene Ripple, DO   Other Instructions  Cardiac Nuclear Scan A cardiac nuclear scan is a test that is done to check the flow of blood to your heart. It is done when you are resting and when you are exercising. The test looks for problems such as:  Not enough blood reaching a portion of the heart.  The heart muscle not working as it should. You may need this test if:  You have heart disease.  You have had lab results that are not normal.  You have had heart surgery or a balloon procedure to open up blocked arteries (angioplasty).  You have chest pain.  You have shortness of breath. In this test, a special dye (tracer) is put into your bloodstream. The tracer will travel to your heart. A camera will then take pictures of your heart to see how the tracer moves through your heart. This test is usually done at a hospital and takes 2-4 hours. Tell a doctor about:  Any allergies you have.  All medicines you are taking, including vitamins, herbs, eye drops, creams, and over-the-counter medicines.  Any problems you or family members have had with anesthetic medicines.  Any blood disorders you have.  Any surgeries you have had.  Any medical conditions you have.  Whether you are pregnant or may be pregnant. What are the risks? Generally, this is a safe test. However, problems may occur, such as:  Serious chest pain and heart attack. This is only a risk if the stress portion of the test is done.  Rapid heartbeat.  A feeling of warmth in your chest. This feeling usually does not last long.  Allergic reaction to the tracer. What happens before the test?  Ask your doctor about  changing or stopping your normal medicines. This is important.  Follow instructions from your doctor about what you cannot eat or drink.  Remove your jewelry on the day of the test. What happens during the test?  An IV tube will be inserted into one of your veins.  Your doctor will give you a small amount of tracer through the IV tube.  You will wait for 20-40 minutes while the tracer moves through your bloodstream.  Your heart will be monitored with an electrocardiogram (ECG).  You will lie down on an exam table.  Pictures of your heart will be taken for about 15-20 minutes.  You may also have a stress test. For this test, one of these things may be done: ? You will be asked to exercise on a treadmill or a stationary bike. ? You will be given medicines that will make your heart work harder. This is done if you are unable to exercise.  When blood flow to your heart has peaked, a tracer will again be given through the IV tube.  After 20-40 minutes, you will get back on the exam table. More pictures will be taken of your heart.  Depending on the tracer that is used, more pictures may need to be taken 3-4 hours later.  Your IV tube will be removed when the test is over. The test may vary among doctors and hospitals. What happens after the test?  Ask your doctor: ? Whether you can return to your normal schedule, including diet, activities, and medicines. ? Whether you should drink more fluids. This will help to remove the tracer from your body. Drink enough fluid to keep your pee (urine) pale yellow.  Ask your doctor, or the department that is doing the test: ? When will my results be ready? ? How will I get my results? Summary  A cardiac nuclear scan is a test that is done to check the flow of blood to your heart.  Tell your doctor whether you are pregnant or may be pregnant.  Before the test, ask your doctor about changing or stopping your normal medicines. This is  important.  Ask your doctor whether you can return to your normal activities. You may be asked to drink more fluids. This information is not intended to replace advice given to you by your health care provider. Make sure you discuss any questions you have with your health care provider. Document Revised: 07/03/2018 Document Reviewed: 08/27/2017 Elsevier Patient Education  2021 Elsevier Inc.  Echocardiogram An echocardiogram is a test that uses sound waves (ultrasound) to produce images of the heart. Images from an echocardiogram can provide important information about:  Heart size and shape.  The size and thickness and movement of your heart's walls.  Heart muscle function and strength.  Heart valve function or if you have stenosis. Stenosis is when the heart valves are too narrow.  If blood is flowing backward through the heart valves (regurgitation).  A tumor or infectious growth around  the heart valves.  Areas of heart muscle that are not working well because of poor blood flow or injury from a heart attack.  Aneurysm detection. An aneurysm is a weak or damaged part of an artery wall. The wall bulges out from the normal force of blood pumping through the body. Tell a health care provider about:  Any allergies you have.  All medicines you are taking, including vitamins, herbs, eye drops, creams, and over-the-counter medicines.  Any blood disorders you have.  Any surgeries you have had.  Any medical conditions you have.  Whether you are pregnant or may be pregnant. What are the risks? Generally, this is a safe test. However, problems may occur, including an allergic reaction to dye (contrast) that may be used during the test. What happens before the test? No specific preparation is needed. You may eat and drink normally. What happens during the test?  You will take off your clothes from the waist up and put on a hospital gown.  Electrodes or electrocardiogram  (ECG)patches may be placed on your chest. The electrodes or patches are then connected to a device that monitors your heart rate and rhythm.  You will lie down on a table for an ultrasound exam. A gel will be applied to your chest to help sound waves pass through your skin.  A handheld device, called a transducer, will be pressed against your chest and moved over your heart. The transducer produces sound waves that travel to your heart and bounce back (or "echo" back) to the transducer. These sound waves will be captured in real-time and changed into images of your heart that can be viewed on a video monitor. The images will be recorded on a computer and reviewed by your health care provider.  You may be asked to change positions or hold your breath for a short time. This makes it easier to get different views or better views of your heart.  In some cases, you may receive contrast through an IV in one of your veins. This can improve the quality of the pictures from your heart. The procedure may vary among health care providers and hospitals.   What can I expect after the test? You may return to your normal, everyday life, including diet, activities, and medicines, unless your health care provider tells you not to do that. Follow these instructions at home:  It is up to you to get the results of your test. Ask your health care provider, or the department that is doing the test, when your results will be ready.  Keep all follow-up visits. This is important. Summary  An echocardiogram is a test that uses sound waves (ultrasound) to produce images of the heart.  Images from an echocardiogram can provide important information about the size and shape of your heart, heart muscle function, heart valve function, and other possible heart problems.  You do not need to do anything to prepare before this test. You may eat and drink normally.  After the echocardiogram is completed, you may return to your  normal, everyday life, unless your health care provider tells you not to do that. This information is not intended to replace advice given to you by your health care provider. Make sure you discuss any questions you have with your health care provider. Document Revised: 11/04/2019 Document Reviewed: 11/04/2019 Elsevier Patient Education  2021 Elsevier Inc.   Mediterranean Diet A Mediterranean diet refers to food and lifestyle choices that are based on the  traditions of countries located on the [employer]. This way of eating has been shown to help prevent certain conditions and improve outcomes for people who have chronic diseases, like kidney disease and heart disease. What are tips for following this plan? Lifestyle  Cook and eat meals together with your family, when possible.  Drink enough fluid to keep your urine clear or pale yellow.  Be physically active every day. This includes: ? Aerobic exercise like running or swimming. ? Leisure activities like gardening, walking, or housework.  Get 7-8 hours of sleep each night.  If recommended by your health care provider, drink red wine in moderation. This means 1 glass a day for nonpregnant women and 2 glasses a day for men. A glass of wine equals 5 oz (150 mL). Reading food labels  Check the serving size of packaged foods. For foods such as rice and pasta, the serving size refers to the amount of cooked product, not dry.  Check the total fat in packaged foods. Avoid foods that have saturated fat or trans fats.  Check the ingredients list for added sugars, such as corn syrup.   Shopping  At the grocery store, buy most of your food from the areas near the walls of the store. This includes: ? Fresh fruits and vegetables (produce). ? Grains, beans, nuts, and seeds. Some of these may be available in unpackaged forms or large amounts (in bulk). ? Fresh seafood. ? Poultry and eggs. ? Low-fat dairy products.  Buy whole ingredients  instead of prepackaged foods.  Buy fresh fruits and vegetables in-season from local farmers markets.  Buy frozen fruits and vegetables in resealable bags.  If you do not have access to quality fresh seafood, buy precooked frozen shrimp or canned fish, such as tuna, salmon, or sardines.  Buy small amounts of raw or cooked vegetables, salads, or olives from the deli or salad bar at your store.  Stock your pantry so you always have certain foods on hand, such as olive oil, canned tuna, canned tomatoes, rice, pasta, and beans. Cooking  Cook foods with extra-virgin olive oil instead of using butter or other vegetable oils.  Have meat as a side dish, and have vegetables or grains as your main dish. This means having meat in small portions or adding small amounts of meat to foods like pasta or stew.  Use beans or vegetables instead of meat in common dishes like chili or lasagna.  Experiment with different cooking methods. Try roasting or broiling vegetables instead of steaming or sauteing them.  Add frozen vegetables to soups, stews, pasta, or rice.  Add nuts or seeds for added healthy fat at each meal. You can add these to yogurt, salads, or vegetable dishes.  Marinate fish or vegetables using olive oil, lemon juice, garlic, and fresh herbs. Meal planning  Plan to eat 1 vegetarian meal one day each week. Try to work up to 2 vegetarian meals, if possible.  Eat seafood 2 or more times a week.  Have healthy snacks readily available, such as: ? Vegetable sticks with hummus. ? [place] yogurt. ? Fruit and nut trail mix.  Eat balanced meals throughout the week. This includes: ? Fruit: 2-3 servings a day ? Vegetables: 4-5 servings a day ? Low-fat dairy: 2 servings a day ? Fish, poultry, or lean meat: 1 serving a day ? Beans and legumes: 2 or more servings a week ? Nuts and seeds: 1-2 servings a day ? Whole grains: 6-8 servings a day ?  Extra-virgin olive oil: 3-4 servings a day  Limit  red meat and sweets to only a few servings a month   What are my food choices?  Mediterranean diet ? Recommended  Grains: Whole-grain pasta. Brown rice. Bulgar wheat. Polenta. Couscous. Whole-wheat bread. Orpah Cobb.  Vegetables: Artichokes. Beets. Broccoli. Cabbage. Carrots. Eggplant. Green beans. Chard. Kale. Spinach. Onions. Leeks. Peas. Squash. Tomatoes. Peppers. Radishes.  Fruits: Apples. Apricots. Avocado. Berries. Bananas. Cherries. Dates. Figs. Grapes. Lemons. Melon. Oranges. Peaches. Plums. Pomegranate.  Meats and other protein foods: Beans. Almonds. Sunflower seeds. Pine nuts. Peanuts. Cod. Salmon. Scallops. Shrimp. Tuna. Tilapia. Clams. Oysters. Eggs.  Dairy: Low-fat milk. Cheese. Greek yogurt.  Beverages: Water. Red wine. Herbal tea.  Fats and oils: Extra virgin olive oil. Avocado oil. Grape seed oil.  Sweets and desserts: [place] yogurt with honey. Baked apples. Poached pears. Trail mix.  Seasoning and other foods: Basil. Cilantro. Coriander. Cumin. Mint. Parsley. Sage. Rosemary. Tarragon. Garlic. Oregano. Thyme. Pepper. Balsalmic vinegar. Tahini. Hummus. Tomato sauce. Olives. Mushrooms. ? Limit these  Grains: Prepackaged pasta or rice dishes. Prepackaged cereal with added sugar.  Vegetables: Deep fried potatoes (french fries).  Fruits: Fruit canned in syrup.  Meats and other protein foods: Beef. Pork. Lamb. Poultry with skin. Hot dogs. Tomasa Blase.  Dairy: Ice cream. Sour cream. Whole milk.  Beverages: Juice. Sugar-sweetened soft drinks. Beer. Liquor and spirits.  Fats and oils: Butter. Canola oil. Vegetable oil. Beef fat (tallow). Lard.  Sweets and desserts: Cookies. Cakes. Pies. Candy.  Seasoning and other foods: Mayonnaise. Premade sauces and marinades. The items listed may not be a complete list. Talk with your dietitian about what dietary choices are right for you. Summary  The Mediterranean diet includes both food and lifestyle choices.  Eat a variety  of fresh fruits and vegetables, beans, nuts, seeds, and whole grains.  Limit the amount of red meat and sweets that you eat.  Talk with your health care provider about whether it is safe for you to drink red wine in moderation. This means 1 glass a day for nonpregnant women and 2 glasses a day for men. A glass of wine equals 5 oz (150 mL). This information is not intended to replace advice given to you by your health care provider. Make sure you discuss any questions you have with your health care provider. Document Revised: 11/11/2015 Document Reviewed: 11/04/2015 Elsevier Patient Education  2020 [employer].

## 2020-06-25 ENCOUNTER — Ambulatory Visit
Admission: RE | Admit: 2020-06-25 | Discharge: 2020-06-25 | Disposition: A | Discharge status: Home / Self Care | Payer: Commercial Managed Care - PPO | Source: Ambulatory Visit | Attending: Physician Assistant | Admitting: Physician Assistant

## 2020-06-25 ENCOUNTER — Other Ambulatory Visit: Payer: Self-pay

## 2020-06-28 ENCOUNTER — Other Ambulatory Visit: Payer: Self-pay | Admitting: Physician Assistant

## 2020-06-28 DIAGNOSIS — G459 Transient cerebral ischemic attack, unspecified: Secondary | ICD-10-CM

## 2020-06-28 DIAGNOSIS — R9082 White matter disease, unspecified: Secondary | ICD-10-CM

## 2020-06-29 ENCOUNTER — Telehealth (HOSPITAL_COMMUNITY): Payer: Self-pay | Admitting: *Deleted

## 2020-06-29 NOTE — Telephone Encounter (Signed)
Left message on voicemail per DPR in reference to upcoming appointment scheduled on 07/06/2020 at 8:15 with detailed instructions given per Myocardial Perfusion Study Information Sheet for the test. LM to arrive 15 minutes early, and that it is imperative to arrive on time for appointment to keep from having the test rescheduled. If you need to cancel or reschedule your appointment, please call the office within 24 hours of your appointment. Failure to do so may result in a cancellation of your appointment, and a $50 no show fee. Phone number given for call back for any questions.

## 2020-06-29 NOTE — Telephone Encounter (Signed)
Patient's wife is returning call to go over instructions for stress test.

## 2020-06-30 ENCOUNTER — Telehealth: Payer: Self-pay | Admitting: Cardiology

## 2020-06-30 NOTE — Telephone Encounter (Signed)
Patient's wife would like to know if the patient needs to begin taking medication for his BP. She states he has not had any issues with his BP. She also reports he is asymptomatic. However, she is concerned that due to his condition and needing a stress test, she assumes he may need to be on medication for BP as well. She requested a call back with clarification. Please advise.

## 2020-07-01 NOTE — Telephone Encounter (Signed)
Spoke with patient's wife about the Lasix dose. She states he took the 40 mg twice a day for a week and now is taking the 40 mg once a day but is still swollen. Will really the message to Dr. Servando Salina for advise.

## 2020-07-02 ENCOUNTER — Telehealth: Payer: Self-pay

## 2020-07-02 DIAGNOSIS — Z79899 Other long term (current) drug therapy: Secondary | ICD-10-CM

## 2020-07-02 MED ORDER — METOLAZONE 5 MG PO TABS: 5.0000 mg | ORAL_TABLET | Freq: Every day | ORAL | 0 refills | Status: DC

## 2020-07-02 MED ORDER — FUROSEMIDE 40 MG PO TABS: 40.0000 mg | ORAL_TABLET | Freq: Every day | ORAL | 3 refills | Status: DC

## 2020-07-02 NOTE — Telephone Encounter (Signed)
Spoke with patient about taking Lasix 40 mg twice daily as well as taking Metolazone 30 minutes before his first dose of Lasix. Patient will be getting imaging done on Tuesday and will also get lab work drawn. Patient verbalizes understanding. Patient asked about redness noted to his legs. He was informed it may be due to the swelling and to monitor it. He verbalized understanding and thanked me for calling. No further questions or concerns at this time. Information relayed to Dr. Servando Salina. Prescriptions sent to pharmacy.

## 2020-07-02 NOTE — Telephone Encounter (Signed)
Have him take the 40 mg twice daily.  Also give metolazone 5 mg 30 minutes before his first Lasix dose for the next 5 days.  I will see him next week.  In addition have the patient come on Tuesday to get blood work for his BMP and [employer]

## 2020-07-02 NOTE — Telephone Encounter (Signed)
Spoke to patient, see telephone note.

## 2020-07-06 ENCOUNTER — Ambulatory Visit (INDEPENDENT_AMBULATORY_CARE_PROVIDER_SITE_OTHER): Payer: Commercial Managed Care - PPO

## 2020-07-06 ENCOUNTER — Other Ambulatory Visit: Payer: Self-pay

## 2020-07-06 DIAGNOSIS — R079 Chest pain, unspecified: Secondary | ICD-10-CM | POA: Diagnosis not present

## 2020-07-06 MED ORDER — REGADENOSON 0.4 MG/5ML IV SOLN
0.4000 mg | Freq: Once | INTRAVENOUS | Status: AC
  Administered 2020-07-06: 0.4 mg via INTRAVENOUS

## 2020-07-06 MED ORDER — TECHNETIUM TC 99M TETROFOSMIN IV KIT
32.7000 | PACK | Freq: Once | INTRAVENOUS | Status: AC | PRN
  Administered 2020-07-06: 32.7 via INTRAVENOUS

## 2020-07-06 MED ORDER — AMINOPHYLLINE 25 MG/ML IV SOLN
75.0000 mg | Freq: Once | INTRAVENOUS | Status: AC
  Administered 2020-07-06: 75 mg via INTRAVENOUS

## 2020-07-07 ENCOUNTER — Ambulatory Visit: Payer: Commercial Managed Care - PPO

## 2020-07-07 LAB — MYOCARDIAL PERFUSION IMAGING
LV dias vol: 80 mL (ref 62–150)
LV sys vol: 33 mL
Peak HR: 110 {beats}/min
Rest HR: 99 {beats}/min
SDS: 1
SRS: 2
SSS: 3
TID: 1.13

## 2020-07-07 MED ORDER — TECHNETIUM TC 99M TETROFOSMIN IV KIT
32.1000 | PACK | Freq: Once | INTRAVENOUS | Status: AC | PRN
  Administered 2020-07-07: 32.1 via INTRAVENOUS

## 2020-07-08 ENCOUNTER — Ambulatory Visit (INDEPENDENT_AMBULATORY_CARE_PROVIDER_SITE_OTHER): Payer: Commercial Managed Care - PPO | Admitting: Cardiology

## 2020-07-08 ENCOUNTER — Encounter: Payer: Self-pay | Admitting: Cardiology

## 2020-07-08 ENCOUNTER — Other Ambulatory Visit: Payer: Self-pay

## 2020-07-08 VITALS — BP 120/84 | HR 90 | Ht 64.0 in | Wt 289.4 lb

## 2020-07-08 DIAGNOSIS — I10 Essential (primary) hypertension: Secondary | ICD-10-CM

## 2020-07-08 DIAGNOSIS — I2510 Atherosclerotic heart disease of native coronary artery without angina pectoris: Secondary | ICD-10-CM

## 2020-07-08 DIAGNOSIS — R7303 Prediabetes: Secondary | ICD-10-CM

## 2020-07-08 DIAGNOSIS — G459 Transient cerebral ischemic attack, unspecified: Secondary | ICD-10-CM | POA: Diagnosis not present

## 2020-07-08 DIAGNOSIS — R600 Localized edema: Secondary | ICD-10-CM

## 2020-07-08 DIAGNOSIS — I5031 Acute diastolic (congestive) heart failure: Secondary | ICD-10-CM

## 2020-07-08 MED ORDER — NITROGLYCERIN 0.4 MG SL SUBL: 0.4000 mg | SUBLINGUAL_TABLET | SUBLINGUAL | 3 refills | Status: DC | PRN

## 2020-07-08 NOTE — Progress Notes (Signed)
Cardiology Office Note:    Date:  07/08/2020   ID:  Randall Rojas, DOB 10/30/1960, MRN [id number]  PCP:  Randall Sofia, PA-C  Cardiologist:  Thomasene Ripple, DO  Electrophysiologist:  None   Referring MD: Randall Sofia, PA-C   I feel the swelling is getting down to feel a lot better.  History of Present Illness:    Randall Rojas is a 60 y.o. male with a hx of nonobstructive coronary artery disease, hypertension, hyperlipidemia, obesity, diastolic heart failure is here today for follow-up visit.  Did see the patient initially on June 23, 2020 at that time he had some intermittent chest pain as well as shortness of breath.  He had significant leg edema which I started the patient on aggressive diuretics with Lasix 40 mg twice daily and a close follow-up.  In addition given his known coronary artery disease and Lexiscan was scheduled to understand there is any worsening or progression of his CAD.  In the interim he called requesting that the Lasix was not working we added metolazone 5 mg to his regimen.  The patient tells me he is feeling better from his swelling of the leg as well as the shortness of breath is improving.  The patient and his wife showed me a video where he had significant head jerking and upper and lower extremity jerking sensation.  It happens occasionally.  She says that their PCP had set them up with neurology but has not heard back.  Past Medical History:  Diagnosis Date  . Asthma   . Chest pain 04/15/2015  . Coronary artery calcification seen on CT scan 04/15/2015  . Coronary artery disease   . Essential hypertension 04/15/2015  . Hyperlipidemia 04/15/2015  . Morbid obesity (HCC) 04/15/2015  . TIA (transient ischemic attack)     Past Surgical History:  Procedure Laterality Date  . CARDIAC CATHETERIZATION N/A 04/22/2015   Procedure: Left Heart Cath and Coronary Angiography;  Surgeon: Randall M [place], MD;  Location: [facility] INVASIVE CV LAB;  Service: Cardiovascular;  Laterality:  N/A;  . GALLBLADDER SURGERY    . SINUS SURGERY WITH INSTATRAK      Current Medications: Current Meds  Medication Sig  . albuterol (VENTOLIN HFA) 108 (90 Base) MCG/ACT inhaler Inhale 2 puffs into the lungs every 6 (six) hours as needed for wheezing or shortness of breath.  . ASPIRIN 81 PO Take 1 tablet by mouth daily.  . diclofenac (VOLTAREN) 75 MG EC tablet Take 75 mg by mouth 2 (two) times daily.  . furosemide (LASIX) 40 MG tablet Take 1 tablet (40 mg total) by mouth daily.  . metolazone (ZAROXOLYN) 5 MG tablet Take 1 tablet (5 mg total) by mouth daily. Take 30 minutes before first dose of Lasix.  Marland Kitchen nitroGLYCERIN (NITROSTAT) 0.4 MG SL tablet Place 1 tablet (0.4 mg total) under the tongue every 5 (five) minutes as needed for chest pain.  . potassium chloride SA (KLOR-CON) 20 MEQ tablet Take 20 meq twice daily for 5 days then 20 meq once daily  . [DISCONTINUED] LORazepam (ATIVAN) 1 MG tablet Take 1 tablet (1 mg total) by mouth 2 (two) times daily as needed for sedation.     Allergies:   Cephalexin   Social History   Socioeconomic History  . Marital status: Married    Spouse name: Not on file  . Number of children: Not on file  . Years of education: Not on file  . Highest education level: Not on file  Occupational  History  . 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 Smoking status: Former Smoker    Quit date: 04/13/1989    Years since quitting: 31.2  . Smokeless tobacco: Never Used  Substance and Sexual Activity  . Alcohol use: Not on file  . Drug use: Not on file  . Sexual activity: Not on file  Other Topics Concern  . Not on file  Social History Narrative   Epworth Sleepiness Scale = 7 (as of 1/18/207)   Social Determinants of Health   Financial Resource Strain: Not on file  Food Insecurity: Not on file  Transportation Needs: Not on file  Physical Activity: Not on file  Stress: Not on file  Social Connections: Not on file     Family History: The patient's  family history includes Cancer in his brother and father; Heart attack in his father; Hypertension in his mother and sister; Rheum arthritis in his mother.  ROS:   Review of Systems  Constitution: Negative for decreased appetite, fever and weight gain.  HENT: Negative for congestion, ear discharge, hoarse voice and sore throat.   Eyes: Negative for discharge, redness, vision loss in right eye and visual halos.  Cardiovascular: Negative for chest pain, dyspnea on exertion, leg swelling, orthopnea and palpitations.  Respiratory: Negative for cough, hemoptysis, shortness of breath and snoring.   Endocrine: Negative for heat intolerance and polyphagia.  Hematologic/Lymphatic: Negative for bleeding problem. Does not bruise/bleed easily.  Skin: Negative for flushing, nail changes, rash and suspicious lesions.  Musculoskeletal: Negative for arthritis, joint pain, muscle cramps, myalgias, neck pain and stiffness.  Gastrointestinal: Negative for abdominal pain, bowel incontinence, diarrhea and excessive appetite.  Genitourinary: Negative for decreased libido, genital sores and incomplete emptying.  Neurological: Negative for brief paralysis, focal weakness, headaches and loss of balance.  Psychiatric/Behavioral: Negative for altered mental status, depression and suicidal ideas.  Allergic/Immunologic: Negative for HIV exposure and persistent infections.    EKGs/Labs/Other Studies Reviewed:    The following studies were reviewed today:   EKG: None today  Pharmacologic nuclear stress test July 06, 2020  Nuclear stress EF: 59%.  The left ventricular ejection fraction is normal (55-65%).  There was no ST segment deviation noted during stress.  Defect 1: There is a small defect of mild severity present in the apical inferior location.  This is a low risk study.  No evidence of ischemia or MI.  Fixed defect involving apical portion of the inferior wall represents attenuation.    Recent  Labs: 06/17/2020: ALT 22; BUN 17; Creatinine, Ser 1.01; Hemoglobin 14.9; Platelets 170; Potassium 5.2; Sodium 139; TSH 0.970  Recent Lipid Panel    Component Value Date/Time   CHOL 190 06/17/2020 1034   TRIG 178 (H) 06/17/2020 1034   HDL 38 (L) 06/17/2020 1034   CHOLHDL 5.0 06/17/2020 1034   CHOLHDL 6.2 04/07/2019 0334   VLDL 16 04/07/2019 0334   LDLCALC 120 (H) 06/17/2020 1034    Physical Exam:    VS:  BP 120/84   Pulse 90   Ht 5\' 4"  (1.626 m)   Wt 289 lb 6.4 oz (131.3 kg)   SpO2 96%   BMI 49.68 kg/m     Wt Readings from Last 3 Encounters:  07/08/20 289 lb 6.4 oz (131.3 kg)  07/06/20 297 lb (134.7 kg)  06/23/20 297 lb 3.2 oz (134.8 kg)     GEN: Well nourished, well developed in no acute distress HEENT: Normal NECK: No JVD; No carotid bruits LYMPHATICS: No  lymphadenopathy CARDIAC: S1S2 noted,RRR, no murmurs, rubs, gallops RESPIRATORY:  Clear to auscultation without rales, wheezing or rhonchi  ABDOMEN: Soft, non-tender, non-distended, +bowel sounds, no guarding. EXTREMITIES: +2 bilateral edema, No cyanosis, no clubbing MUSCULOSKELETAL:  No deformity  SKIN: Warm and dry NEUROLOGIC:  Alert and oriented x 3, non-focal PSYCHIATRIC:  Normal affect, good insight  ASSESSMENT:    1. Acute diastolic heart failure (HCC)   2. Coronary artery disease involving native coronary artery of native heart without angina pectoris   3. Essential hypertension   4. TIA (transient ischemic attack)   5. Morbid obesity (HCC)   6. Bilateral leg edema   7. Prediabetes    PLAN:     He has had some improvement with his current diuretic dose, his last visit the patient was 297 pounds today he is 289 pounds.  He does have some leg edema that I think still needs to him off but at this point he does not need to be transitioned for IV diuretics.  He will stay on the Lasix 40 mg twice daily for now.  Blood work will be done to assess his creatinine as well as his electrolytes.  We discussed his  nuclear stress test results which was negative for any ischemia.  He does have coronary artery disease I advised him if he has repeated chest pain to use his nitroglycerin.  He will notify my office when the need arises and he uses multiple nitroglycerin as we will reassess the need for long-acting nitro use.  In regards to his video that was shown to me with his jerking the upper and lower extremities.  The patient tells me that her PCP had referred him to neurology but they are still waiting on a call.  I have asked the patient to notify the PCP office telling them that they have not heard back from neurology.  His echocardiogram is still pending  The patient understands the need to lose weight with diet and exercise. We have discussed specific strategies for this.  Will get hemoglobin A1c last time in 2021 it was 5.8 this has not been reassessed.  The patient is in agreement with the above plan. The patient left the office in stable condition.  The patient will follow up in 4 weeks due to medication change   Medication Adjustments/Labs and Tests Ordered: Current medicines are reviewed at length with the patient today.  Concerns regarding medicines are outlined above.  Orders Placed This Encounter  Procedures  . Basic metabolic panel  . Magnesium  . Hemoglobin A1c   Meds ordered this encounter  Medications  . nitroGLYCERIN (NITROSTAT) 0.4 MG SL tablet    Sig: Place 1 tablet (0.4 mg total) under the tongue every 5 (five) minutes as needed for chest pain.    Dispense:  90 tablet    Refill:  3    Patient Instructions  Medication Instructions:  Your physician has recommended you make the following change in your medication: START: Nitroglycerin 0.4 mg take one tablet by mouth every 5 minutes up to three times as needed for chest pain.  *If you need a refill on your cardiac medications before your next appointment, please call your pharmacy*   Lab Work: Your physician recommends  that you return for lab work: TODAY: BMET, Mag, HbA1C If you have labs (blood work) drawn today and your tests are completely normal, you will receive your results only by: Marland Kitchen MyChart Message (if you have MyChart) OR . A paper  copy in the mail If you have any lab test that is abnormal or we need to change your treatment, we will call you to review the results.   Testing/Procedures: None   Follow-Up: At [facility], you and your health needs are our priority.  As part of our continuing mission to provide you with exceptional heart care, we have created designated Provider Care Teams.  These Care Teams include your primary Cardiologist (physician) and Advanced Practice Providers (APPs -  Physician Assistants and Nurse Practitioners) who all work together to provide you with the care you need, when you need it.  We recommend signing up for the patient portal called "MyChart".  Sign up information is provided on this After Visit Summary.  MyChart is used to connect with patients for Virtual Visits (Telemedicine).  Patients are able to view lab/test results, encounter notes, upcoming appointments, etc.  Non-urgent messages can be sent to your provider as well.   To learn more about what you can do with MyChart, go to [email or web address].    Your next appointment:   4 week(s)  The format for your next appointment:   In Person  Provider:   Thomasene Ripple, DO   Other Instructions      Adopting a Healthy Lifestyle.  Know what a healthy weight is for you (roughly BMI <25) and aim to maintain this   Aim for 7+ servings of fruits and vegetables daily   65-80+ fluid ounces of water or unsweet tea for healthy kidneys   Limit to max 1 drink of alcohol per day; avoid smoking/tobacco   Limit animal fats in diet for cholesterol and heart health - choose grass fed whenever available   Avoid highly processed foods, and foods high in saturated/trans fats   Aim for low stress - take time  to unwind and care for your mental health   Aim for 150 min of moderate intensity exercise weekly for heart health, and weights twice weekly for bone health   Aim for 7-9 hours of sleep daily   When it comes to diets, agreement about the perfect plan isnt easy to find, even among the experts. Experts at the [facility] of [employer] developed an idea known as the Healthy Eating Plate. Just imagine a plate divided into logical, healthy portions.   The emphasis is on diet quality:   Load up on vegetables and fruits - one-half of your plate: Aim for color and variety, and remember that potatoes dont count.   Go for whole grains - one-quarter of your plate: Whole wheat, barley, wheat berries, quinoa, oats, brown rice, and foods made with them. If you want pasta, go with whole wheat pasta.   Protein power - one-quarter of your plate: Fish, chicken, beans, and nuts are all healthy, versatile protein sources. Limit red meat.   The diet, however, does go beyond the plate, offering a few other suggestions.   Use healthy plant oils, such as olive, canola, soy, corn, sunflower and peanut. Check the labels, and avoid partially hydrogenated oil, which have unhealthy trans fats.   If youre thirsty, drink water. Coffee and tea are good in moderation, but skip sugary drinks and limit milk and dairy products to one or two daily servings.   The type of carbohydrate in the diet is more important than the amount. Some sources of carbohydrates, such as vegetables, fruits, whole grains, and beans-are healthier than others.   Finally, stay active  Signed, Thomasene Ripple, DO  07/08/2020  11:48 AM    [facility]

## 2020-07-08 NOTE — Patient Instructions (Signed)
Medication Instructions:  Your physician has recommended you make the following change in your medication: START: Nitroglycerin 0.4 mg take one tablet by mouth every 5 minutes up to three times as needed for chest pain.  *If you need a refill on your cardiac medications before your next appointment, please call your pharmacy*   Lab Work: Your physician recommends that you return for lab work: TODAY: BMET, Mag, HbA1C If you have labs (blood work) drawn today and your tests are completely normal, you will receive your results only by: Marland Kitchen MyChart Message (if you have MyChart) OR . A paper copy in the mail If you have any lab test that is abnormal or we need to change your treatment, we will call you to review the results.   Testing/Procedures: None   Follow-Up: At [facility], you and your health needs are our priority.  As part of our continuing mission to provide you with exceptional heart care, we have created designated Provider Care Teams.  These Care Teams include your primary Cardiologist (physician) and Advanced Practice Providers (APPs -  Physician Assistants and Nurse Practitioners) who all work together to provide you with the care you need, when you need it.  We recommend signing up for the patient portal called "MyChart".  Sign up information is provided on this After Visit Summary.  MyChart is used to connect with patients for Virtual Visits (Telemedicine).  Patients are able to view lab/test results, encounter notes, upcoming appointments, etc.  Non-urgent messages can be sent to your provider as well.   To learn more about what you can do with MyChart, go to [email or web address].    Your next appointment:   4 week(s)  The format for your next appointment:   In Person  Provider:   Thomasene Ripple, DO   Other Instructions

## 2020-07-09 LAB — BASIC METABOLIC PANEL
BUN/Creatinine Ratio: 21 — ABNORMAL HIGH (ref 9–20)
BUN: 25 mg/dL — ABNORMAL HIGH (ref 6–24)
CO2: 26 mmol/L (ref 20–29)
Calcium: 9.5 mg/dL (ref 8.7–10.2)
Chloride: 92 mmol/L — ABNORMAL LOW (ref 96–106)
Creatinine, Ser: 1.21 mg/dL (ref 0.76–1.27)
Glucose: 116 mg/dL — ABNORMAL HIGH (ref 65–99)
Potassium: 3.3 mmol/L — ABNORMAL LOW (ref 3.5–5.2)
Sodium: 140 mmol/L (ref 134–144)
eGFR: 69 mL/min/{1.73_m2} (ref >59)

## 2020-07-09 LAB — HEMOGLOBIN A1C
Est. average glucose Bld gHb Est-mCnc: 128 mg/dL
Hgb A1c MFr Bld: 6.1 % — ABNORMAL HIGH (ref 4.8–5.6)

## 2020-07-09 LAB — MAGNESIUM: Magnesium: 2.2 mg/dL (ref 1.6–2.3)

## 2020-07-11 DIAGNOSIS — I2510 Atherosclerotic heart disease of native coronary artery without angina pectoris: Secondary | ICD-10-CM

## 2020-07-11 DIAGNOSIS — E785 Hyperlipidemia, unspecified: Secondary | ICD-10-CM

## 2020-07-11 DIAGNOSIS — I5032 Chronic diastolic (congestive) heart failure: Secondary | ICD-10-CM

## 2020-07-11 DIAGNOSIS — G459 Transient cerebral ischemic attack, unspecified: Secondary | ICD-10-CM | POA: Insufficient documentation

## 2020-07-11 HISTORY — DX: Chronic diastolic (congestive) heart failure: I50.32

## 2020-07-11 HISTORY — DX: Atherosclerotic heart disease of native coronary artery without angina pectoris: I25.10

## 2020-07-11 HISTORY — DX: Hyperlipidemia, unspecified: E78.5

## 2020-07-15 ENCOUNTER — Other Ambulatory Visit: Payer: Commercial Managed Care - PPO

## 2020-07-20 ENCOUNTER — Other Ambulatory Visit: Payer: Self-pay

## 2020-07-20 ENCOUNTER — Encounter: Payer: Self-pay | Admitting: Physician Assistant

## 2020-07-20 ENCOUNTER — Ambulatory Visit (INDEPENDENT_AMBULATORY_CARE_PROVIDER_SITE_OTHER): Payer: Commercial Managed Care - PPO | Admitting: Physician Assistant

## 2020-07-20 VITALS — BP 122/72 | HR 67 | Temp 97.2°F | Ht 64.0 in | Wt 295.2 lb

## 2020-07-20 DIAGNOSIS — E876 Hypokalemia: Secondary | ICD-10-CM

## 2020-07-20 DIAGNOSIS — G459 Transient cerebral ischemic attack, unspecified: Secondary | ICD-10-CM | POA: Diagnosis not present

## 2020-07-20 DIAGNOSIS — G4739 Other sleep apnea: Secondary | ICD-10-CM

## 2020-07-20 DIAGNOSIS — R519 Headache, unspecified: Secondary | ICD-10-CM | POA: Insufficient documentation

## 2020-07-20 HISTORY — DX: Hypokalemia: E87.6

## 2020-07-20 HISTORY — DX: Other sleep apnea: G47.39

## 2020-07-20 HISTORY — DX: Headache, unspecified: R51.9

## 2020-07-20 NOTE — Progress Notes (Signed)
Subjective:  Patient ID: Randall Rojas, male    DOB: 1960-05-23  Age: 60 y.o. MRN: [id number]  Chief Complaint  Patient presents with  . 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 - he was started on lipitor 80mg  qd He had MRI repeated which again showed the advanced white matter but with TIA history has been placed on Plavix 75mg  qd with asa 81mg  qd - this therapy is recommended for 3 months and then to switch to ASA 325mg  qd Pt did have a TTE while inpatient which showed normal ejection fraction and valves  Neurologist placed patient on propanolol 20mg  tid for headaches which pt states he is tolerating well and not having headaches at this time  Pt was found to have hypkalemia while in hospital - he was told to only take lasix 40mg  prn and to hold until labwork repeated today  Pt was advised to schedule for sleep study due to his current symptoms, somnolence and fatigue as well Current Outpatient Medications on File Prior to Visit  Medication Sig Dispense Refill  . albuterol (VENTOLIN HFA) 108 (90 Base) MCG/ACT inhaler Inhale 2 puffs into the lungs every 6 (six) hours as needed for wheezing or shortness of breath. 8 g 0  . ASPIRIN 81 PO Take 1 tablet by mouth daily.    atorvastatin (LIPITOR) 80 MG tablet Take 1 tablet by mouth daily.    . clopidogrel (PLAVIX) 75 MG tablet Take 1 tablet by mouth daily.    . cyanocobalamin 1000 MCG tablet Take 1 tablet by mouth daily.    . furosemide (LASIX) 40 MG tablet Take 1 tablet (40 mg total) by mouth daily. (Patient taking differently: Take 40 mg by mouth daily as needed.) 90 tablet 3  . nitroGLYCERIN (NITROSTAT) 0.4 MG SL tablet Place 1 tablet (0.4 mg  total) under the tongue every 5 (five) minutes as needed for chest pain. 90 tablet 3  . potassium chloride SA (KLOR-CON) 20 MEQ tablet Take 20 meq twice daily for 5 days then 20 meq once daily 90 tablet 3  . propranolol (INDERAL) 20 MG tablet Take 20 mg by mouth 3 (three) times daily.     No current facility-administered medications on file prior to visit.   Past Medical History:  Diagnosis Date  . Asthma   . Chest pain 04/15/2015  . Coronary artery calcification seen on CT scan 04/15/2015  . Coronary artery disease   . Essential hypertension 04/15/2015  . Hyperlipidemia 04/15/2015  . Morbid obesity (HCC) 04/15/2015  . TIA (transient ischemic attack)    Past Surgical History:  Procedure Laterality Date  . CARDIAC CATHETERIZATION N/A 04/22/2015   Procedure: Left Heart Cath and Coronary Angiography;  Surgeon: Peter M 04/17/2015, MD;  Location: [facility] INVASIVE CV LAB;  Service: Cardiovascular;  Laterality: N/A;  . GALLBLADDER SURGERY    . SINUS SURGERY WITH INSTATRAK      Family History  Problem Relation Age of Onset  . Hypertension Mother   . Rheum arthritis Mother   . Cancer Father   . Heart attack Father   . Cancer Brother   . Hypertension Sister    Social History  Socioeconomic History  . Marital status: Married    Spouse name: Not on file  . Number of children: Not on file  . Years of education: Not on file  . Highest education level: Not on file  Occupational History  . 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 Smoking status: Former Smoker    Quit date: 04/13/1989    Years since quitting: 31.2  . Smokeless tobacco: Never Used  Substance and Sexual Activity  . Alcohol use: Not on file  . Drug use: Not on file  . Sexual activity: Not on file  Other Topics Concern  . Not on file  Social History Narrative   Epworth Sleepiness Scale = 7 (as of 1/18/207)   Social Determinants of Health   Financial Resource Strain: Not on file  Food Insecurity: Not on file   Transportation Needs: Not on file  Physical Activity: Not on file  Stress: Not on file  Social Connections: Not on file    Review of Systems CONSTITUTIONAL: see HPI E/N/T: Negative for ear pain, nasal congestion and sore throat.  CARDIOVASCULAR: Negative for chest pain, dizziness, palpitations and pedal edema.  RESPIRATORY: Negative for recent cough and dyspnea.  GASTROINTESTINAL: Negative for abdominal pain, acid reflux symptoms, constipation, diarrhea, nausea and vomiting.  MSK: Negative for arthralgias and myalgias.  INTEGUMENTARY: Negative for rash.  NEUROLOGICAL: see HPI PSYCHIATRIC: Negative for sleep disturbance and to question depression screen.  Negative for depression, negative for anhedonia.       Objective:  BP 122/72 (BP Location: Left Arm, Patient Position: Sitting, Cuff Size: Large)   Pulse 67   Temp (!) 97.2 F (36.2 C) (Temporal)   Ht 5\' 4"  (1.626 m)   Wt 295 lb 3.2 oz (133.9 kg)   SpO2 96%   BMI 50.67 kg/m   BP/Weight 07/20/2020 07/08/2020 06/23/2020  Systolic BP 122 120 132  Diastolic BP 72 84 84  Wt. (Lbs) 295.2 289.4 297.2  BMI 50.67 49.68 51.01    Physical Exam PHYSICAL EXAM:   VS: BP 122/72 (BP Location: Left Arm, Patient Position: Sitting, Cuff Size: Large)   Pulse 67   Temp (!) 97.2 F (36.2 C) (Temporal)   Ht 5\' 4"  (1.626 m)   Wt 295 lb 3.2 oz (133.9 kg)   SpO2 96%   BMI 50.67 kg/m   GEN: Well nourished, well developed, in no acute distress  Cardiac: RRR; no murmurs, rubs, or gallops,no edema - no significant varicosities Respiratory:  normal respiratory rate and pattern with no distress - normal breath sounds with no rales, rhonchi, wheezes or rubs MS: no deformity or atrophy -- mild weakness noted on left side Skin: warm and dry, no rash  Neuro:  Alert and Oriented x 3, - CN II-Xii grossly intact Psych: euthymic mood, appropriate affect and demeanor  Diabetic Foot Exam - Simple   No data filed      Lab Results  Component Value  Date   WBC 7.7 06/17/2020   HGB 14.9 06/17/2020   HCT 44.5 06/17/2020   PLT 170 06/17/2020   GLUCOSE 116 (H) 07/08/2020   CHOL 190 06/17/2020   TRIG 178 (H) 06/17/2020   HDL 38 (L) 06/17/2020   LDLCALC 120 (H) 06/17/2020   ALT 22 06/17/2020   AST 20 06/17/2020   NA 140 07/08/2020   K 3.3 (L) 07/08/2020   CL 92 (L) 07/08/2020   CREATININE 1.21 07/08/2020   BUN 25 (H) 07/08/2020   CO2 26 07/08/2020  TSH 0.970 06/17/2020   INR 0.92 04/14/2015   HGBA1C 6.1 (H) 07/08/2020      Assessment & Plan:   1. TIA (transient ischemic attack) Continue meds and follow up with cardiology (5/12) and neurology (5/4) as scheduled 2. Hypokalemia - Comprehensive metabolic panel  3. Frequent headaches Continue propranolol as directed 4. Other sleep apnea  will order sleep study  No orders of the defined types were placed in this encounter.   Orders Placed This Encounter  Procedures  . Comprehensive metabolic panel     Follow-up: Return for pt has follow up appt scheduled in July.  An After Visit Summary was printed and given to the patient.  Jettie Pagan [facility] [phone]

## 2020-07-21 LAB — COMPREHENSIVE METABOLIC PANEL
ALT: 30 IU/L (ref 0–44)
AST: 17 IU/L (ref 0–40)
Albumin/Globulin Ratio: 1.2 (ref 1.2–2.2)
Albumin: 3.7 g/dL — ABNORMAL LOW (ref 3.8–4.9)
Alkaline Phosphatase: 89 IU/L (ref 44–121)
BUN/Creatinine Ratio: 18 (ref 9–20)
BUN: 19 mg/dL (ref 6–24)
Bilirubin Total: 0.5 mg/dL (ref 0.0–1.2)
CO2: 26 mmol/L (ref 20–29)
Calcium: 8.8 mg/dL (ref 8.7–10.2)
Chloride: 101 mmol/L (ref 96–106)
Creatinine, Ser: 1.06 mg/dL (ref 0.76–1.27)
Globulin, Total: 3 g/dL (ref 1.5–4.5)
Glucose: 90 mg/dL (ref 65–99)
Potassium: 5 mmol/L (ref 3.5–5.2)
Sodium: 141 mmol/L (ref 134–144)
Total Protein: 6.7 g/dL (ref 6.0–8.5)
eGFR: 81 mL/min/{1.73_m2} (ref >59)

## 2020-07-25 DIAGNOSIS — R200 Anesthesia of skin: Secondary | ICD-10-CM

## 2020-07-25 HISTORY — DX: Anesthesia of skin: R20.0

## 2020-08-03 ENCOUNTER — Other Ambulatory Visit: Payer: Self-pay

## 2020-08-05 ENCOUNTER — Ambulatory Visit (INDEPENDENT_AMBULATORY_CARE_PROVIDER_SITE_OTHER): Payer: Commercial Managed Care - PPO | Admitting: Cardiology

## 2020-08-05 ENCOUNTER — Other Ambulatory Visit: Payer: Self-pay

## 2020-08-05 ENCOUNTER — Telehealth: Payer: Self-pay | Admitting: Cardiology

## 2020-08-05 ENCOUNTER — Ambulatory Visit (INDEPENDENT_AMBULATORY_CARE_PROVIDER_SITE_OTHER): Payer: Commercial Managed Care - PPO

## 2020-08-05 ENCOUNTER — Encounter: Payer: Self-pay | Admitting: Cardiology

## 2020-08-05 VITALS — BP 136/82 | HR 78 | Ht 64.0 in | Wt 298.0 lb

## 2020-08-05 DIAGNOSIS — R002 Palpitations: Secondary | ICD-10-CM

## 2020-08-05 DIAGNOSIS — I5032 Chronic diastolic (congestive) heart failure: Secondary | ICD-10-CM

## 2020-08-05 DIAGNOSIS — I10 Essential (primary) hypertension: Secondary | ICD-10-CM

## 2020-08-05 DIAGNOSIS — R600 Localized edema: Secondary | ICD-10-CM

## 2020-08-05 DIAGNOSIS — Z8673 Personal history of transient ischemic attack (TIA), and cerebral infarction without residual deficits: Secondary | ICD-10-CM

## 2020-08-05 HISTORY — DX: Chronic diastolic (congestive) heart failure: I50.32

## 2020-08-05 HISTORY — DX: Palpitations: R00.2

## 2020-08-05 HISTORY — DX: Essential (primary) hypertension: I10

## 2020-08-05 HISTORY — DX: Personal history of transient ischemic attack (TIA), and cerebral infarction without residual deficits: Z86.73

## 2020-08-05 MED ORDER — POTASSIUM CHLORIDE CRYS ER 20 MEQ PO TBCR: 20.0000 meq | EXTENDED_RELEASE_TABLET | Freq: Every day | ORAL | 3 refills | Status: DC

## 2020-08-05 NOTE — Telephone Encounter (Signed)
Randall Rojas is calling to inform Dr. Servando Salina that Fayrene Fearing had a stroke shortly after getting home from his appointment with her today. She states he was taken by ambulance to [facility] hospital due to being unresponsive when she tried to wake him up and get him to talk to her. He does still have the heart monitor on, she also advised she pushed the button when she found him, but is unsure if it recorded the occurrence or not. Please advise.

## 2020-08-05 NOTE — Progress Notes (Signed)
Cardiology Office Note:    Date:  08/05/2020   ID:  Randall Rojas, DOB 1960-07-21, MRN [id number]  PCP:  Marianne Sofia, PA-C  Cardiologist:  Thomasene Ripple, DO  Electrophysiologist:  None   Referring MD: Marianne Sofia, PA-C   I had a stroke and was in [place] [facility]  History of Present Illness:    Randall Rojas is a 60 y.o. male with a hx of coronary artery disease with recent nuclear stress test showing no evidence of ischemia, recent TIA the patient is on dual antiplatelet therapy, hypertension, hyperlipidemia.  Since I last saw the patient he has been seen twice at the [facility] hospital first for concern for stroke.  Both visits there was concern for TIA.  The second visit which was on July 11, 2020 the patient has significant headache along with left-sided weakness and numbness.  He was then admitted to the [facility] hospital.  During that time he underwent multiple testing modalities his MRI demonstrated no acute cranial abnormalities, his CTA showed 50% stenosis of the right ICA and less than 50% stenosis of the left ICA.  The patient did see neurology while he was in the hospital he was started on dual antiplatelet therapy for 3 months and then will transition to aspirin only.  He is now following with neurology.  He was started on statins as well.  While he was in the hospital he was started on propranolol for migraine prophylaxis which have been stopped by neurology since his transition to outpatient care.  He tells me that he has some intermittent fluttering of the heart but has not had any significant chest discomfort.  He reports some shortness of breath as well as leg swelling but is improving.  Past Medical History:  Diagnosis Date  . Asthma   . Chest pain 04/15/2015  . Coronary artery calcification seen on CT scan 04/15/2015  . Coronary artery disease   . Essential hypertension 04/15/2015  . Hyperlipidemia 04/15/2015  . Morbid obesity (HCC) 04/15/2015  .  TIA (transient ischemic attack)     Past Surgical History:  Procedure Laterality Date  . CARDIAC CATHETERIZATION N/A 04/22/2015   Procedure: Left Heart Cath and Coronary Angiography;  Surgeon: Peter M Swaziland, MD;  Location: [facility] INVASIVE CV LAB;  Service: Cardiovascular;  Laterality: N/A;  . GALLBLADDER SURGERY    . SINUS SURGERY WITH INSTATRAK      Current Medications: Current Meds  Medication Sig  . albuterol (VENTOLIN HFA) 108 (90 Base) MCG/ACT inhaler Inhale 2 puffs into the lungs every 6 (six) hours as needed for wheezing or shortness of breath.  . ASPIRIN 81 PO Take 1 tablet by mouth daily.  Marland Kitchen atorvastatin (LIPITOR) 80 MG tablet Take 1 tablet by mouth daily.  . baclofen (LIORESAL) 10 MG tablet Take 1-2 tablets by mouth every 6 (six) hours as needed for hallucinations.  . clopidogrel (PLAVIX) 75 MG tablet Take 1 tablet by mouth daily.  . cyanocobalamin 1000 MCG tablet Take 1 tablet by mouth daily.  . furosemide (LASIX) 40 MG tablet Take 1 tablet (40 mg total) by mouth daily.  Marland Kitchen levETIRAcetam (KEPPRA) 750 MG tablet Take 1 tablet by mouth in the morning and at bedtime.  . nitroGLYCERIN (NITROSTAT) 0.4 MG SL tablet Place 1 tablet (0.4 mg total) under the tongue every 5 (five) minutes as needed for chest pain.  . potassium chloride SA (KLOR-CON) 20 MEQ tablet Take 1 tablet (20 mEq total) by mouth daily.  . [  DISCONTINUED] potassium chloride SA (KLOR-CON) 20 MEQ tablet Take 20 meq twice daily for 5 days then 20 meq once daily  . [DISCONTINUED] propranolol (INDERAL) 20 MG tablet Take 20 mg by mouth 3 (three) times daily.     Allergies:   Cephalexin   Social History   Socioeconomic History  . Marital status: Married    Spouse name: Not on file  . Number of children: Not on file  . Years of education: Not on file  . Highest education level: Not on file  Occupational History  . 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 Smoking status: Former Smoker    Quit date: 04/13/1989     Years since quitting: 31.3  . Smokeless tobacco: Never Used  Substance and Sexual Activity  . Alcohol use: Not on file  . Drug use: Not on file  . Sexual activity: Not on file  Other Topics Concern  . Not on file  Social History Narrative   Epworth Sleepiness Scale = 7 (as of 1/18/207)   Social Determinants of Health   Financial Resource Strain: Not on file  Food Insecurity: Not on file  Transportation Needs: Not on file  Physical Activity: Not on file  Stress: Not on file  Social Connections: Not on file     Family History: The patient's family history includes Cancer in his brother and father; Heart attack in his father; Hypertension in his mother and sister; Rheum arthritis in his mother.  ROS:   Review of Systems  Constitution: Negative for decreased appetite, fever and weight gain.  HENT: Negative for congestion, ear discharge, hoarse voice and sore throat.   Eyes: Negative for discharge, redness, vision loss in right eye and visual halos.  Cardiovascular: Negative for chest pain, dyspnea on exertion, leg swelling, orthopnea and palpitations.  Respiratory: Negative for cough, hemoptysis, shortness of breath and snoring.   Endocrine: Negative for heat intolerance and polyphagia.  Hematologic/Lymphatic: Negative for bleeding problem. Does not bruise/bleed easily.  Skin: Negative for flushing, nail changes, rash and suspicious lesions.  Musculoskeletal: Negative for arthritis, joint pain, muscle cramps, myalgias, neck pain and stiffness.  Gastrointestinal: Negative for abdominal pain, bowel incontinence, diarrhea and excessive appetite.  Genitourinary: Negative for decreased libido, genital sores and incomplete emptying.  Neurological: Negative for brief paralysis, focal weakness, headaches and loss of balance.  Psychiatric/Behavioral: Negative for altered mental status, depression and suicidal ideas.  Allergic/Immunologic: Negative for HIV exposure and persistent infections.     EKGs/Labs/Other Studies Reviewed:    The following studies were reviewed today:   EKG: None today  Pharmacologic nuclear stress test.  Nuclear stress EF: 59%.  The left ventricular ejection fraction is normal (55-65%).  There was no ST segment deviation noted during stress.  Defect 1: There is a small defect of mild severity present in the apical inferior location.  This is a low risk study.  No evidence of ischemia or MI.  Fixed defect involving apical portion of the inferior wall represents attenuation.    Recent Labs: 06/17/2020: Hemoglobin 14.9; Platelets 170; TSH 0.970 07/08/2020: Magnesium 2.2 07/20/2020: ALT 30; BUN 19; Creatinine, Ser 1.06; Potassium 5.0; Sodium 141  Recent Lipid Panel    Component Value Date/Time   CHOL 190 06/17/2020 1034   TRIG 178 (H) 06/17/2020 1034   HDL 38 (L) 06/17/2020 1034   CHOLHDL 5.0 06/17/2020 1034   CHOLHDL 6.2 04/07/2019 0334   VLDL 16 04/07/2019 0334   LDLCALC 120 (H) 06/17/2020 1034  Physical Exam:    VS:  BP 136/82   Pulse 78   Ht 5\' 4"  (1.626 m)   Wt 298 lb (135.2 kg)   SpO2 94%   BMI 51.15 kg/m     Wt Readings from Last 3 Encounters:  08/05/20 298 lb (135.2 kg)  07/20/20 295 lb 3.2 oz (133.9 kg)  07/08/20 289 lb 6.4 oz (131.3 kg)     GEN: Well nourished, well developed in no acute distress HEENT: Normal NECK: No JVD; No carotid bruits LYMPHATICS: No lymphadenopathy CARDIAC: S1S2 noted,RRR, no murmurs, rubs, gallops RESPIRATORY:  Clear to auscultation without rales, wheezing or rhonchi  ABDOMEN: Soft, non-tender, non-distended, +bowel sounds, no guarding. EXTREMITIES: No edema, No cyanosis, no clubbing MUSCULOSKELETAL:  No deformity  SKIN: Warm and dry NEUROLOGIC:  Alert and oriented x 3, non-focal PSYCHIATRIC:  Normal affect, good insight  ASSESSMENT:    1. History of CVA in adulthood   2. Hypertension, unspecified type   3. Palpitations   4. Bilateral leg edema   5. Chronic diastolic heart  failure (HCC)    PLAN:    Is recently status post a hospitalization at [facility] for concern for TIA.  He tells me he is following neurology and he is being worked up for possible seizures as well.  In the meantime he has had some fluttering what I like to do is place a monitor on the patient to make sure that he is not experiencing any arrhythmias.  He has a bilateral leg edema I am going to start the patient back on his Lasix 40 mg daily instead of as needed.  Which will put him back on his potassium 20 mEq daily.  Blood work will be done today for BMP and mag.  Is also the patient about his recent stress test which did not show any ischemia.  We will going to monitor the patient if need for any further ischemic evaluation.  The patient understands the need to lose weight with diet and exercise. We have discussed specific strategies for this.  The patient is in agreement with the above plan. The patient left the office in stable condition.  The patient will follow up in 3 months or sooner if needed.   Medication Adjustments/Labs and Tests Ordered: Current medicines are reviewed at length with the patient today.  Concerns regarding medicines are outlined above.  Orders Placed This Encounter  Procedures  . Magnesium  . Basic metabolic panel  . LONG TERM MONITOR (3-14 DAYS)   Meds ordered this encounter  Medications  . potassium chloride SA (KLOR-CON) 20 MEQ tablet    Sig: Take 1 tablet (20 mEq total) by mouth daily.    Dispense:  90 tablet    Refill:  3    Patient Instructions  Medication Instructions:  Your physician has recommended you make the following change in your medication: START: Potassium 20 meq daily with Lasix *If you need a refill on your cardiac medications before your next appointment, please call your pharmacy*   Lab Work: Your physician recommends that you return for lab work: TODAY: BMET, Mag If you have labs (blood work) drawn today and your  tests are completely normal, you will receive your results only by: Marland Kitchen MyChart Message (if you have MyChart) OR . A paper copy in the mail If you have any lab test that is abnormal or we need to change your treatment, we will call you to review the results.   Testing/Procedures: A  zio monitor was ordered today. It will remain on for 14 days. You will then return monitor and event diary in provided box. It takes 1-2 weeks for report to be downloaded and returned to [place]. We will call you with the results. If monitor falls off or has orange flashing light, please call Zio for further instructions.    Follow-Up: At [facility], you and your health needs are our priority.  As part of our continuing mission to provide you with exceptional heart care, we have created designated Provider Care Teams.  These Care Teams include your primary Cardiologist (physician) and Advanced Practice Providers (APPs -  Physician Assistants and Nurse Practitioners) who all work together to provide you with the care you need, when you need it.  We recommend signing up for the patient portal called "MyChart".  Sign up information is provided on this After Visit Summary.  MyChart is used to connect with patients for Virtual Visits (Telemedicine).  Patients are able to view lab/test results, encounter notes, upcoming appointments, etc.  Non-urgent messages can be sent to your provider as well.   To learn more about what you can do with MyChart, go to [email or web address].    Your next appointment:   3 month(s)  The format for your next appointment:   In Person  Provider:   Thomasene Ripple, DO   Other Instructions ZIO  WHY IS MY DOCTOR PRESCRIBING ZIO? The Zio system is proven and trusted by physicians to detect and diagnose irregular heart rhythms -- and has been prescribed to hundreds of thousands of patients.  The FDA has cleared the Zio system to monitor for many different kinds of irregular heart rhythms. In a  study, physicians were able to reach a diagnosis 90% of the time with the Zio system1.  You can wear the Zio monitor -- a small, discreet, comfortable patch -- during your normal day-to-day activity, including while you sleep, shower, and exercise, while it records every single heartbeat for analysis.  1Barrett, P., et al. Comparison of 24 Hour Holter Monitoring Versus 14 Day Novel Adhesive Patch Electrocardiographic Monitoring. American Journal of Medicine, 2014.  ZIO VS. HOLTER MONITORING The Zio monitor can be comfortably worn for up to 14 days. Holter monitors can be worn for 24 to 48 hours, limiting the time to record any irregular heart rhythms you may have. Zio is able to capture data for the 51% of patients who have their first symptom-triggered arrhythmia after 48 hours.1  LIVE WITHOUT RESTRICTIONS The Zio ambulatory cardiac monitor is a small, unobtrusive, and water-resistant patch--you might even forget you're wearing it. The Zio monitor records and stores every beat of your heart, whether you're sleeping, working out, or showering. Remove on: May 26th 2022     Adopting a Healthy Lifestyle.  Know what a healthy weight is for you (roughly BMI <25) and aim to maintain this   Aim for 7+ servings of fruits and vegetables daily   65-80+ fluid ounces of water or unsweet tea for healthy kidneys   Limit to max 1 drink of alcohol per day; avoid smoking/tobacco   Limit animal fats in diet for cholesterol and heart health - choose grass fed whenever available   Avoid highly processed foods, and foods high in saturated/trans fats   Aim for low stress - take time to unwind and care for your mental health   Aim for 150 min of moderate intensity exercise weekly for heart health, and weights twice weekly for bone health  Aim for 7-9 hours of sleep daily   When it comes to diets, agreement about the perfect plan isnt easy to find, even among the experts. Experts at the [employer]  [employer] developed an idea known as the Healthy Eating Plate. Just imagine a plate divided into logical, healthy portions.   The emphasis is on diet quality:   Load up on vegetables and fruits - one-half of your plate: Aim for color and variety, and remember that potatoes dont count.   Go for whole grains - one-quarter of your plate: Whole wheat, barley, wheat berries, quinoa, oats, brown rice, and foods made with them. If you want pasta, go with whole wheat pasta.   Protein power - one-quarter of your plate: Fish, chicken, beans, and nuts are all healthy, versatile protein sources. Limit red meat.   The diet, however, does go beyond the plate, offering a few other suggestions.   Use healthy plant oils, such as olive, canola, soy, corn, sunflower and peanut. Check the labels, and avoid partially hydrogenated oil, which have unhealthy trans fats.   If youre thirsty, drink water. Coffee and tea are good in moderation, but skip sugary drinks and limit milk and dairy products to one or two daily servings.   The type of carbohydrate in the diet is more important than the amount. Some sources of carbohydrates, such as vegetables, fruits, whole grains, and beans-are healthier than others.   Finally, stay active  Signed, Thomasene RippleKardie Midge Momon, DO  08/05/2020 12:09 PM    [facility]

## 2020-08-05 NOTE — Patient Instructions (Signed)
Medication Instructions:  Your physician has recommended you make the following change in your medication: START: Potassium 20 meq daily with Lasix *If you need a refill on your cardiac medications before your next appointment, please call your pharmacy*   Lab Work: Your physician recommends that you return for lab work: TODAY: BMET, Mag If you have labs (blood work) drawn today and your tests are completely normal, you will receive your results only by: Marland Kitchen MyChart Message (if you have MyChart) OR . A paper copy in the mail If you have any lab test that is abnormal or we need to change your treatment, we will call you to review the results.   Testing/Procedures: A zio monitor was ordered today. It will remain on for 14 days. You will then return monitor and event diary in provided box. It takes 1-2 weeks for report to be downloaded and returned to [place]. We will call you with the results. If monitor falls off or has orange flashing light, please call Zio for further instructions.    Follow-Up: At [facility], you and your health needs are our priority.  As part of our continuing mission to provide you with exceptional heart care, we have created designated Provider Care Teams.  These Care Teams include your primary Cardiologist (physician) and Advanced Practice Providers (APPs -  Physician Assistants and Nurse Practitioners) who all work together to provide you with the care you need, when you need it.  We recommend signing up for the patient portal called "MyChart".  Sign up information is provided on this After Visit Summary.  MyChart is used to connect with patients for Virtual Visits (Telemedicine).  Patients are able to view lab/test results, encounter notes, upcoming appointments, etc.  Non-urgent messages can be sent to your provider as well.   To learn more about what you can do with MyChart, go to [email or web address].    Your next appointment:   3 month(s)  The format for your  next appointment:   In Person  Provider:   Thomasene Ripple, DO   Other Instructions ZIO  WHY IS MY DOCTOR PRESCRIBING ZIO? The Zio system is proven and trusted by physicians to detect and diagnose irregular heart rhythms -- and has been prescribed to hundreds of thousands of patients.  The FDA has cleared the Zio system to monitor for many different kinds of irregular heart rhythms. In a study, physicians were able to reach a diagnosis 90% of the time with the Zio system1.  You can wear the Zio monitor -- a small, discreet, comfortable patch -- during your normal day-to-day activity, including while you sleep, shower, and exercise, while it records every single heartbeat for analysis.  1Barrett, P., et al. Comparison of 24 Hour Holter Monitoring Versus 14 Day Novel Adhesive Patch Electrocardiographic Monitoring. American Journal of Medicine, 2014.  ZIO VS. HOLTER MONITORING The Zio monitor can be comfortably worn for up to 14 days. Holter monitors can be worn for 24 to 48 hours, limiting the time to record any irregular heart rhythms you may have. Zio is able to capture data for the 51% of patients who have their first symptom-triggered arrhythmia after 48 hours.1  LIVE WITHOUT RESTRICTIONS The Zio ambulatory cardiac monitor is a small, unobtrusive, and water-resistant patch--you might even forget you're wearing it. The Zio monitor records and stores every beat of your heart, whether you're sleeping, working out, or showering. Remove on: May 26th 2022

## 2020-08-06 LAB — BASIC METABOLIC PANEL
BUN/Creatinine Ratio: 15 (ref 9–20)
BUN: 15 mg/dL (ref 6–24)
CO2: 26 mmol/L (ref 20–29)
Calcium: 9 mg/dL (ref 8.7–10.2)
Chloride: 100 mmol/L (ref 96–106)
Creatinine, Ser: 0.99 mg/dL (ref 0.76–1.27)
Glucose: 91 mg/dL (ref 65–99)
Potassium: 4.6 mmol/L (ref 3.5–5.2)
Sodium: 140 mmol/L (ref 134–144)
eGFR: 88 mL/min/{1.73_m2} (ref >59)

## 2020-08-06 LAB — MAGNESIUM: Magnesium: 2 mg/dL (ref 1.6–2.3)

## 2020-08-16 ENCOUNTER — Other Ambulatory Visit: Payer: Self-pay

## 2020-08-16 DIAGNOSIS — G4739 Other sleep apnea: Secondary | ICD-10-CM

## 2020-08-20 ENCOUNTER — Telehealth: Payer: Self-pay

## 2020-08-20 NOTE — Telephone Encounter (Signed)
Spoke with patient and patient's wife about his monitor results. They verbalized understanding. No further questions or concerns at this time.

## 2020-08-24 ENCOUNTER — Other Ambulatory Visit: Payer: Self-pay

## 2020-08-25 ENCOUNTER — Ambulatory Visit (INDEPENDENT_AMBULATORY_CARE_PROVIDER_SITE_OTHER): Payer: Commercial Managed Care - PPO | Admitting: Cardiology

## 2020-08-25 ENCOUNTER — Encounter: Payer: Self-pay | Admitting: Cardiology

## 2020-08-25 ENCOUNTER — Other Ambulatory Visit: Payer: Self-pay

## 2020-08-25 VITALS — BP 150/98 | HR 84 | Ht 64.0 in | Wt 303.2 lb

## 2020-08-25 DIAGNOSIS — I10 Essential (primary) hypertension: Secondary | ICD-10-CM

## 2020-08-25 DIAGNOSIS — I2510 Atherosclerotic heart disease of native coronary artery without angina pectoris: Secondary | ICD-10-CM

## 2020-08-25 DIAGNOSIS — I5032 Chronic diastolic (congestive) heart failure: Secondary | ICD-10-CM | POA: Diagnosis not present

## 2020-08-25 DIAGNOSIS — E782 Mixed hyperlipidemia: Secondary | ICD-10-CM

## 2020-08-25 DIAGNOSIS — G459 Transient cerebral ischemic attack, unspecified: Secondary | ICD-10-CM

## 2020-08-25 LAB — BASIC METABOLIC PANEL
BUN/Creatinine Ratio: 18 (ref 9–20)
BUN: 18 mg/dL (ref 6–24)
CO2: 22 mmol/L (ref 20–29)
Calcium: 8.7 mg/dL (ref 8.7–10.2)
Chloride: 103 mmol/L (ref 96–106)
Creatinine, Ser: 1.01 mg/dL (ref 0.76–1.27)
Glucose: 107 mg/dL — ABNORMAL HIGH (ref 65–99)
Potassium: 4 mmol/L (ref 3.5–5.2)
Sodium: 140 mmol/L (ref 134–144)
eGFR: 86 mL/min/{1.73_m2} (ref >59)

## 2020-08-25 LAB — MAGNESIUM: Magnesium: 2.1 mg/dL (ref 1.6–2.3)

## 2020-08-25 MED ORDER — BUMETANIDE 1 MG PO TABS
ORAL_TABLET | ORAL | 3 refills | Status: DC
Start: 2020-08-25 — End: 2020-09-24

## 2020-08-25 NOTE — Progress Notes (Addendum)
Cardiology Office Note:    Date:  08/25/2020   ID:  Randall Rojas, DOB 06-14-1960, MRN [id number]  PCP:  Marianne Sofia, PA-C  Cardiologist:  Thomasene Ripple, DO  Electrophysiologist:  None   Referring MD: Marianne Sofia, PA-C   " I am still not doing well"  History of Present Illness:    Randall Rojas is a 60 y.o. male with a hx of coronary artery disease with recent nuclear stress test showing no evidence of ischemia, recent TIA the patient is on dual antiplatelet therapy, hypertension, hyperlipidemia, chronic diastolic heart failure with his recent EF in May 20 20-50 5 to 60%.  I last saw the patient on Aug 05, 2020, and prior to that visit the patient reported that he had been admitted twice at the [facility] hospital first for concern for stroke.  Both visits there was concern for TIA.  The second visit which was on July 11, 2020 the patient has significant headache along with left-sided weakness and numbness.  He was then admitted to the [facility] hospital.  During that time he underwent multiple testing modalities his MRI demonstrated no acute cranial abnormalities, his CTA showed 50% stenosis of the right ICA and less than 50% stenosis of the left ICA.  The patient did see neurology while he was in the hospital he was started on dual antiplatelet therapy for 3 months and then will transition to aspirin only.  He is now following with neurology.  He was started on statins as well.  At the conclusion of his Aug 05, 2020 visit he reported that he was experiencing some fluttering at therefore place a monitor on the patient.  At that time he also appeared to be fluid overloaded we restarted his Lasix.  Since I last saw the patient he had been admitted at [facility] hospital with a headache concerns that the patient may be experiencing multiple episodes of seizure.  He is being worked up by neurology there.  He is planning on undergoing a inpatient continuous EEG monitoring  with [facility].  He tells me that from a cardiovascular standpoint has not had any chest pain, or palpitations.  He has had some shortness of breath and leg swelling.  Past Medical History:  Diagnosis Date  . Asthma   . Chest pain 04/15/2015  . Coronary artery calcification seen on CT scan 04/15/2015  . Coronary artery disease   . Essential hypertension 04/15/2015  . Hyperlipidemia 04/15/2015  . Morbid obesity (HCC) 04/15/2015  . TIA (transient ischemic attack)     Past Surgical History:  Procedure Laterality Date  . CARDIAC CATHETERIZATION N/A 04/22/2015   Procedure: Left Heart Cath and Coronary Angiography;  Surgeon: Peter M Swaziland, MD;  Location: [facility] INVASIVE CV LAB;  Service: Cardiovascular;  Laterality: N/A;  . GALLBLADDER SURGERY    . SINUS SURGERY WITH INSTATRAK      Current Medications: Current Meds  Medication Sig  . albuterol (VENTOLIN HFA) 108 (90 Base) MCG/ACT inhaler Inhale 2 puffs into the lungs every 6 (six) hours as needed for wheezing or shortness of breath.  . ASPIRIN 81 PO Take 1 tablet by mouth daily.  Marland Kitchen atorvastatin (LIPITOR) 80 MG tablet Take 1 tablet by mouth daily.  . baclofen (LIORESAL) 10 MG tablet Take 1-2 tablets by mouth every 6 (six) hours as needed for hallucinations.  . bumetanide (BUMEX) 1 MG tablet 1 mg twice daily for 5 days then 1 mg once daily  .  busPIRone (BUSPAR) 5 MG tablet Take 5 mg by mouth 3 (three) times daily.  . clopidogrel (PLAVIX) 75 MG tablet Take 1 tablet by mouth daily.  . cyanocobalamin 1000 MCG tablet Take 1 tablet by mouth daily.  Marland Kitchen levETIRAcetam (KEPPRA) 750 MG tablet Take 1 tablet by mouth in the morning and at bedtime.  . nitroGLYCERIN (NITROSTAT) 0.4 MG SL tablet Place 1 tablet (0.4 mg total) under the tongue every 5 (five) minutes as needed for chest pain.  . potassium chloride SA (KLOR-CON) 20 MEQ tablet Take 1 tablet (20 mEq total) by mouth daily.  . [DISCONTINUED] furosemide (LASIX) 40 MG tablet Take 1 tablet (40  mg total) by mouth daily.     Allergies:   Cephalexin   Social History   Socioeconomic History  . Marital status: Married    Spouse name: Not on file  . Number of children: Not on file  . Years of education: Not on file  . Highest education level: Not on file  Occupational History  . 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 Smoking status: Former Smoker    Quit date: 04/13/1989    Years since quitting: 31.3  . Smokeless tobacco: Never Used  Substance and Sexual Activity  . Alcohol use: Not on file  . Drug use: Not on file  . Sexual activity: Not on file  Other Topics Concern  . Not on file  Social History Narrative   Epworth Sleepiness Scale = 7 (as of 1/18/207)   Social Determinants of Health   Financial Resource Strain: Not on file  Food Insecurity: Not on file  Transportation Needs: Not on file  Physical Activity: Not on file  Stress: Not on file  Social Connections: Not on file     Family History: The patient's family history includes Cancer in his brother and father; Heart attack in his father; Hypertension in his mother and sister; Rheum arthritis in his mother.  ROS:   Review of Systems  Constitution: Negative for decreased appetite, fever and weight gain.  HENT: Negative for congestion, ear discharge, hoarse voice and sore throat.   Eyes: Negative for discharge, redness, vision loss in right eye and visual halos.  Cardiovascular: Negative for chest pain, dyspnea on exertion, leg swelling, orthopnea and palpitations.  Respiratory: Negative for cough, hemoptysis, shortness of breath and snoring.   Endocrine: Negative for heat intolerance and polyphagia.  Hematologic/Lymphatic: Negative for bleeding problem. Does not bruise/bleed easily.  Skin: Negative for flushing, nail changes, rash and suspicious lesions.  Musculoskeletal: Negative for arthritis, joint pain, muscle cramps, myalgias, neck pain and stiffness.  Gastrointestinal: Negative for abdominal  pain, bowel incontinence, diarrhea and excessive appetite.  Genitourinary: Negative for decreased libido, genital sores and incomplete emptying.  Neurological: Negative for brief paralysis, focal weakness, headaches and loss of balance.  Psychiatric/Behavioral: Negative for altered mental status, depression and suicidal ideas.  Allergic/Immunologic: Negative for HIV exposure and persistent infections.    EKGs/Labs/Other Studies Reviewed:    The following studies were reviewed today:   EKG:  The ekg ordered today demonstrates   ZIO monitor Patch Wear Time:  3 days and 10 hours starting Aug 05, 2020 Indication: Palpitations  Patient had a min HR of 51 bpm, max HR of 120 bpm, and avg HR of 83 bpm.   Predominant underlying rhythm was Sinus Rhythm.  Premature ventricular complexes were rare.   Premature atrial complex were rare.  No ventricular tachycardia, no pauses, no supraventricular tachycardia no atrial fibrillation  noted.  Symptoms associated with sinus rhythm.  Conclusion: Normal/unremarkable study with no evidence of significant arrhythmia   Myocardial perfusion study July 07, 2020  Nuclear stress EF: 59%.  The left ventricular ejection fraction is normal (55-65%).  There was no ST segment deviation noted during stress.  Defect 1: There is a small defect of mild severity present in the apical inferior location.  This is a low risk study.  No evidence of ischemia or MI.  Fixed defect involving apical portion of the inferior wall represents attenuation.  Echocardiogram done on Jul 26, 2020 at [facility] SUMMARY  Injection of agitated saline contrast performed to evaluate for  possible shunt  Image Quality: Poor.  Left ventricular systolic function is normal.  LV ejection fraction = 55-60%.  There is no pericardial effusion.  Injection of agitated saline did not show a right-to-left shunt on  limited study.   Probably no significant  change in comparison with the prior study  -  FINDINGS:  LEFT VENTRICLE  The left ventricular size is normal. Mild left ventricular  hypertrophy. LV ejection fraction = 55-60%. Left ventricular systolic  function is normal. Unable to fully assess LV regional wall motion.      Recent Labs: 06/17/2020: Hemoglobin 14.9; Platelets 170; TSH 0.970 07/20/2020: ALT 30 08/05/2020: BUN 15; Creatinine, Ser 0.99; Magnesium 2.0; Potassium 4.6; Sodium 140  Recent Lipid Panel    Component Value Date/Time   CHOL 190 06/17/2020 1034   TRIG 178 (H) 06/17/2020 1034   HDL 38 (L) 06/17/2020 1034   CHOLHDL 5.0 06/17/2020 1034   CHOLHDL 6.2 04/07/2019 0334   VLDL 16 04/07/2019 0334   LDLCALC 120 (H) 06/17/2020 1034    Physical Exam:    VS:  BP (!) 150/98   Pulse 84   Ht 5\' 4"  (1.626 m)   Wt (!) 303 lb 3.2 oz (137.5 kg)   SpO2 94%   BMI 52.04 kg/m     Wt Readings from Last 3 Encounters:  08/25/20 (!) 303 lb 3.2 oz (137.5 kg)  08/05/20 298 lb (135.2 kg)  07/20/20 295 lb 3.2 oz (133.9 kg)     GEN: Well nourished, well developed in no acute distress HEENT: Normal NECK: No JVD; No carotid bruits LYMPHATICS: No lymphadenopathy CARDIAC: S1S2 noted,RRR, no murmurs, rubs, gallops RESPIRATORY:  Clear to auscultation without rales, wheezing or rhonchi  ABDOMEN: Soft, non-tender, non-distended, +bowel sounds, no guarding. EXTREMITIES: +2 bilateral edema, No cyanosis, no clubbing MUSCULOSKELETAL:  No deformity  SKIN: Warm and dry NEUROLOGIC:  Alert and oriented x 3, non-focal PSYCHIATRIC:  Normal affect, good insight  ASSESSMENT:    1. Hypertension, unspecified type   2. Coronary artery disease involving native coronary artery of native heart without angina pectoris   3. TIA (transient ischemic attack)   4. Essential hypertension   5. Chronic diastolic heart failure (HCC)   6. Mixed hyperlipidemia   7. Morbid obesity (HCC)    PLAN:    Clinically he is appear to be fluid overloaded.  He is  currently on Lasix 40 mg twice a day as I am suspecting that he may be resistant to this diuretic.  I am going to stop the Lasix and start the patient on Bumex 1 mg twice a day for 5 days and then 1 mg daily.  The patient will take his blood pressure and weight daily and send me this information after 7 days.  If needed we will adjust his diuretics.  Blood  work to be done today for [employer] and mag.  No anginal symptoms, recent stress test low risk with no evidence of ischemia.  He is following with neurology as he is concerned that he has seizures and he has been treated with Keppra.  The patient and his wife are both contemplating having the patient apply for long-term disability.  The patient is in agreement with the above plan. The patient left the office in stable condition.  The patient will follow up in 4 weeks due to medication change.   Medication Adjustments/Labs and Tests Ordered: Current medicines are reviewed at length with the patient today.  Concerns regarding medicines are outlined above.  Orders Placed This Encounter  Procedures  . Basic metabolic panel  . Magnesium   Meds ordered this encounter  Medications  . bumetanide (BUMEX) 1 MG tablet    Sig: 1 mg twice daily for 5 days then 1 mg once daily    Dispense:  90 tablet    Refill:  3    Patient Instructions  Medication Instructions:  Your physician has recommended you make the following change in your medication: STOP: Lasix START: Bumex 1 mg twice daily for 5 days then 1 mg once daily TAKE: Potassium 20 meq twice daily for 5 days then 20 meq once daily *If you need a refill on your cardiac medications before your next appointment, please call your pharmacy*   Lab Work: Your physician recommends that you return for lab work: TODAY: BMET, Mag If you have labs (blood work) drawn today and your tests are completely normal, you will receive your results only by: Marland Kitchen MyChart Message (if you have MyChart) OR . A paper  copy in the mail If you have any lab test that is abnormal or we need to change your treatment, we will call you to review the results.   Testing/Procedures: None   Follow-Up: At [facility], you and your health needs are our priority.  As part of our continuing mission to provide you with exceptional heart care, we have created designated Provider Care Teams.  These Care Teams include your primary Cardiologist (physician) and Advanced Practice Providers (APPs -  Physician Assistants and Nurse Practitioners) who all work together to provide you with the care you need, when you need it.  We recommend signing up for the patient portal called "MyChart".  Sign up information is provided on this After Visit Summary.  MyChart is used to connect with patients for Virtual Visits (Telemedicine).  Patients are able to view lab/test results, encounter notes, upcoming appointments, etc.  Non-urgent messages can be sent to your provider as well.   To learn more about what you can do with MyChart, go to [email or web address].    Your next appointment:   4 week(s)  The format for your next appointment:   In Person  Provider:   Thomasene Ripple, DO   Other Instructions      Adopting a Healthy Lifestyle.  Know what a healthy weight is for you (roughly BMI <25) and aim to maintain this   Aim for 7+ servings of fruits and vegetables daily   65-80+ fluid ounces of water or unsweet tea for healthy kidneys   Limit to max 1 drink of alcohol per day; avoid smoking/tobacco   Limit animal fats in diet for cholesterol and heart health - choose grass fed whenever available   Avoid highly processed foods, and foods high in saturated/trans fats   Aim for low stress -  take time to unwind and care for your mental health   Aim for 150 min of moderate intensity exercise weekly for heart health, and weights twice weekly for bone health   Aim for 7-9 hours of sleep daily   When it comes to diets,  agreement about the perfect plan isnt easy to find, even among the experts. Experts at the [facility] [employer] developed an idea known as the Healthy Eating Plate. Just imagine a plate divided into logical, healthy portions.   The emphasis is on diet quality:   Load up on vegetables and fruits - one-half of your plate: Aim for color and variety, and remember that potatoes dont count.   Go for whole grains - one-quarter of your plate: Whole wheat, barley, wheat berries, quinoa, oats, brown rice, and foods made with them. If you want pasta, go with whole wheat pasta.   Protein power - one-quarter of your plate: Fish, chicken, beans, and nuts are all healthy, versatile protein sources. Limit red meat.   The diet, however, does go beyond the plate, offering a few other suggestions.   Use healthy plant oils, such as olive, canola, soy, corn, sunflower and peanut. Check the labels, and avoid partially hydrogenated oil, which have unhealthy trans fats.   If youre thirsty, drink water. Coffee and tea are good in moderation, but skip sugary drinks and limit milk and dairy products to one or two daily servings.   The type of carbohydrate in the diet is more important than the amount. Some sources of carbohydrates, such as vegetables, fruits, whole grains, and beans-are healthier than others.   Finally, stay active  Signed, Thomasene Ripple, DO  08/25/2020 9:58 AM    [facility]

## 2020-08-25 NOTE — Patient Instructions (Signed)
Medication Instructions:  Your physician has recommended you make the following change in your medication: STOP: Lasix START: Bumex 1 mg twice daily for 5 days then 1 mg once daily TAKE: Potassium 20 meq twice daily for 5 days then 20 meq once daily *If you need a refill on your cardiac medications before your next appointment, please call your pharmacy*   Lab Work: Your physician recommends that you return for lab work: TODAY: BMET, Mag If you have labs (blood work) drawn today and your tests are completely normal, you will receive your results only by: Marland Kitchen MyChart Message (if you have MyChart) OR . A paper copy in the mail If you have any lab test that is abnormal or we need to change your treatment, we will call you to review the results.   Testing/Procedures: None   Follow-Up: At [facility], you and your health needs are our priority.  As part of our continuing mission to provide you with exceptional heart care, we have created designated Provider Care Teams.  These Care Teams include your primary Cardiologist (physician) and Advanced Practice Providers (APPs -  Physician Assistants and Nurse Practitioners) who all work together to provide you with the care you need, when you need it.  We recommend signing up for the patient portal called "MyChart".  Sign up information is provided on this After Visit Summary.  MyChart is used to connect with patients for Virtual Visits (Telemedicine).  Patients are able to view lab/test results, encounter notes, upcoming appointments, etc.  Non-urgent messages can be sent to your provider as well.   To learn more about what you can do with MyChart, go to [email or web address].    Your next appointment:   4 week(s)  The format for your next appointment:   In Person  Provider:   Thomasene Ripple, DO   Other Instructions

## 2020-08-26 ENCOUNTER — Ambulatory Visit (INDEPENDENT_AMBULATORY_CARE_PROVIDER_SITE_OTHER): Payer: Commercial Managed Care - PPO | Admitting: Physician Assistant

## 2020-08-26 ENCOUNTER — Telehealth: Payer: Self-pay | Admitting: Cardiology

## 2020-08-26 ENCOUNTER — Telehealth: Payer: Self-pay

## 2020-08-26 ENCOUNTER — Encounter: Payer: Self-pay | Admitting: Physician Assistant

## 2020-08-26 VITALS — BP 126/84 | HR 81 | Temp 96.8°F | Ht 64.0 in | Wt 303.8 lb

## 2020-08-26 DIAGNOSIS — I10 Essential (primary) hypertension: Secondary | ICD-10-CM | POA: Diagnosis not present

## 2020-08-26 DIAGNOSIS — G459 Transient cerebral ischemic attack, unspecified: Secondary | ICD-10-CM | POA: Diagnosis not present

## 2020-08-26 DIAGNOSIS — R0789 Other chest pain: Secondary | ICD-10-CM | POA: Diagnosis not present

## 2020-08-26 LAB — TROPONIN T: Troponin T (Highly Sensitive): 10 ng/L (ref 0–22)

## 2020-08-26 NOTE — Telephone Encounter (Signed)
Forms dropped off by Fayrene Fearing and Humberto Leep for short term disability, need a paper copy to pick up and also faxed to [employer] [phone]. received on 08/26/2020. Completed patient auth attached. Fee paid by paper check. Sent to MD box for completion.  kbl 08/26/2020

## 2020-08-26 NOTE — Progress Notes (Signed)
Subjective:  Patient ID: Randall Rojas, male    DOB: 07/10/1960  Age: 60 y.o. MRN: [id number]  Chief Complaint  Patient presents with  . Chest pain    HPI  Pt here today for follow up but states that since last night he has had chest pain - describes as a minimal pressure that is constant but occasionally has sharper pains - he has nitroglycerin but has not taken the medication States nothing makes discomfort worse or better -- says here in office he is not having the pain He just saw cardiologist Dr Servando Salina yesterday for followup (and at that time had not been having chest pains) He has had a normal event monitor, normal nuclear stress on 4/22 and on 5/2 an echocardiogram He had a normal BMP and magnesium level done yesterday He was just switched off of lasix and changed to bumex but first pill was this morning  Pt is currently on Plavix 75mg  qd and ASA 81mg  qd for history of TIAs - he is following with neurology and is scheduled for EEG testing in the next month He states he has not had recent numbness or weakness that he had been having in the past few months Current Outpatient Medications on File Prior to Visit  Medication Sig Dispense Refill  . albuterol (VENTOLIN HFA) 108 (90 Base) MCG/ACT inhaler Inhale 2 puffs into the lungs every 6 (six) hours as needed for wheezing or shortness of breath. 8 g 0  . ASPIRIN 81 PO Take 1 tablet by mouth daily.    atorvastatin (LIPITOR) 80 MG tablet Take 1 tablet by mouth daily.    . baclofen (LIORESAL) 10 MG tablet Take 1-2 tablets by mouth every 6 (six) hours as needed for hallucinations.    . bumetanide (BUMEX) 1 MG tablet 1 mg twice daily for 5 days then 1 mg once daily 90 tablet 3  . busPIRone (BUSPAR) 5 MG tablet Take 5 mg by mouth 3 (three) times daily.    . clopidogrel (PLAVIX) 75 MG tablet Take 1 tablet by mouth daily.    . cyanocobalamin 1000 MCG tablet Take 1 tablet by mouth daily.    levETIRAcetam (KEPPRA) 750 MG tablet Take 1 tablet  by mouth in the morning and at bedtime.    . nitroGLYCERIN (NITROSTAT) 0.4 MG SL tablet Place 1 tablet (0.4 mg total) under the tongue every 5 (five) minutes as needed for chest pain. 90 tablet 3  . potassium chloride SA (KLOR-CON) 20 MEQ tablet Take 1 tablet (20 mEq total) by mouth daily. 90 tablet 3   No current facility-administered medications on file prior to visit.   Past Medical History:  Diagnosis Date  . Asthma   . Chest pain 04/15/2015  . Coronary artery calcification seen on CT scan 04/15/2015  . Coronary artery disease   . Essential hypertension 04/15/2015  . Hyperlipidemia 04/15/2015  . Morbid obesity (HCC) 04/15/2015  . TIA (transient ischemic attack)    Past Surgical History:  Procedure Laterality Date  . CARDIAC CATHETERIZATION N/A 04/22/2015   Procedure: Left Heart Cath and Coronary Angiography;  Surgeon: Peter M 04/17/2015, MD;  Location: [facility] INVASIVE CV LAB;  Service: Cardiovascular;  Laterality: N/A;  . GALLBLADDER SURGERY    . SINUS SURGERY WITH INSTATRAK      Family History  Problem Relation Age of Onset  . Hypertension Mother   . Rheum arthritis Mother   . Cancer Father   . Heart attack Father   .  Cancer Brother   . Hypertension Sister    Social History   Socioeconomic History  . Marital status: Married    Spouse name: Not on file  . Number of children: Not on file  . Years of education: Not on file  . Highest education level: Not on file  Occupational History  . 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 Smoking status: Former Smoker    Quit date: 04/13/1989    Years since quitting: 31.3  . Smokeless tobacco: Never Used  Substance and Sexual Activity  . Alcohol use: Not on file  . Drug use: Not on file  . Sexual activity: Not on file  Other Topics Concern  . Not on file  Social History Narrative   Epworth Sleepiness Scale = 7 (as of 1/18/207)   Social Determinants of Health   Financial Resource Strain: Not on file  Food Insecurity: Not on file   Transportation Needs: Not on file  Physical Activity: Not on file  Stress: Not on file  Social Connections: Not on file    Review of Systems CONSTITUTIONAL: Negative for chills, fatigue, fever, unintentional weight gain and unintentional weight loss.  E/N/T: Negative for ear pain, nasal congestion and sore throat.  CARDIOVASCULAR: see HPI RESPIRATORY: Negative for recent cough and dyspnea.  GASTROINTESTINAL: Negative for abdominal pain, acid reflux symptoms, constipation, diarrhea, nausea and vomiting.  MSK: Negative for arthralgias and myalgias.  INTEGUMENTARY: Negative for rash.  NEUROLOGICAL: see HPI PSYCHIATRIC: Negative for sleep disturbance and to question depression screen.  Negative for depression, negative for anhedonia.       Objective:  BP 126/84 (BP Location: Left Arm, Patient Position: Sitting, Cuff Size: Normal)   Pulse 81   Temp (!) 96.8 F (36 C) (Temporal)   Ht 5\' 4"  (1.626 m)   Wt (!) 303 lb 12.8 oz (137.8 kg)   SpO2 92%   BMI 52.15 kg/m   BP/Weight 08/26/2020 08/25/2020 08/05/2020  Systolic BP 126 150 136  Diastolic BP 84 98 82  Wt. (Lbs) 303.8 303.2 298  BMI 52.15 52.04 51.15    Physical Exam PHYSICAL EXAM:   VS: BP 126/84 (BP Location: Left Arm, Patient Position: Sitting, Cuff Size: Normal)   Pulse 81   Temp (!) 96.8 F (36 C) (Temporal)   Ht 5\' 4"  (1.626 m)   Wt (!) 303 lb 12.8 oz (137.8 kg)   SpO2 92%   BMI 52.15 kg/m   GEN: Well nourished, well developed, in no acute distress  Cardiac: RRR; no murmurs, rubs, or gallops,no edema -  Respiratory:  normal respiratory rate and pattern with no distress - normal breath sounds with no rales, rhonchi, wheezes or rubs MS: no deformity or atrophy  Skin: warm and dry, no rash  Psych: euthymic mood, appropriate affect and demeanor  EKG normal Diabetic Foot Exam - Simple   No data filed      Lab Results  Component Value Date   WBC 7.7 06/17/2020   HGB 14.9 06/17/2020   HCT 44.5 06/17/2020    PLT 170 06/17/2020   GLUCOSE 107 (H) 08/25/2020   CHOL 190 06/17/2020   TRIG 178 (H) 06/17/2020   HDL 38 (L) 06/17/2020   LDLCALC 120 (H) 06/17/2020   ALT 30 07/20/2020   AST 17 07/20/2020   NA 140 08/25/2020   K 4.0 08/25/2020   CL 103 08/25/2020   CREATININE 1.01 08/25/2020   BUN 18 08/25/2020   CO2 22 08/25/2020   TSH  0.970 06/17/2020   INR 0.92 04/14/2015   HGBA1C 6.1 (H) 07/08/2020      Assessment & Plan:   1. Other chest pain - EKG 12-Lead - Troponin T Recommend to use nitrostat as directed Will consult with Dr Servando Salina to notify of patient's symptoms and refer for further evaluation 2. TIA (transient ischemic attack) Continue current meds as directed Follow up with neurology as scheduled 3. Primary hypertension  continue current meds as directed  No orders of the defined types were placed in this encounter.   Orders Placed This Encounter  Procedures  . Troponin T  . EKG 12-Lead     Follow-up: Return in about 4 weeks (around 09/23/2020) for follow up. *consulted with Dr Sedalia Muta and is agreeable to plan of care An After Visit Summary was printed and given to the patient.  Jettie Pagan [facility] [phone]

## 2020-08-26 NOTE — Telephone Encounter (Signed)
Spoke with patient about his lab results and his sleep study appointment at on July 15th at 11am. Patient verbalizes understanding. No further questions or concerns at this time.

## 2020-08-27 ENCOUNTER — Telehealth: Payer: Self-pay | Admitting: Cardiology

## 2020-08-27 NOTE — Telephone Encounter (Signed)
Wife of patient called. Patient went to see his PCP yesterday (08/26/20) and had his troponin checked. The wife states the troponin went up to a 10. The patient went to his PCP and had some chest pain that started at 1 am yesterday morning and continued until he saw his PCP. The wife states the patient's chest pain resolved after seeing the PCP.  Per the wife the last two readings were:  08/05/20 (In ER) 3 08/26/20 (At PCP) 10  The patient is not having any chest pain at the time of the call to Dr. Servando Salina   Wife thinks Dr. Servando Salina needs to be aware of this information

## 2020-08-30 ENCOUNTER — Telehealth: Payer: Self-pay | Admitting: Cardiology

## 2020-08-30 ENCOUNTER — Telehealth: Payer: Self-pay

## 2020-08-30 MED ORDER — METOLAZONE 5 MG PO TABS: 5.0000 mg | ORAL_TABLET | Freq: Every day | ORAL | 0 refills | Status: DC

## 2020-08-30 NOTE — Telephone Encounter (Signed)
Pt c/o BP issue: STAT if pt c/o blurred vision, one-sided weakness or slurred speech  1. What are your last 5 BP readings?   06/02: 140/83 299 lbs 06/03: 132/84 297 lbs 06/04: 140/89 298 lbs 06/05: 140/82 299 lbs 06/06: 121/82 304 lbs  2. Are you having any other symptoms (ex. Dizziness, headache, blurred vision, passed out)?  No   3. What is your BP issue?  Patient is following up to provide his weight and BP readings over the past 5 days. He states he was told to hold his medication for 5 days and to call back with updated readings.

## 2020-08-30 NOTE — Telephone Encounter (Signed)
Please send metolazone 5 mg x 3 doses 30 minutes before his Bumex dose.

## 2020-08-30 NOTE — Telephone Encounter (Signed)
Spoke with patient about adding Metolazone 5 mg for 3 doses. Patient instructed how to take the medication. Patient verbalizes understanding. No further questions or concerns at this time. Prescription sent to pharmacy.

## 2020-08-30 NOTE — Telephone Encounter (Signed)
Spoke with patient, see chart.    

## 2020-08-31 ENCOUNTER — Telehealth: Payer: Self-pay

## 2020-08-31 NOTE — Telephone Encounter (Signed)
Patient was calling to see if the forms have been filled out and faxed for his disability.

## 2020-08-31 NOTE — Telephone Encounter (Signed)
Patient called stated that we needed to get in touch with heart care about his Short term disability paperwork.   Spoke with Candise Bowens at [facility] and she stated that they do have the Short Term Disability paperwork for patient and are waiting on the doctor to fill out paper work and than they will fax it.

## 2020-09-01 ENCOUNTER — Telehealth: Payer: Self-pay | Admitting: Cardiology

## 2020-09-01 NOTE — Telephone Encounter (Signed)
PT is calling in to check the status of his disability paperwork.PT states his PCP brought the papers in office for it to be filled out.PT is requesting a callback to confirm they were received and filled out so he can get his check.

## 2020-09-02 ENCOUNTER — Other Ambulatory Visit: Payer: Self-pay

## 2020-09-02 ENCOUNTER — Ambulatory Visit (INDEPENDENT_AMBULATORY_CARE_PROVIDER_SITE_OTHER): Payer: Commercial Managed Care - PPO | Admitting: Nurse Practitioner

## 2020-09-02 ENCOUNTER — Encounter: Payer: Self-pay | Admitting: Nurse Practitioner

## 2020-09-02 VITALS — BP 110/76 | HR 78 | Temp 97.4°F | Ht 64.0 in | Wt 301.0 lb

## 2020-09-02 DIAGNOSIS — R600 Localized edema: Secondary | ICD-10-CM

## 2020-09-02 DIAGNOSIS — R0602 Shortness of breath: Secondary | ICD-10-CM | POA: Diagnosis not present

## 2020-09-02 DIAGNOSIS — R229 Localized swelling, mass and lump, unspecified: Secondary | ICD-10-CM | POA: Diagnosis not present

## 2020-09-02 DIAGNOSIS — R0789 Other chest pain: Secondary | ICD-10-CM | POA: Diagnosis not present

## 2020-09-02 LAB — TROPONIN T: Troponin T (Highly Sensitive): 13 ng/L (ref 0–22)

## 2020-09-02 MED ORDER — AMOXICILLIN 875 MG PO TABS: 875.0000 mg | ORAL_TABLET | Freq: Two times a day (BID) | ORAL | 0 refills | Status: AC

## 2020-09-02 NOTE — Progress Notes (Signed)
Acute Office Visit  Subjective:    Patient ID: Randall Rojas, male    DOB: 06/25/1960, 60 y.o.   MRN: [id number]  Chief Complaint  Patient presents with   Tender mass left forearm x 2        HPI [name] is a 60 year old Caucasian male that presents for evaluation of two tender masses to left forearm. He is accompanied by his spouse. [name] tells me that he experienced severe chest pain with dyspnea yesterday throughout the day. States chest pain lasted "80%" of the day yesterday. He denies taking NTG or seeking emergency medical attention.He tells me that he was seen 08/25/20 by Dr Harriet Masson, cardiologist, who prescribed Bumex and Zaroxolyn for bilateral pedal edema. He states he did not take NTG or seek emergency care because he thought the symptoms were side effects of new medications after searching the Internet for information. States chest pain and dyspnea have subsided today.   [name] states he developed painful masses x 2 to left forearm approximately 1-week ago. Denies trauma to left forearm or previous nodules. He states that he noticed a similar masses to bilateral lower ankles today.    Past Medical History:  Diagnosis Date   Asthma    Chest pain 04/15/2015   Coronary artery calcification seen on CT scan 04/15/2015   Coronary artery disease    Essential hypertension 04/15/2015   Hyperlipidemia 04/15/2015   Morbid obesity ([place]) 04/15/2015   TIA (transient ischemic attack)     Past Surgical History:  Procedure Laterality Date   CARDIAC CATHETERIZATION N/A 04/22/2015   Procedure: Left Heart Cath and Coronary Angiography;  Surgeon: Peter M Martinique, MD;  Location: [place] CV LAB;  Service: Cardiovascular;  Laterality: N/A;   GALLBLADDER SURGERY     SINUS SURGERY WITH INSTATRAK      Family History  Problem Relation Age of Onset   Hypertension Mother    Rheum arthritis Mother    Cancer Father    Heart attack Father    Cancer Brother    Hypertension Sister     Social History    Socioeconomic History   Marital status: Married    Spouse name: Not on file   Number of children: Not on file   Years of education: Not on file   Highest education level: Not on file  Occupational History   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  Tobacco Use   Smoking status: Former    Pack years: 0.00    Types: Cigarettes    Quit date: 04/13/1989    Years since quitting: 31.4   Smokeless tobacco: Never  Substance and Sexual Activity   Alcohol use: Not on file   Drug use: Not on file   Sexual activity: Not on file  Other Topics Concern   Not on file  Social History Narrative   Epworth Sleepiness Scale = 7 (as of 1/18/207)   Social Determinants of Health   Financial Resource Strain: Not on file  Food Insecurity: Not on file  Transportation Needs: Not on file  Physical Activity: Not on file  Stress: Not on file  Social Connections: Not on file  Intimate Partner Violence: Not on file    Outpatient Medications Prior to Visit  Medication Sig Dispense Refill   albuterol (VENTOLIN HFA) 108 (90 Base) MCG/ACT inhaler Inhale 2 puffs into the lungs every 6 (six) hours as needed for wheezing or shortness of breath. 8 g 0   ASPIRIN 81 PO Take 1 tablet by mouth  daily.     atorvastatin (LIPITOR) 80 MG tablet Take 1 tablet by mouth daily.     baclofen (LIORESAL) 10 MG tablet Take 1-2 tablets by mouth every 6 (six) hours as needed for hallucinations.     bumetanide (BUMEX) 1 MG tablet 1 mg twice daily for 5 days then 1 mg once daily 90 tablet 3   busPIRone (BUSPAR) 5 MG tablet Take 5 mg by mouth 3 (three) times daily.     clopidogrel (PLAVIX) 75 MG tablet Take 1 tablet by mouth daily.     cyanocobalamin 1000 MCG tablet Take 1 tablet by mouth daily.     levETIRAcetam (KEPPRA) 750 MG tablet Take 1 tablet by mouth in the morning and at bedtime.     metolazone (ZAROXOLYN) 5 MG tablet Take 1 tablet (5 mg total) by mouth daily. 3 tablet 0   nitroGLYCERIN (NITROSTAT) 0.4 MG SL tablet Place 1 tablet  (0.4 mg total) under the tongue every 5 (five) minutes as needed for chest pain. 90 tablet 3   potassium chloride SA (KLOR-CON) 20 MEQ tablet Take 1 tablet (20 mEq total) by mouth daily. 90 tablet 3   No facility-administered medications prior to visit.    Allergies  Allergen Reactions   Cephalexin Nausea And Vomiting and Rash    Review of Systems  Constitutional:  Positive for fatigue.  HENT: Negative.    Respiratory:  Positive for cough and shortness of breath.   Cardiovascular:  Positive for chest pain, palpitations and leg swelling.  Gastrointestinal:  Negative for constipation, diarrhea, nausea and vomiting.  Endocrine: Negative.   Genitourinary: Negative.   Musculoskeletal:  Positive for arthralgias and back pain.  Skin:        Skin mass to left forearm x 2 and bilateral ankles  Allergic/Immunologic: Negative.   Neurological:  Positive for dizziness and headaches.  Hematological:  Negative for adenopathy.  Psychiatric/Behavioral:  Positive for decreased concentration.       Objective:    Physical Exam Vitals reviewed.  Constitutional:      Appearance: Normal appearance. He is obese.  HENT:     Head: Normocephalic.  Cardiovascular:     Rate and Rhythm: Normal rate and regular rhythm.     Pulses: Normal pulses.     Heart sounds: Normal heart sounds.  Pulmonary:     Effort: Pulmonary effort is normal.     Breath sounds: Normal breath sounds.  Abdominal:     General: Bowel sounds are normal.     Palpations: Abdomen is soft. There is no mass.     Tenderness: There is no abdominal tenderness.  Musculoskeletal:        General: Swelling and tenderness present.     Right lower leg: Edema present.     Left lower leg: Edema present.     Comments: Tender palpable mass to left forearm x 2, approximately 3 mm in diameter; bilateral 2+ non-pitting pedal edema  Skin:    General: Skin is warm and dry.     Capillary Refill: Capillary refill takes less than 2 seconds.   Neurological:     General: No focal deficit present.     Mental Status: He is alert and oriented to person, place, and time.  Psychiatric:        Mood and Affect: Mood normal.        Behavior: Behavior normal.    Wt Readings from Last 3 Encounters:  08/26/20 (!) 303 lb 12.8 oz (137.8 kg)  08/25/20 (!) 303 lb 3.2 oz (137.5 kg)  08/05/20 298 lb (135.2 kg)    Health Maintenance Due  Topic Date Due   Pneumococcal Vaccine 62-76 Years old (1 - PCV) Never done   Zoster Vaccines- Shingrix (1 of 2) Never done       Lab Results  Component Value Date   TSH 0.970 06/17/2020   Lab Results  Component Value Date   WBC 7.7 06/17/2020   HGB 14.9 06/17/2020   HCT 44.5 06/17/2020   MCV 91 06/17/2020   PLT 170 06/17/2020   Lab Results  Component Value Date   NA 140 08/25/2020   K 4.0 08/25/2020   CO2 22 08/25/2020   GLUCOSE 107 (H) 08/25/2020   BUN 18 08/25/2020   CREATININE 1.01 08/25/2020   BILITOT 0.5 07/20/2020   ALKPHOS 89 07/20/2020   AST 17 07/20/2020   ALT 30 07/20/2020   PROT 6.7 07/20/2020   ALBUMIN 3.7 (L) 07/20/2020   CALCIUM 8.7 08/25/2020   ANIONGAP 11 04/07/2019   EGFR 86 08/25/2020   Lab Results  Component Value Date   CHOL 190 06/17/2020   Lab Results  Component Value Date   HDL 38 (L) 06/17/2020   Lab Results  Component Value Date   LDLCALC 120 (H) 06/17/2020   Lab Results  Component Value Date   TRIG 178 (H) 06/17/2020   Lab Results  Component Value Date   CHOLHDL 5.0 06/17/2020   Lab Results  Component Value Date   HGBA1C 6.1 (H) 07/08/2020       Assessment & Plan:   1. Other chest pain - Troponin T - EKG 12-Lead  2. Shortness of breath - Troponin T - EKG 12-Lead  3. Pedal edema - EKG 12-Lead  4. Localized skin mass, lump, or swelling - amoxicillin (AMOXIL) 875 MG tablet; Take 1 tablet (875 mg total) by mouth 2 (two) times daily for 5 days.  Dispense: 10 tablet; Refill: 0   Take Amoxicillin 875 mg twice daily for 5 days.   Followup with cardiology and PCP as scheduled Seek emergency medical care immediately for chest pain or shortness of breath  I, Rip Harbour, NP, have reviewed all documentation for this visit. The documentation on 09/02/20 for the exam, diagnosis, procedures, and orders are all accurate and complete.   Follow-up: PRN   Signed, Rip Harbour, NP

## 2020-09-02 NOTE — Progress Notes (Deleted)
Subjective:  Patient ID: Randall Rojas, male    DOB: [birth year]  Age: 60 y.o. MRN: [id number]  Chief Complaint  Patient presents with   Swelling left arm     Two knots.    HPI   Current Outpatient Medications on File Prior to Visit  Medication Sig Dispense Refill   albuterol (VENTOLIN HFA) 108 (90 Base) MCG/ACT inhaler Inhale 2 puffs into the lungs every 6 (six) hours as needed for wheezing or shortness of breath. 8 g 0   ASPIRIN 81 PO Take 1 tablet by mouth daily.     atorvastatin (LIPITOR) 80 MG tablet Take 1 tablet by mouth daily.     baclofen (LIORESAL) 10 MG tablet Take 1-2 tablets by mouth every 6 (six) hours as needed for hallucinations.     bumetanide (BUMEX) 1 MG tablet 1 mg twice daily for 5 days then 1 mg once daily 90 tablet 3   busPIRone (BUSPAR) 5 MG tablet Take 5 mg by mouth 3 (three) times daily.     clopidogrel (PLAVIX) 75 MG tablet Take 1 tablet by mouth daily.     cyanocobalamin 1000 MCG tablet Take 1 tablet by mouth daily.     levETIRAcetam (KEPPRA) 750 MG tablet Take 1 tablet by mouth in the morning and at bedtime.     metolazone (ZAROXOLYN) 5 MG tablet Take 1 tablet (5 mg total) by mouth daily. 3 tablet 0   nitroGLYCERIN (NITROSTAT) 0.4 MG SL tablet Place 1 tablet (0.4 mg total) under the tongue every 5 (five) minutes as needed for chest pain. 90 tablet 3   potassium chloride SA (KLOR-CON) 20 MEQ tablet Take 1 tablet (20 mEq total) by mouth daily. 90 tablet 3   No current facility-administered medications on file prior to visit.   Past Medical History:  Diagnosis Date   Asthma    Chest pain 04/15/2015   Coronary artery calcification seen on CT scan 04/15/2015   Coronary artery disease    Essential hypertension 04/15/2015   Hyperlipidemia 04/15/2015   Morbid obesity (HCC) 04/15/2015   TIA (transient ischemic attack)    Past Surgical History:  Procedure Laterality Date   CARDIAC CATHETERIZATION N/A 04/22/2015   Procedure: Left Heart Cath and Coronary  Angiography;  Surgeon: Peter M [place], MD;  Location[facility] INVASIVE CV LAB;  Service: Cardiovascular;  Laterality: N/A;   GALLBLADDER SURGERY     SINUS SURGERY WITH INSTATRAK      Family History  Problem Relation Age of Onset   Hypertension Mother    Rheum arthritis Mother    Cancer Father    Heart attack Father    Cancer Brother    Hypertension Sister    Social History   Socioeconomic History   Marital status: Married    Spouse name: Not on file   Number of children: Not on file   Years of education: Not on file   Highest education level: Not on file  Occupational History   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  Tobacco Use   Smoking status: Former    Pack years: 0.00    Types: Cigarettes    Quit date: 04/13/1989    Years since quitting: 31.4   Smokeless tobacco: Never  Substance and Sexual Activity   Alcohol use: Not on file   Drug use: Not on file   Sexual activity: Not on file  Other Topics Concern   Not on file  Social History Narrative   Epworth Sleepiness Scale = 7 (as  of 1/18/207)   Social Determinants of Health   Financial Resource Strain: Not on file  Food Insecurity: Not on file  Transportation Needs: Not on file  Physical Activity: Not on file  Stress: Not on file  Social Connections: Not on file    Review of Systems  Constitutional:  Negative for fatigue and fever.  HENT:  Negative for congestion, ear pain, sinus pressure and sore throat.   Eyes:  Negative for pain.  Respiratory:  Negative for cough, chest tightness, shortness of breath and wheezing.   Cardiovascular:  Positive for leg swelling. Negative for chest pain and palpitations.  Gastrointestinal:  Negative for abdominal pain, constipation, diarrhea, nausea and vomiting.  Genitourinary:  Negative for dysuria and hematuria.  Musculoskeletal:  Negative for arthralgias, back pain, joint swelling and myalgias.       Two knots on lower left arm.  Skin:  Negative for rash.  Neurological:  Negative for  dizziness, weakness and headaches.  Psychiatric/Behavioral:  Negative for dysphoric mood. The patient is not nervous/anxious.     Objective:  BP 110/76 (BP Location: Left Arm, Patient Position: Sitting)   Pulse 78   Temp (!) 97.4 F (36.3 C) (Temporal)   Ht 5\' 4"  (1.626 m)   Wt (!) 301 lb (136.5 kg)   SpO2 94%   BMI 51.67 kg/m   BP/Weight 09/02/2020 08/26/2020 08/25/2020  Systolic BP 110 126 150  Diastolic BP 76 84 98  Wt. (Lbs) 301 303.8 303.2  BMI 51.67 52.15 52.04    Physical Exam Constitutional:      Appearance: Normal appearance.  HENT:     Right Ear: Tympanic membrane and external ear normal.     Left Ear: Tympanic membrane and external ear normal.     Nose: Nose normal.     Mouth/Throat:     Mouth: Mucous membranes are moist.  Eyes:     Pupils: Pupils are equal, round, and reactive to light.  Cardiovascular:     Rate and Rhythm: Normal rate and regular rhythm.     Pulses: Normal pulses.     Heart sounds: Normal heart sounds.  Pulmonary:     Breath sounds: Normal breath sounds.  Abdominal:     General: Abdomen is flat. Bowel sounds are normal.     Palpations: Abdomen is soft.  Musculoskeletal:        General: Normal range of motion.     Cervical back: Normal range of motion.  Skin:    General: Skin is warm and dry.  Neurological:     Mental Status: He is alert and oriented to person, place, and time.  Psychiatric:        Mood and Affect: Mood normal.        Behavior: Behavior normal.    Diabetic Foot Exam - Simple   No data filed      Lab Results  Component Value Date   WBC 7.7 06/17/2020   HGB 14.9 06/17/2020   HCT 44.5 06/17/2020   PLT 170 06/17/2020   GLUCOSE 107 (H) 08/25/2020   CHOL 190 06/17/2020   TRIG 178 (H) 06/17/2020   HDL 38 (L) 06/17/2020   LDLCALC 120 (H) 06/17/2020   ALT 30 07/20/2020   AST 17 07/20/2020   NA 140 08/25/2020   K 4.0 08/25/2020   CL 103 08/25/2020   CREATININE 1.01 08/25/2020   BUN 18 08/25/2020   CO2 22  08/25/2020   TSH 0.970 06/17/2020   INR 0.92 04/14/2015  HGBA1C 6.1 (H) 07/08/2020      Assessment & Plan:   1. Other chest pain  2. Shortness of breath  3. Pedal edema    No orders of the defined types were placed in this encounter.   No orders of the defined types were placed in this encounter.     I , Elmer Sow as a scribe for Janie Morning, NP.,have documented all relevant documentation on the behalf of Janie Morning, NP,as directed by  Janie Morning, NP while in the presence of Janie Morning, NP.   Follow-up: No follow-ups on file.  An After Visit Summary was printed and given to the patient.  Janie Morning, NP [facility] [phone]

## 2020-09-02 NOTE — Patient Instructions (Addendum)
Take Amoxicillin 875 mg twice daily for 5 days. Followup with cardiology and PCP as scheduled  Skin Abscess  A skin abscess is an infected area of your skin that contains pus and other material. An abscess can happen in any part of your body. Some abscesses break open (rupture) on their own. Most continue to get worse unless they are treated. The infection can spread deeper into the body and into your blood, which can make you feel sick. A skin abscess is caused by germs that enter the skin through a cut or scrape. It can also be caused by blocked oil and sweat glands or infected hair follicles. This condition is usually treated by: Draining the pus. Taking antibiotic medicines. Placing a warm, wet washcloth over the abscess. Follow these instructions at home: Medicines Take over-the-counter and prescription medicines only as told by your doctor. If you were prescribed an antibiotic medicine, take it as told by your doctor. Do not stop taking the antibiotic even if you start to feel better.   Abscess care If you have an abscess that has not drained, place a warm, clean, wet washcloth over the abscess several times a day. Do this as told by your doctor. Follow instructions from your doctor about how to take care of your abscess. Make sure you: Cover the abscess with a bandage (dressing). Change your bandage or gauze as told by your doctor. Wash your hands with soap and water before you change the bandage or gauze. If you cannot use soap and water, use hand sanitizer. Check your abscess every day for signs that the infection is getting worse. Check for: More redness, swelling, or pain. More fluid or blood. Warmth. More pus or a bad smell.   General instructions To avoid spreading the infection: Do not share personal care items, towels, or hot tubs with others. Avoid making skin-to-skin contact with other people. Keep all follow-up visits as told by your doctor. This is important. Contact  a doctor if: You have more redness, swelling, or pain around your abscess. You have more fluid or blood coming from your abscess. Your abscess feels warm when you touch it. You have more pus or a bad smell coming from your abscess. You have a fever. Your muscles ache. You have chills. You feel sick. Get help right away if: You have very bad (severe) pain. You see red streaks on your skin spreading away from the abscess. Summary A skin abscess is an infected area of your skin that contains pus and other material. The abscess is caused by germs that enter the skin through a cut or scrape. It can also be caused by blocked oil and sweat glands or infected hair follicles. Follow your doctor's instructions on caring for your abscess, taking medicines, preventing infections, and keeping follow-up visits. This information is not intended to replace advice given to you by your health care provider. Make sure you discuss any questions you have with your health care provider. Document Revised: 10/17/2018 Document Reviewed: 04/26/2017 Elsevier Patient Education  2021 [employer].

## 2020-09-06 NOTE — Telephone Encounter (Signed)
Urologist now filling out forms- check returned to pt.  09/06/2020

## 2020-09-06 NOTE — Telephone Encounter (Signed)
Spoke with patient about getting his disability paperwork filled out. Dr. Servando Salina spoke with patient, he was informed again his PCP or Neurologist would need to fill out his paperwork due to the nature of the diagnoses. Patient verbalizes understanding. No further questions or concerns at this time.

## 2020-09-06 NOTE — Telephone Encounter (Signed)
    Pt is calling back to follow up about his disability paperwork, he said he needs a callback today

## 2020-09-12 ENCOUNTER — Telehealth: Payer: Self-pay | Admitting: Student in an Organized Health Care Education/Training Program

## 2020-09-12 NOTE — Telephone Encounter (Signed)
Paged overnight by operator regarding Randall Rojas with recurrent fluid build up, 12 lb wt gain, SOB, and left leg/arm numbness 2 days duration. Attempted to contact family with no response. Left message instructing patient/wife to go to ED if he is having ongoing leg/arm numbness as this could be stroke sx. Hard to know without talking to them on phone and already 2 days out. The rest of the sx seem like HF. Asked them to page [place] again if they need assistance/guidance but if he is having sx as stated above he needs ED eval.

## 2020-09-13 ENCOUNTER — Telehealth: Payer: Self-pay | Admitting: Cardiology

## 2020-09-13 NOTE — Telephone Encounter (Signed)
New message:     Patient returning a call back from last night. Patient wife missed the call at 3 am. Please call patient wife.

## 2020-09-13 NOTE — Telephone Encounter (Signed)
Spoke to the patients wife just now. She states that he feels like his left side is numb. His leg and his arm are both numb. She states that he is able to move them but he is numb. She states that he also is having chest pain. He just took a nitroglycerin for this chest pain.   I advised that he needs to go to the emergency room ASAP as these symptoms are very concerning. She is going to call 911 at this time to get him transferred to the hospital.

## 2020-09-14 DIAGNOSIS — I2510 Atherosclerotic heart disease of native coronary artery without angina pectoris: Secondary | ICD-10-CM | POA: Diagnosis not present

## 2020-09-14 DIAGNOSIS — R200 Anesthesia of skin: Secondary | ICD-10-CM

## 2020-09-14 DIAGNOSIS — I5033 Acute on chronic diastolic (congestive) heart failure: Secondary | ICD-10-CM | POA: Diagnosis not present

## 2020-09-14 DIAGNOSIS — E669 Obesity, unspecified: Secondary | ICD-10-CM | POA: Diagnosis not present

## 2020-09-14 DIAGNOSIS — G459 Transient cerebral ischemic attack, unspecified: Secondary | ICD-10-CM | POA: Diagnosis not present

## 2020-09-24 ENCOUNTER — Ambulatory Visit (INDEPENDENT_AMBULATORY_CARE_PROVIDER_SITE_OTHER): Payer: Commercial Managed Care - PPO | Admitting: Cardiology

## 2020-09-24 ENCOUNTER — Ambulatory Visit (INDEPENDENT_AMBULATORY_CARE_PROVIDER_SITE_OTHER): Payer: Commercial Managed Care - PPO | Admitting: Physician Assistant

## 2020-09-24 ENCOUNTER — Other Ambulatory Visit: Payer: Self-pay

## 2020-09-24 ENCOUNTER — Encounter: Payer: Self-pay | Admitting: Physician Assistant

## 2020-09-24 VITALS — BP 146/84 | HR 75 | Ht 64.0 in | Wt 309.8 lb

## 2020-09-24 VITALS — BP 130/70 | HR 76 | Temp 97.3°F | Ht 64.0 in | Wt 307.0 lb

## 2020-09-24 DIAGNOSIS — R519 Headache, unspecified: Secondary | ICD-10-CM

## 2020-09-24 DIAGNOSIS — I10 Essential (primary) hypertension: Secondary | ICD-10-CM

## 2020-09-24 DIAGNOSIS — R0789 Other chest pain: Secondary | ICD-10-CM

## 2020-09-24 DIAGNOSIS — I5032 Chronic diastolic (congestive) heart failure: Secondary | ICD-10-CM | POA: Diagnosis not present

## 2020-09-24 DIAGNOSIS — E782 Mixed hyperlipidemia: Secondary | ICD-10-CM

## 2020-09-24 DIAGNOSIS — F419 Anxiety disorder, unspecified: Secondary | ICD-10-CM | POA: Diagnosis not present

## 2020-09-24 DIAGNOSIS — G459 Transient cerebral ischemic attack, unspecified: Secondary | ICD-10-CM | POA: Diagnosis not present

## 2020-09-24 DIAGNOSIS — R739 Hyperglycemia, unspecified: Secondary | ICD-10-CM

## 2020-09-24 DIAGNOSIS — I2510 Atherosclerotic heart disease of native coronary artery without angina pectoris: Secondary | ICD-10-CM

## 2020-09-24 DIAGNOSIS — R7303 Prediabetes: Secondary | ICD-10-CM

## 2020-09-24 MED ORDER — BUMETANIDE 2 MG PO TABS: 2.0000 mg | ORAL_TABLET | Freq: Two times a day (BID) | ORAL | 3 refills | Status: DC

## 2020-09-24 MED ORDER — POTASSIUM CHLORIDE CRYS ER 20 MEQ PO TBCR: 20.0000 meq | EXTENDED_RELEASE_TABLET | Freq: Two times a day (BID) | ORAL | 3 refills | Status: DC

## 2020-09-24 MED ORDER — METOLAZONE 5 MG PO TABS: ORAL_TABLET | ORAL | 0 refills | Status: DC

## 2020-09-24 MED ORDER — RANOLAZINE ER 1000 MG PO TB12: 1000.0000 mg | ORAL_TABLET | Freq: Two times a day (BID) | ORAL | 3 refills | Status: DC

## 2020-09-24 NOTE — Patient Instructions (Signed)
Medication Instructions:  Your physician has recommended you make the following change in your medication:  STOP: Imdur START: Ranexa 1000 mg twice daily START: Metolazone 5 mg for 3 days 30 minutes before first Bumex dose INCREASE: Bumex 2 mg twice daily INCREASE: Potassium 20 meq twice daily   *If you need a refill on your cardiac medications before your next appointment, please call your pharmacy*   Lab Work: None If you have labs (blood work) drawn today and your tests are completely normal, you will receive your results only by: MyChart Message (if you have MyChart) OR A paper copy in the mail If you have any lab test that is abnormal or we need to change your treatment, we will call you to review the results.   Testing/Procedures: None   Follow-Up: At [facility], you and your health needs are our priority.  As part of our continuing mission to provide you with exceptional heart care, we have created designated Provider Care Teams.  These Care Teams include your primary Cardiologist (physician) and Advanced Practice Providers (APPs -  Physician Assistants and Nurse Practitioners) who all work together to provide you with the care you need, when you need it.  We recommend signing up for the patient portal called "MyChart".  Sign up information is provided on this After Visit Summary.  MyChart is used to connect with patients for Virtual Visits (Telemedicine).  Patients are able to view lab/test results, encounter notes, upcoming appointments, etc.  Non-urgent messages can be sent to your provider as well.   To learn more about what you can do with MyChart, go to [email or web address].    Your next appointment:   First week in August  The format for your next appointment:   In Person  Provider:   Thomasene Ripple, DO   Other Instructions

## 2020-09-24 NOTE — Progress Notes (Signed)
Cardiology Office Note:    Date:  09/24/2020   ID:  Randall Rojas, DOB 03/20/1961, MRN [id number]  PCP:  Marianne Sofiaavis, Sara, PA-C  Cardiologist:  Thomasene RippleKardie Cordera Stineman, DO  Electrophysiologist:  None   Referring MD: Marianne Sofiaavis, Sara, PA-C   Chief Complaint  Patient presents with   Medication Management    Imdur not helping Question about taking Bumex w/ Potassium CL    History of Present Illness:    Randall Rojas is a 60 y.o. male with a hx of coronary artery disease with recent nuclear stress test showing no evidence of ischemia, recent TIA the patient is on dual antiplatelet therapy, hypertension, hyperlipidemia, chronic diastolic heart failure with his recent EF in May 20 20-50 5 to 60%.   I last saw the patient on Aug 05, 2020, and prior to that visit the patient reported that he had been admitted twice at the [facility] first for concern for stroke.  Both visits there was concern for TIA.  The second visit which was on July 11, 2020 the patient has significant headache along with left-sided weakness and numbness.  He was then admitted to [facility].  During that time he underwent multiple testing modalities his MRI demonstrated no acute cranial abnormalities, his CTA showed 50% stenosis of the right ICA and less than 50% stenosis of the left ICA.  The patient did see neurology while he was in the hospital he was started on dual antiplatelet therapy for 3 months and then will transition to aspirin only.  He is now following with neurology.  He was started on statins as well.   At the conclusion of his Aug 05, 2020 visit he reported that he was experiencing some fluttering at therefore place a monitor on the patient.  At that time he also appeared to be fluid overloaded we restarted his Lasix.   Since I last saw the patient he had been admitted at [facility] with a headache concerns that the patient may be experiencing multiple episodes of seizure.   He is being worked up by neurology there.  He is planning on undergoing a inpatient continuous EEG [facility].   I saw the patient in August 25, 2020 at that time we started him from Lasix to Bumex and he did not have any angina.  He ended up at [facility] where he was treated for heart failure and did have some chest pain which started Imdur on the patient.  Is here today for follow-up visit.  Post hospitalization he was doing well.  On his 1 mg Bumex twice daily but now he has had worsening leg edema.  He has had intermittent chest pain on the Imdur.  He is having more headaches.  Past Medical History:  Diagnosis Date   Asthma    Chest pain 04/15/2015   Coronary artery calcification seen on CT scan 04/15/2015   Coronary artery disease    Essential hypertension 04/15/2015   Hyperlipidemia 04/15/2015   Morbid obesity (HCC) 04/15/2015   TIA (transient ischemic attack)     Past Surgical History:  Procedure Laterality Date   CARDIAC CATHETERIZATION N/A 04/22/2015   Procedure: Left Heart Cath and Coronary Angiography;  Surgeon: Peter M SwazilandJordan, MD;  [facility];  Service: Cardiovascular;  Laterality: N/A;   GALLBLADDER SURGERY     SINUS SURGERY WITH INSTATRAK      Current Medications: Current Meds  Medication  Sig   albuterol (VENTOLIN HFA) 108 (90 Base) MCG/ACT inhaler Inhale 2 puffs into the lungs every 6 (six) hours as needed for wheezing or shortness of breath.   ASPIRIN 81 PO Take 1 tablet by mouth daily.   atorvastatin (LIPITOR) 80 MG tablet Take 1 tablet by mouth daily.   baclofen (LIORESAL) 10 MG tablet Take 1-2 tablets by mouth every 6 (six) hours as needed for hallucinations.   bumetanide (BUMEX) 2 MG tablet Take 1 tablet (2 mg total) by mouth 2 (two) times daily.   busPIRone (BUSPAR) 5 MG tablet Take 5 mg by mouth 3 (three) times daily.   clopidogrel (PLAVIX) 75 MG tablet Take 1 tablet by mouth daily.   cyanocobalamin 1000 MCG tablet  Take 1 tablet by mouth daily.   diclofenac (VOLTAREN) 75 MG EC tablet Take 75 mg by mouth 2 (two) times daily.   levETIRAcetam (KEPPRA) 750 MG tablet Take 1 tablet by mouth in the morning and at bedtime.   metolazone (ZAROXOLYN) 5 MG tablet Take 30 minutes before first Bumex for 3 days.   nitroGLYCERIN (NITROSTAT) 0.4 MG SL tablet Place 1 tablet (0.4 mg total) under the tongue every 5 (five) minutes as needed for chest pain.   potassium chloride SA (KLOR-CON) 20 MEQ tablet Take 1 tablet (20 mEq total) by mouth 2 (two) times daily.   ranolazine (RANEXA) 1000 MG SR tablet Take 1 tablet (1,000 mg total) by mouth 2 (two) times daily.   [DISCONTINUED] bumetanide (BUMEX) 1 MG tablet 1 mg twice daily for 5 days then 1 mg once daily (Patient taking differently: Take 1 mg by mouth 2 (two) times daily. 1 mg twice daily for 5 days then 1 mg once daily)   [DISCONTINUED] isosorbide mononitrate (IMDUR) 15 mg TB24 24 hr tablet Take 15 mg by mouth daily.   [DISCONTINUED] potassium chloride SA (KLOR-CON) 20 MEQ tablet Take 1 tablet (20 mEq total) by mouth daily.     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  Tobacco Use   Smoking status: Former    Pack years: 0.00    Types: Cigarettes    Quit date: 04/13/1989    Years since quitting: 31.4   Smokeless tobacco: Never  Substance and Sexual Activity   Alcohol use: Not on file   Drug use: Not on file   Sexual activity: Not on file  Other Topics Concern   Not on file  Social History Narrative   Epworth Sleepiness Scale = 7 (as of 1/18/207)   Social Determinants of Health   Financial Resource Strain: Not on file  Food Insecurity: Not on file  Transportation Needs: Not on file  Physical Activity: Not on file   Stress: Not on file  Social Connections: Not on file     Family History: The patient's family history includes Cancer in his brother and father; Heart attack in his father; Hypertension in his mother and sister; Rheum arthritis in his mother.  ROS:   Review of Systems  Constitution: Negative for decreased appetite, fever and weight gain.  HENT: Negative for congestion, ear discharge, hoarse voice and sore throat.   Eyes: Negative for discharge, redness, vision loss in right eye and  visual halos.  Cardiovascular: Negative for chest pain, dyspnea on exertion, leg swelling, orthopnea and palpitations.  Respiratory: Negative for cough, hemoptysis, shortness of breath and snoring.   Endocrine: Negative for heat intolerance and polyphagia.  Hematologic/Lymphatic: Negative for bleeding problem. Does not bruise/bleed easily.  Skin: Negative for flushing, nail changes, rash and suspicious lesions.  Musculoskeletal: Negative for arthritis, joint pain, muscle cramps, myalgias, neck pain and stiffness.  Gastrointestinal: Negative for abdominal pain, bowel incontinence, diarrhea and excessive appetite.  Genitourinary: Negative for decreased libido, genital sores and incomplete emptying.  Neurological: Negative for brief paralysis, focal weakness, headaches and loss of balance.  Psychiatric/Behavioral: Negative for altered mental status, depression and suicidal ideas.  Allergic/Immunologic: Negative for HIV exposure and persistent infections.    EKGs/Labs/Other Studies Reviewed:    The following studies were reviewed today:   EKG: None today   ZIO monitor Patch Wear Time:  3 days and 10 hours starting Aug 05, 2020 Indication: Palpitations   Patient had a min HR of 51 bpm, max HR of 120 bpm, and avg HR of 83 bpm.   Predominant underlying rhythm was Sinus Rhythm.   Premature ventricular complexes were rare.   Premature atrial complex were rare.   No ventricular tachycardia, no pauses,  no supraventricular tachycardia no atrial fibrillation noted.   Symptoms associated with sinus rhythm.   Conclusion: Normal/unremarkable study with no evidence of significant arrhythmia     Myocardial perfusion study July 07, 2020 Nuclear stress EF: 59%. The left ventricular ejection fraction is normal (55-65%). There was no ST segment deviation noted during stress. Defect 1: There is a small defect of mild severity present in the apical inferior location. This is a low risk study. No evidence of ischemia or MI. Fixed defect involving apical portion of the inferior wall represents attenuation.   Echocardiogram done on Jul 26, 2020 at [facility] SUMMARY  Injection of agitated saline contrast performed to evaluate for  possible shunt  Image Quality: Poor.  Left ventricular systolic function is normal.  LV ejection fraction = 55-60%.  There is no pericardial effusion.  Injection of agitated saline did not show a right-to-left shunt on  limited study.   Probably no significant change in comparison with the prior study  -  FINDINGS:  LEFT VENTRICLE  The left ventricular size is normal. Mild left ventricular  hypertrophy. LV ejection fraction = 55-60%. Left ventricular systolic  function is normal. Unable to fully assess LV regional wall motion.      Recent Labs: 06/17/2020: Hemoglobin 14.9; Platelets 170; TSH 0.970 07/20/2020: ALT 30 08/25/2020: BUN 18; Creatinine, Ser 1.01; Magnesium 2.1; Potassium 4.0; Sodium 140  Recent Lipid Panel    Component Value Date/Time   CHOL 190 06/17/2020 1034   TRIG 178 (H) 06/17/2020 1034   HDL 38 (L) 06/17/2020 1034   CHOLHDL 5.0 06/17/2020 1034   CHOLHDL 6.2 04/07/2019 0334   VLDL 16 04/07/2019 0334   LDLCALC 120 (H) 06/17/2020 1034    Physical Exam:    VS:  BP (!) 146/84 (BP Location: Right Arm, Patient Position: Sitting)   Pulse 75   Ht 5\' 4"  (1.626 m)   Wt (!) 309 lb 12.8 oz (140.5 kg)   SpO2 95%   BMI 53.18  kg/m     Wt Readings from Last 3 Encounters:  09/24/20 (!) 309 lb 12.8 oz (140.5 kg)  09/24/20 (!) 307 lb (139.3 kg)  09/02/20 (!) 301 lb (136.5 kg)  GEN: Well nourished, well developed in no acute distress HEENT: Normal NECK: No JVD; No carotid bruits LYMPHATICS: No lymphadenopathy CARDIAC: S1S2 noted,RRR, no murmurs, rubs, gallops RESPIRATORY:  Clear to auscultation without rales, wheezing or rhonchi  ABDOMEN: Soft, non-tender, non-distended, +bowel sounds, no guarding. EXTREMITIES: No edema, No cyanosis, no clubbing MUSCULOSKELETAL:  No deformity  SKIN: Warm and dry NEUROLOGIC:  Alert and oriented x 3, non-focal PSYCHIATRIC:  Normal affect, good insight  ASSESSMENT:    1. Chronic heart failure with preserved ejection fraction (HCC)   2. TIA (transient ischemic attack)   3. Essential hypertension   4. Coronary artery disease involving native coronary artery of native heart without angina pectoris   5. Mixed hyperlipidemia   6. Obesity, Class III, BMI 40-49.9 (morbid obesity) (HCC)   7. Prediabetes    PLAN:     1.  He has had worsening leg edema I am going to increase his Bumex to twice daily.  We given 3 days of metolazone 5 mg 30 minutes prior to his first Bumex dose of today. 2.  In terms of her chest discomfort we are going to stop the Imdur starting Nulecit 500 mg twice a day.  If the patient does not respond to any of his regimen as listed above the next plan will be a right and left heart catheterization.  His sleep study is still pending. His work-up for neurology as well as his strokes and seizure are pending he is going to be admitted [facility] on October 19, 2020 for a 3-day work-up.  The patient understands the need to lose weight with diet and exercise. We have discussed specific strategies for this.  In the meantime continue his antihypertensive regimen.  Blood work was drawn at the PCP office this morning we will be able to review test  once is resulted.  The patient is in agreement with the above plan. The patient left the office in stable condition.  The patient will follow up in 4 weeks or sooner if needed.   Medication Adjustments/Labs and Tests Ordered: Current medicines are reviewed at length with the patient today.  Concerns regarding medicines are outlined above.  No orders of the defined types were placed in this encounter.  Meds ordered this encounter  Medications   metolazone (ZAROXOLYN) 5 MG tablet    Sig: Take 30 minutes before first Bumex for 3 days.    Dispense:  3 tablet    Refill:  0   bumetanide (BUMEX) 2 MG tablet    Sig: Take 1 tablet (2 mg total) by mouth 2 (two) times daily.    Dispense:  180 tablet    Refill:  3   ranolazine (RANEXA) 1000 MG SR tablet    Sig: Take 1 tablet (1,000 mg total) by mouth 2 (two) times daily.    Dispense:  180 tablet    Refill:  3   potassium chloride SA (KLOR-CON) 20 MEQ tablet    Sig: Take 1 tablet (20 mEq total) by mouth 2 (two) times daily.    Dispense:  180 tablet    Refill:  3     Patient Instructions  Medication Instructions:  Your physician has recommended you make the following change in your medication:  STOP: Imdur START: Ranexa 1000 mg twice daily START: Metolazone 5 mg for 3 days 30 minutes before first Bumex dose INCREASE: Bumex 2 mg twice daily INCREASE: Potassium 20 meq twice daily   *If you need a refill  on your cardiac medications before your next appointment, please call your pharmacy*   Lab Work: None If you have labs (blood work) drawn today and your tests are completely normal, you will receive your results only by: MyChart Message (if you have MyChart) OR A paper copy in the mail If you have any lab test that is abnormal or we need to change your treatment, we will call you to review the results.   Testing/Procedures: None   Follow-Up: At [facility], you and your health needs are our priority.  As part of our  continuing mission to provide you with exceptional heart care, we have created designated Provider Care Teams.  These Care Teams include your primary Cardiologist (physician) and Advanced Practice Providers (APPs -  Physician Assistants and Nurse Practitioners) who all work together to provide you with the care you need, when you need it.  We recommend signing up for the patient portal called "MyChart".  Sign up information is provided on this After Visit Summary.  MyChart is used to connect with patients for Virtual Visits (Telemedicine).  Patients are able to view lab/test results, encounter notes, upcoming appointments, etc.  Non-urgent messages can be sent to your provider as well.   To learn more about what you can do with MyChart, go to [email or web address].    Your next appointment:   First week in August  The format for your next appointment:   In Person  Provider:   Thomasene Ripple, DO   Other Instructions    Adopting a Healthy Lifestyle.  Know what a healthy weight is for you (roughly BMI <25) and aim to maintain this   Aim for 7+ servings of fruits and vegetables daily   65-80+ fluid ounces of water or unsweet tea for healthy kidneys   Limit to max 1 drink of alcohol per day; avoid smoking/tobacco   Limit animal fats in diet for cholesterol and heart health - choose grass fed whenever available   Avoid highly processed foods, and foods high in saturated/trans fats   Aim for low stress - take time to unwind and care for your mental health   Aim for 150 min of moderate intensity exercise weekly for heart health, and weights twice weekly for bone health   Aim for 7-9 hours of sleep daily   When it comes to diets, agreement about the perfect plan isnt easy to find, even among the experts. Experts at the [facility] [employer] developed an idea known as the Healthy Eating Plate. Just imagine a plate divided into logical, healthy portions.   The emphasis is on  diet quality:   Load up on vegetables and fruits - one-half of your plate: Aim for color and variety, and remember that potatoes dont count.   Go for whole grains - one-quarter of your plate: Whole wheat, barley, wheat berries, quinoa, oats, brown rice, and foods made with them. If you want pasta, go with whole wheat pasta.   Protein power - one-quarter of your plate: Fish, chicken, beans, and nuts are all healthy, versatile protein sources. Limit red meat.   The diet, however, does go beyond the plate, offering a few other suggestions.   Use healthy plant oils, such as olive, canola, soy, corn, sunflower and peanut. Check the labels, and avoid partially hydrogenated oil, which have unhealthy trans fats.   If youre thirsty, drink water. Coffee and tea are good in moderation, but skip sugary drinks and limit milk and dairy products  to one or two daily servings.   The type of carbohydrate in the diet is more important than the amount. Some sources of carbohydrates, such as vegetables, fruits, whole grains, and beans-are healthier than others.   Finally, stay active  Signed, Thomasene Ripple, DO  09/24/2020 1:24 PM    [facility]

## 2020-09-24 NOTE — Progress Notes (Signed)
Established Patient Office Visit  Subjective:  Patient ID: Randall Rojas, male    DOB: 1960/09/25  Age: 60 y.o. MRN: [id number]  CC:  Chief Complaint  Patient presents with   Hyperlipidemia   Hypertension    HPI Randall Rojas presents for follow up of localized edema and hyperlipidemia  Mixed hyperlipidemia  Pt presents with hyperlipidemia. Compliance with treatment has been fair; The patient maintains a low cholesterol diet , follows up as directed ,  The patient denies experiencing any hypercholesterolemia related symptoms.  Evidenced based information based on history , exam, and other sources has been used  for decision making. Currently on lipitor 67m qd  Pt has history of edema - he takes bumex 162mprn swelling along with potassiumm 2026m states he has had more swelling recently but will be seeing cardiologist this morning to discuss - of note pt has had echo in the past few months  Pt has history of elevated glucose in past - currently not on medications - due for labwork to check hgb a1c  Pt continues to have intermittent chest pains - he does follow up with cardiology today - currently on imdur and nitro  Pt has been following with neurology for complaints of headaches and TIA symptoms - he has been placed on Keppra and will be having an admission later this month for a seizure study  Past Medical History:  Diagnosis Date   Asthma    Chest pain 04/15/2015   Coronary artery calcification seen on CT scan 04/15/2015   Coronary artery disease    Essential hypertension 04/15/2015   Hyperlipidemia 04/15/2015   Morbid obesity ([facility]/19/2017   TIA (transient ischemic attack)     Past Surgical History:  Procedure Laterality Date   CARDIAC CATHETERIZATION N/A 04/22/2015   Procedure: Left Heart Cath and Coronary Angiography;  Surgeon: Peter M JorMartiniqueD;  Location: [facility];  Service: Cardiovascular;  Laterality: N/A;   GALLBLADDER SURGERY     SINUS SURGERY WITH  INSTATRAK      Family History  Problem Relation Age of Onset   Hypertension Mother    Rheum arthritis Mother    Cancer Father    Heart attack Father    Cancer Brother    Hypertension Sister     Social History   Socioeconomic History   Marital status: Married    Spouse name: Not on file   Number of children: Not on file   Years of education: Not on file   Highest education level: Not on file  Occupational History   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  Tobacco Use   Smoking status: Former    Pack years: 0.00    Types: Cigarettes    Quit date: 04/13/1989    Years since quitting: 31.4   Smokeless tobacco: Never  Substance and Sexual Activity   Alcohol use: Not on file   Drug use: Not on file   Sexual activity: Not on file  Other Topics Concern   Not on file  Social History Narrative   Epworth Sleepiness Scale = 7 (as of 1/18/207)   Social Determinants of Health   Financial Resource Strain: Not on file  Food Insecurity: Not on file  Transportation Needs: Not on file  Physical Activity: Not on file  Stress: Not on file  Social Connections: Not on file  Intimate Partner Violence: Not on file     Current Outpatient Medications:    isosorbide mononitrate (IMDUR) 15 mg  TB24 24 hr tablet, Take 15 mg by mouth daily., Disp: , Rfl:    albuterol (VENTOLIN HFA) 108 (90 Base) MCG/ACT inhaler, Inhale 2 puffs into the lungs every 6 (six) hours as needed for wheezing or shortness of breath., Disp: 8 g, Rfl: 0   ASPIRIN 81 PO, Take 1 tablet by mouth daily., Disp: , Rfl:    atorvastatin (LIPITOR) 80 MG tablet, Take 1 tablet by mouth daily., Disp: , Rfl:    baclofen (LIORESAL) 10 MG tablet, Take 1-2 tablets by mouth every 6 (six) hours as needed for hallucinations., Disp: , Rfl:    bumetanide (BUMEX) 1 MG tablet, 1 mg twice daily for 5 days then 1 mg once daily, Disp: 90 tablet, Rfl: 3   busPIRone (BUSPAR) 5 MG tablet, Take 5 mg by mouth 3 (three) times daily., Disp: , Rfl:    clopidogrel  (PLAVIX) 75 MG tablet, Take 1 tablet by mouth daily., Disp: , Rfl:    cyanocobalamin 1000 MCG tablet, Take 1 tablet by mouth daily., Disp: , Rfl:    diclofenac (VOLTAREN) 75 MG EC tablet, Take 75 mg by mouth 2 (two) times daily., Disp: , Rfl:    levETIRAcetam (KEPPRA) 750 MG tablet, Take 1 tablet by mouth in the morning and at bedtime., Disp: , Rfl:    metolazone (ZAROXOLYN) 5 MG tablet, Take 1 tablet (5 mg total) by mouth daily., Disp: 3 tablet, Rfl: 0   nitroGLYCERIN (NITROSTAT) 0.4 MG SL tablet, Place 1 tablet (0.4 mg total) under the tongue every 5 (five) minutes as needed for chest pain., Disp: 90 tablet, Rfl: 3   potassium chloride SA (KLOR-CON) 20 MEQ tablet, Take 1 tablet (20 mEq total) by mouth daily., Disp: 90 tablet, Rfl: 3   Allergies  Allergen Reactions   Cephalexin Nausea And Vomiting and Rash    ROS CONSTITUTIONAL: has had some fatigue E/N/T: Negative for ear pain, nasal congestion and sore throat.  CARDIOVASCULAR: Negative for chest pain, dizziness, palpitations - does have pedal edema RESPIRATORY: Negative for recent cough and dyspnea.  GASTROINTESTINAL: Negative for abdominal pain, acid reflux symptoms, constipation, diarrhea, nausea and vomiting.  MSK: Negative for arthralgias and myalgias.  INTEGUMENTARY: Negative for rash.  NEUROLOGICAL:see HPI PSYCHIATRIC: Negative for sleep disturbance and to question depression screen.  Negative for depression, negative for anhedonia.        Objective:    PHYSICAL EXAM:   VS: BP 130/70   Pulse 76   Temp (!) 97.3 F (36.3 C)   Ht _0  (1.626 m)   Wt (!) 307 lb (139.3 kg)   SpO2 97%   BMI 52.70 kg/m  PHYSICAL EXAM:   VS: BP 130/70   Pulse 76   Temp (!) 97.3 F (36.3 C)   Ht _1  (1.626 m)   Wt (!) 307 lb (139.3 kg)   SpO2 97%   BMI 52.70 kg/m   GEN: Well nourished, well developed, in no acute distress  HEENT: normal external ears and nose - normal external auditory canals and TMS - hearing grossly normal -  normal nasal mucosa and septum - Lips, Teeth and Gums - normal  Oropharynx - normal mucosa, palate, and posterior pharynx Neck: no JVD or masses - no thyromegaly Cardiac: RRR; no murmurs, rubs, or gallops,no edema - no significant varicosities Respiratory:  normal respiratory rate and pattern with no distress - normal breath sounds with no rales, rhonchi, wheezes or rubs Skin: warm and dry, no rash  Psych: euthymic mood, appropriate  affect and demeanor   BP 130/70   Pulse 76   Temp (!) 97.3 F (36.3 C)   Ht _0  (1.626 m)   Wt (!) 307 lb (139.3 kg)   SpO2 97%   BMI 52.70 kg/m  Wt Readings from Last 3 Encounters:  09/24/20 (!) 307 lb (139.3 kg)  09/02/20 (!) 301 lb (136.5 kg)  08/26/20 (!) 303 lb 12.8 oz (137.8 kg)     There are no preventive care reminders to display for this patient.   There are no preventive care reminders to display for this patient.  Lab Results  Component Value Date   TSH 0.970 06/17/2020   Lab Results  Component Value Date   WBC 7.7 06/17/2020   HGB 14.9 06/17/2020   HCT 44.5 06/17/2020   MCV 91 06/17/2020   PLT 170 06/17/2020   Lab Results  Component Value Date   NA 140 08/25/2020   K 4.0 08/25/2020   CO2 22 08/25/2020   GLUCOSE 107 (H) 08/25/2020   BUN 18 08/25/2020   CREATININE 1.01 08/25/2020   BILITOT 0.5 07/20/2020   ALKPHOS 89 07/20/2020   AST 17 07/20/2020   ALT 30 07/20/2020   PROT 6.7 07/20/2020   ALBUMIN 3.7 (L) 07/20/2020   CALCIUM 8.7 08/25/2020   ANIONGAP 11 04/07/2019   EGFR 86 08/25/2020   Lab Results  Component Value Date   CHOL 190 06/17/2020   Lab Results  Component Value Date   HDL 38 (L) 06/17/2020   Lab Results  Component Value Date   LDLCALC 120 (H) 06/17/2020   Lab Results  Component Value Date   TRIG 178 (H) 06/17/2020   Lab Results  Component Value Date   CHOLHDL 5.0 06/17/2020   Lab Results  Component Value Date   HGBA1C 6.1 (H) 07/08/2020      Assessment & Plan:   Problem List  Items Addressed This Visit       Cardiovascular and Mediastinum   Hypertension - Primary   Relevant Medications   isosorbide mononitrate (IMDUR) 15 mg TB24 24 hr tablet   Other Relevant Orders   CBC with Differential/Platelet   Comprehensive metabolic panel   TSH Follow up with cardiology     Other   Mixed hyperlipidemia   Relevant Medications   isosorbide mononitrate (IMDUR) 15 mg TB24 24 hr tablet   Other Relevant Orders   Lipid panel   Chest pain Continue follow up with cardiologist Continue current meds   Hyperglycemia   Relevant Orders   Hemoglobin A1c      Frequent headaches   Relevant Medications   diclofenac (VOLTAREN) 75 MG EC tablet Follow up with neurology as directed    No orders of the defined types were placed in this encounter.   Follow-up: Return in about 3 months (around 12/25/2020) for chronic fasting.    SARA R Karam Dunson, PA-C

## 2020-09-25 LAB — COMPREHENSIVE METABOLIC PANEL
ALT: 24 IU/L (ref 0–44)
AST: 18 IU/L (ref 0–40)
Albumin/Globulin Ratio: 1.7 (ref 1.2–2.2)
Albumin: 4.1 g/dL (ref 3.8–4.9)
Alkaline Phosphatase: 104 IU/L (ref 44–121)
BUN/Creatinine Ratio: 15 (ref 9–20)
BUN: 17 mg/dL (ref 6–24)
Bilirubin Total: 0.6 mg/dL (ref 0.0–1.2)
CO2: 27 mmol/L (ref 20–29)
Calcium: 9.1 mg/dL (ref 8.7–10.2)
Chloride: 105 mmol/L (ref 96–106)
Creatinine, Ser: 1.13 mg/dL (ref 0.76–1.27)
Globulin, Total: 2.4 g/dL (ref 1.5–4.5)
Glucose: 100 mg/dL — ABNORMAL HIGH (ref 65–99)
Potassium: 4.7 mmol/L (ref 3.5–5.2)
Sodium: 144 mmol/L (ref 134–144)
Total Protein: 6.5 g/dL (ref 6.0–8.5)
eGFR: 75 mL/min/{1.73_m2} (ref >59)

## 2020-09-25 LAB — CBC WITH DIFFERENTIAL/PLATELET
Basophils Absolute: 0 10*3/uL (ref 0.0–0.2)
Basos: 0 %
EOS (ABSOLUTE): 0.3 10*3/uL (ref 0.0–0.4)
Eos: 4 %
Hematocrit: 45.8 % (ref 37.5–51.0)
Hemoglobin: 15 g/dL (ref 13.0–17.7)
Immature Grans (Abs): 0 10*3/uL (ref 0.0–0.1)
Immature Granulocytes: 0 %
Lymphocytes Absolute: 2.7 10*3/uL (ref 0.7–3.1)
Lymphs: 34 %
MCH: 31 pg (ref 26.6–33.0)
MCHC: 32.8 g/dL (ref 31.5–35.7)
MCV: 95 fL (ref 79–97)
Monocytes Absolute: 0.6 10*3/uL (ref 0.1–0.9)
Monocytes: 7 %
Neutrophils Absolute: 4.4 10*3/uL (ref 1.4–7.0)
Neutrophils: 55 %
Platelets: 163 10*3/uL (ref 150–450)
RBC: 4.84 x10E6/uL (ref 4.14–5.80)
RDW: 13.6 % (ref 11.6–15.4)
WBC: 8 10*3/uL (ref 3.4–10.8)

## 2020-09-25 LAB — LIPID PANEL
Chol/HDL Ratio: 3.3 ratio (ref 0.0–5.0)
Cholesterol, Total: 105 mg/dL (ref 100–199)
HDL: 32 mg/dL — ABNORMAL LOW (ref >39)
LDL Chol Calc (NIH): 53 mg/dL (ref 0–99)
Triglycerides: 110 mg/dL (ref 0–149)
VLDL Cholesterol Cal: 20 mg/dL (ref 5–40)

## 2020-09-25 LAB — TSH: TSH: 2.04 u[IU]/mL (ref 0.450–4.500)

## 2020-09-25 LAB — HEMOGLOBIN A1C
Est. average glucose Bld gHb Est-mCnc: 134 mg/dL
Hgb A1c MFr Bld: 6.3 % — ABNORMAL HIGH (ref 4.8–5.6)

## 2020-09-25 LAB — CARDIOVASCULAR RISK ASSESSMENT

## 2020-09-28 ENCOUNTER — Telehealth: Payer: Self-pay | Admitting: Cardiology

## 2020-09-28 NOTE — Telephone Encounter (Signed)
Pt c/o medication issue:  1. Name of Medication: metolazone (ZAROXOLYN) 5 MG tablet  2. How are you currently taking this medication (dosage and times per day)? Only took 1 tablet 30 minutes before bumex  3. Are you having a reaction (difficulty breathing--STAT)? no  4. What is your medication issue? Patient states he was told to take 1 metolazone prior to taking his bumex. He states he was also told to double up his bumex. He states he only took 1 tablet of the metolazone Thursday night. He states Friday in morning he went to the bathroom "super many times" and stated "Saturday couldn't hardly move". He states he had cramps under his arms, face, and legs. He states his ribs felt like they were hit with 2x4's. He states he stopped his potassium and will hold it until his body gets straightened out. He states he thinks it dropped his potassium too low Friday. He states he is not having symptoms today. He says he will not take the bumex or the metolazone until his leg starts swelling again.    Patient also has a question about his short term disability. He states during his appointment the nurse came in and asked him all these questions. He states she put everything was negative. He wants to know if this will hurt him when disability sees it. He states they put negative for everything, but it should have been positive.

## 2020-09-29 NOTE — Telephone Encounter (Signed)
Spoke with patient about his symptoms, he states increasing his Bumex make him feel so bad. He stopped taking the Metolazone after the first dose because he was having cramps and just felt awful. He states he didn't take his Bumex over the weekend due to not feeling well. Since then he has had 3 doses of Bumex 2 mg. He states he is feeling better but not still 100% today. He still has a headache, "but I don't think it's from this, I think it's from all this other mess going on with my head" He states he is having a little swelling today.

## 2020-10-01 ENCOUNTER — Telehealth: Payer: Self-pay

## 2020-10-01 ENCOUNTER — Encounter: Payer: Self-pay | Admitting: Nurse Practitioner

## 2020-10-01 ENCOUNTER — Telehealth: Payer: Self-pay | Admitting: Cardiology

## 2020-10-01 ENCOUNTER — Telehealth (INDEPENDENT_AMBULATORY_CARE_PROVIDER_SITE_OTHER): Payer: Commercial Managed Care - PPO | Admitting: Nurse Practitioner

## 2020-10-01 VITALS — Ht 64.0 in | Wt 309.0 lb

## 2020-10-01 DIAGNOSIS — J029 Acute pharyngitis, unspecified: Secondary | ICD-10-CM

## 2020-10-01 DIAGNOSIS — R0609 Other forms of dyspnea: Secondary | ICD-10-CM

## 2020-10-01 DIAGNOSIS — R0600 Dyspnea, unspecified: Secondary | ICD-10-CM

## 2020-10-01 NOTE — Telephone Encounter (Signed)
Called patient to check on him. He states he was tested for Covid at his providers office and it was negative. He states "I don't know who to ask about this but does Dr. Servando Salina prescribe oxygen?" Patient informed he would have to reach out to his PCP or a pulmonologist to be prescribed oxygen. He thanked me for returning their call. No further questions or concerns expressed at this time.

## 2020-10-01 NOTE — Telephone Encounter (Signed)
Spoke with patient, see chart.    

## 2020-10-01 NOTE — Progress Notes (Signed)
Virtual Visit via Video Note   This visit type was conducted due to national recommendations for restrictions regarding the COVID-19 Pandemic (e.g. social distancing) in an effort to limit this patient's exposure and mitigate transmission in our community.  Due to his co-morbid illnesses, this patient is at least at moderate risk for complications without adequate follow up.  This format is felt to be most appropriate for this patient at this time.  All issues noted in this document were discussed and addressed.  A limited physical exam was performed with this format.  A verbal consent was obtained for the virtual visit.   Date:  10/01/2020   ID:  Randall Rojas, DOB 13-Mar-1961, MRN [id number]  Patient Location: Home Provider Location: Home Office  PCP:  Marianne Sofia, PA-C   Evaluation Performed:  Established patient, acute telemedicine visit  Chief Complaint:  sore throat  History of Present Illness:    Randall Rojas is a 60 y.o. male with sore throat and dyspnea. Onset was one day ago. Treatment has included throat lozenges. He has a past history of heart failure and asthma with chronic exertional dyspnea. He tells me that he experiences dyspnea during hot/humid days during the summer.  The patient does have symptoms concerning for COVID-19 infection (fever, chills, cough, or new shortness of breath).    Past Medical History:  Diagnosis Date   Asthma    Chest pain 04/15/2015   Coronary artery calcification seen on CT scan 04/15/2015   Coronary artery disease    Essential hypertension 04/15/2015   Hyperlipidemia 04/15/2015   Morbid obesity (HCC) 04/15/2015   TIA (transient ischemic attack)     Past Surgical History:  Procedure Laterality Date   CARDIAC CATHETERIZATION N/A 04/22/2015   Procedure: Left Heart Cath and Coronary Angiography;  Surgeon: Peter M [place], MD;  Location: [facility];  Service: Cardiovascular;  Laterality: N/A;   GALLBLADDER SURGERY     SINUS SURGERY  WITH INSTATRAK      Family History  Problem Relation Age of Onset   Hypertension Mother    Rheum arthritis Mother    Cancer Father    Heart attack Father    Cancer Brother    Hypertension Sister     Social History   Socioeconomic History   Marital status: Married    Spouse name: Not on file   Number of children: Not on file   Years of education: Not on file   Highest education level: Not on file  Occupational History   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  Tobacco Use   Smoking status: Former    Pack years: 0.00    Types: Cigarettes    Quit date: 04/13/1989    Years since quitting: 31.4   Smokeless tobacco: Never  Substance and Sexual Activity   Alcohol use: Not on file   Drug use: Not on file   Sexual activity: Not on file  Other Topics Concern   Not on file  Social History Narrative   Epworth Sleepiness Scale = 7 (as of 1/18/207)   Social Determinants of Health   Financial Resource Strain: Not on file  Food Insecurity: Not on file  Transportation Needs: Not on file  Physical Activity: Not on file  Stress: Not on file  Social Connections: Not on file  Intimate Partner Violence: Not on file    Outpatient Medications Prior to Visit  Medication Sig Dispense Refill   albuterol (VENTOLIN HFA) 108 (90 Base) MCG/ACT inhaler Inhale  2 puffs into the lungs every 6 (six) hours as needed for wheezing or shortness of breath. 8 g 0   ASPIRIN 81 PO Take 1 tablet by mouth daily.     atorvastatin (LIPITOR) 80 MG tablet Take 1 tablet by mouth daily.     baclofen (LIORESAL) 10 MG tablet Take 1-2 tablets by mouth every 6 (six) hours as needed for hallucinations.     bumetanide (BUMEX) 2 MG tablet Take 1 tablet (2 mg total) by mouth 2 (two) times daily. 180 tablet 3   busPIRone (BUSPAR) 5 MG tablet Take 5 mg by mouth 3 (three) times daily.     clopidogrel (PLAVIX) 75 MG tablet Take 1 tablet by mouth daily.     cyanocobalamin 1000 MCG tablet Take 1 tablet by mouth daily.     diclofenac  (VOLTAREN) 75 MG EC tablet Take 75 mg by mouth 2 (two) times daily.     levETIRAcetam (KEPPRA) 750 MG tablet Take 1 tablet by mouth in the morning and at bedtime.     metolazone (ZAROXOLYN) 5 MG tablet Take 30 minutes before first Bumex for 3 days. 3 tablet 0   nitroGLYCERIN (NITROSTAT) 0.4 MG SL tablet Place 1 tablet (0.4 mg total) under the tongue every 5 (five) minutes as needed for chest pain. 90 tablet 3   potassium chloride SA (KLOR-CON) 20 MEQ tablet Take 1 tablet (20 mEq total) by mouth 2 (two) times daily. 180 tablet 3   ranolazine (RANEXA) 1000 MG SR tablet Take 1 tablet (1,000 mg total) by mouth 2 (two) times daily. 180 tablet 3   No facility-administered medications prior to visit.    Allergies:   Cephalexin   Social History   Tobacco Use   Smoking status: Former    Pack years: 0.00    Types: Cigarettes    Quit date: 04/13/1989    Years since quitting: 31.4   Smokeless tobacco: Never     Review of Systems  Constitutional:  Negative for chills, fever and malaise/fatigue.  HENT:  Positive for sore throat.   Eyes: Negative.   Respiratory:  Positive for shortness of breath (chronic). Negative for cough.   Cardiovascular:  Negative for chest pain and palpitations.  Gastrointestinal: Negative.   Genitourinary: Negative.   Musculoskeletal: Negative.   Endo/Heme/Allergies: Negative.   Psychiatric/Behavioral: Negative.      Labs/Other Tests and Data Reviewed:    Recent Labs: 08/25/2020: Magnesium 2.1 09/24/2020: ALT 24; BUN 17; Creatinine, Ser 1.13; Hemoglobin 15.0; Platelets 163; Potassium 4.7; Sodium 144; TSH 2.040   Recent Lipid Panel Lab Results  Component Value Date/Time   CHOL 105 09/24/2020 09:26 AM   TRIG 110 09/24/2020 09:26 AM   HDL 32 (L) 09/24/2020 09:26 AM   CHOLHDL 3.3 09/24/2020 09:26 AM   CHOLHDL 6.2 04/07/2019 03:34 AM   LDLCALC 53 09/24/2020 09:26 AM    Wt Readings from Last 3 Encounters:  10/01/20 (!) 309 lb (140.2 kg)  09/24/20 (!) 309 lb 12.8  oz (140.5 kg)  09/24/20 (!) 307 lb (139.3 kg)     Objective:    Vital Signs:  Ht 5\' 4"  (1.626 m)   Wt (!) 309 lb (140.2 kg)   BMI 53.04 kg/m    Physical Exam No physical exam due to telemedicine visit  ASSESSMENT & PLAN:   1. Sore throat - POC COVID-19-Negative - POCT rapid strep A-Negative -Warm salt water gargles -Tylenol/Ibuprofen as directed  2. Exertional dyspnea -Continue medications as prescribed -Rest and stay indoors -  Seek emergency medical care for severe symptoms    Orders Placed This Encounter  Procedures   POC COVID-19   POCT rapid strep A       COVID-19 Education: The signs and symptoms of COVID-19 were discussed with the patient and how to seek care for testing (follow up with PCP or arrange E-visit). The importance of social distancing was discussed today.   I spent 10 minutes dedicated to the care of this patient on the date of this encounter to include face-to-face time with the patient, as well as: EMR.  Follow Up:  In Person prn  Signed, Janie Morning, NP  10/01/2020 10:27 AM    [facility]

## 2020-10-01 NOTE — Telephone Encounter (Signed)
Wife of patient called    Pt c/o Shortness Of Breath: STAT if SOB developed within the last 24 hours or pt is noticeably SOB on the phone  1. Are you currently SOB (can you hear that pt is SOB on the phone)? Wife is calling for the patient   2. How long have you been experiencing SOB? Has been more regular the last week or so since he saw Dr. Servando Salina   3. Are you SOB when sitting or when up moving around? All the time   4. Are you currently experiencing any other symptoms? Patient has sore throat. He took an at home COVID test and it is positive, so they are on the way to their PCP to get tested again.  The wife wanted to know if Dr. Servando Salina would order at home oxygen for him

## 2020-10-01 NOTE — Telephone Encounter (Signed)
I am sorry to about you being Covid positive. Unfortunately, you will need to work with your PCP or pulmonologist for home oxygen.

## 2020-10-08 ENCOUNTER — Other Ambulatory Visit: Payer: Self-pay

## 2020-10-08 ENCOUNTER — Ambulatory Visit (HOSPITAL_BASED_OUTPATIENT_CLINIC_OR_DEPARTMENT_OTHER): Payer: Commercial Managed Care - PPO | Admitting: Cardiovascular Disease

## 2020-10-18 ENCOUNTER — Telehealth: Payer: Self-pay | Admitting: Cardiology

## 2020-10-18 NOTE — Telephone Encounter (Signed)
Spoke to the patient just now and he let me know that he was put on Plavix and Aspirin back in April during his hospitilization. He is done taking the Plavix now but states that he was told to take Aspirin 325 mg daily once this was stopped. He states that he was told that he should not be taking Aspirin with Voltaren. He was told this by the orthopedic provider that he saw earlier today.   He states that if he does not take the Voltaren he can not move his fingers and his whole side will hurt.   He is wondering what he should do at this time and is wanting recommendations from Dr. Servando Salina.

## 2020-10-18 NOTE — Telephone Encounter (Signed)
Pt is calling with concerns that the medications he is on is counteracting with medications his PCP put him on

## 2020-10-19 NOTE — Telephone Encounter (Signed)
Spoke to the patient just now and let him know Dr. Munley's recommendations. He verbalizes understanding.  

## 2020-10-20 DIAGNOSIS — R569 Unspecified convulsions: Secondary | ICD-10-CM

## 2020-10-20 HISTORY — DX: Unspecified convulsions: R56.9

## 2020-10-25 ENCOUNTER — Telehealth: Payer: Self-pay | Admitting: Cardiology

## 2020-10-25 ENCOUNTER — Encounter (HOSPITAL_BASED_OUTPATIENT_CLINIC_OR_DEPARTMENT_OTHER): Payer: Self-pay

## 2020-10-25 ENCOUNTER — Ambulatory Visit (HOSPITAL_BASED_OUTPATIENT_CLINIC_OR_DEPARTMENT_OTHER): Payer: Commercial Managed Care - PPO | Admitting: Cardiovascular Disease

## 2020-10-25 NOTE — Telephone Encounter (Signed)
Patient was just d/c from [facility] last week. 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  He also wanted to make sure Dr. Servando Salina goes over his medications with him at his appointment tomorrow.

## 2020-10-26 ENCOUNTER — Encounter: Payer: Self-pay | Admitting: Cardiology

## 2020-10-26 ENCOUNTER — Other Ambulatory Visit: Payer: Self-pay

## 2020-10-26 ENCOUNTER — Ambulatory Visit (INDEPENDENT_AMBULATORY_CARE_PROVIDER_SITE_OTHER): Payer: Commercial Managed Care - PPO | Admitting: Cardiology

## 2020-10-26 VITALS — BP 126/88 | HR 76 | Ht 64.0 in | Wt 304.6 lb

## 2020-10-26 DIAGNOSIS — G459 Transient cerebral ischemic attack, unspecified: Secondary | ICD-10-CM | POA: Diagnosis not present

## 2020-10-26 DIAGNOSIS — E66813 Obesity, class 3: Secondary | ICD-10-CM

## 2020-10-26 DIAGNOSIS — I10 Essential (primary) hypertension: Secondary | ICD-10-CM

## 2020-10-26 DIAGNOSIS — I25118 Atherosclerotic heart disease of native coronary artery with other forms of angina pectoris: Secondary | ICD-10-CM | POA: Diagnosis not present

## 2020-10-26 DIAGNOSIS — E782 Mixed hyperlipidemia: Secondary | ICD-10-CM

## 2020-10-26 DIAGNOSIS — I2510 Atherosclerotic heart disease of native coronary artery without angina pectoris: Secondary | ICD-10-CM

## 2020-10-26 DIAGNOSIS — I5032 Chronic diastolic (congestive) heart failure: Secondary | ICD-10-CM

## 2020-10-26 MED ORDER — ROSUVASTATIN CALCIUM 10 MG PO TABS: 10.0000 mg | ORAL_TABLET | Freq: Every day | ORAL | 3 refills | Status: DC

## 2020-10-26 MED ORDER — RANOLAZINE ER 1000 MG PO TB12: 1000.0000 mg | ORAL_TABLET | Freq: Two times a day (BID) | ORAL | 3 refills | Status: DC

## 2020-10-26 NOTE — H&P (View-Only) (Signed)
Cardiology Office Note:    Date:  10/26/2020   ID:  Randall Rojas, DOB [birth year], MRN [id number]  PCP:  Marianne Sofia, PA-C  Cardiologist:  Thomasene Ripple, DO  Electrophysiologist:  None   Referring MD: Marianne Sofia, PA-C   Chief Complaint  Patient presents with   Chest Pain   Fatigue   non epilectic seizures    History of Present Illness:    Randall Rojas is a 60 y.o. male with a hx of coronary artery disease with recent nuclear stress test showing no evidence of ischemia, recent TIA the patient is on dual antiplatelet therapy, hypertension, hyperlipidemia, chronic diastolic heart failure with his recent EF in May 2022 55-60%.   I last saw the patient on Aug 05, 2020, and prior to that visit the patient reported that he had been admitted twice at the [facility] hospital first for concern for stroke.  Both visits there was concern for TIA.  The second visit which was on July 11, 2020 the patient has significant headache along with left-sided weakness and numbness.  He was then admitted to the [facility] hospital.  During that time he underwent multiple testing modalities his MRI demonstrated no acute cranial abnormalities, his CTA showed 50% stenosis of the right ICA and less than 50% stenosis of the left ICA.  The patient did see neurology while he was in the hospital he was started on dual antiplatelet therapy for 3 months and then will transition to aspirin only.  He is now following with neurology.  He was started on statins as well.   At the conclusion of his Aug 05, 2020 visit he reported that he was experiencing some fluttering at therefore place a monitor on the patient.  At that time he also appeared to be fluid overloaded we restarted his Lasix.   Since I last saw the patient he had been admitted at [facility] hospital with a headache concerns that the patient may be experiencing multiple episodes of seizure.  He is being worked up by neurology there.  He is  planning on undergoing a inpatient continuous EEG monitoring with [facility].   I saw the patient in August 25, 2020 at that time we started him from Lasix to Bumex and he did not have any angina.  He ended up at [facility] where he was treated for heart failure and did have some chest pain which started Imdur on the patient.  On July 1 I saw the patient posthospitalization from [facility] where he was started on Imdur for chest pain.  He had had some rash on the Imdur so I stopped that and have the patient take ranolazine.  Since I last saw the patient he was hospitalized he had a inpatient stay at [facility] undergoing testing for for 3-day work-up for suspected seizures versus strokes.  He was told that he has many seizures but no epileptic seizures.  Today he tells me he still is experiencing significant chest pain along with shortness of breath.  It has not improved any.  He is concerned about this.  He is here with his wife.  He described the pain as a midsternal pressure-like sensation.  It comes on and off without any decimating factors.  He is not sure how long it lasts but he thinks that sometimes 20 minutes.  No other complaints at this time.   Past Medical History:  Diagnosis Date   Asthma  Chest pain 04/15/2015   Coronary artery calcification seen on CT scan 04/15/2015   Coronary artery disease    Essential hypertension 04/15/2015   Hyperlipidemia 04/15/2015   Morbid obesity (HCC) 04/15/2015   TIA (transient ischemic attack)     Past Surgical History:  Procedure Laterality Date   CARDIAC CATHETERIZATION N/A 04/22/2015   Procedure: Left Heart Cath and Coronary Angiography;  Surgeon: Peter M Swaziland, MD;  Location: [facility] INVASIVE CV LAB;  Service: Cardiovascular;  Laterality: N/A;   GALLBLADDER SURGERY     SINUS SURGERY WITH INSTATRAK      Current Medications: Current Meds  Medication Sig   albuterol (VENTOLIN HFA) 108 (90 Base) MCG/ACT  inhaler Inhale 2 puffs into the lungs every 6 (six) hours as needed for wheezing or shortness of breath.   aspirin 81 MG EC tablet Take 4 tablets by mouth daily.   atorvastatin (LIPITOR) 80 MG tablet Take 1 tablet by mouth daily.   baclofen (LIORESAL) 10 MG tablet Take 1-2 tablets by mouth every 6 (six) hours as needed for hallucinations.   bumetanide (BUMEX) 2 MG tablet Take 1 tablet (2 mg total) by mouth 2 (two) times daily.   busPIRone (BUSPAR) 5 MG tablet Take 5 mg by mouth 3 (three) times daily.   cyanocobalamin 1000 MCG tablet Take 1 tablet by mouth daily.   levETIRAcetam (KEPPRA) 750 MG tablet Take 1 tablet by mouth in the morning and at bedtime.   nitroGLYCERIN (NITROSTAT) 0.4 MG SL tablet Place 1 tablet (0.4 mg total) under the tongue every 5 (five) minutes as needed for chest pain.   potassium chloride SA (KLOR-CON) 20 MEQ tablet Take 1 tablet (20 mEq total) by mouth 2 (two) times daily.   rosuvastatin (CRESTOR) 10 MG tablet Take 1 tablet (10 mg total) by mouth daily.     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  Tobacco Use   Smoking status: Former    Types: Cigarettes    Quit date: 04/13/1989    Years since quitting: 31.5   Smokeless tobacco: Never  Substance and Sexual Activity   Alcohol use: Not on file   Drug use: Not on file   Sexual activity: Not on file  Other Topics Concern   Not on file  Social History Narrative   Epworth Sleepiness Scale = 7 (as of 1/18/207)   Social Determinants of Health   Financial Resource Strain: Not on file  Food Insecurity: Not on file  Transportation Needs: Not on file  Physical Activity: Not on file  Stress: Not on file  Social Connections: Not on file     Family  History: The patient's family history includes Cancer in his brother and father; Heart attack in his father; Hypertension in his mother and sister; Rheum arthritis in his mother.  ROS:   Review of Systems  Constitution: Negative for decreased appetite, fever and weight gain.  HENT: Negative for congestion, ear discharge, hoarse voice and sore throat.   Eyes: Negative for discharge, redness, vision loss in right eye and visual halos.  Cardiovascular: Negative for chest pain, dyspnea on exertion, leg swelling, orthopnea and palpitations.  Respiratory: Negative for cough, hemoptysis, shortness of breath and  snoring.   Endocrine: Negative for heat intolerance and polyphagia.  Hematologic/Lymphatic: Negative for bleeding problem. Does not bruise/bleed easily.  Skin: Negative for flushing, nail changes, rash and suspicious lesions.  Musculoskeletal: Negative for arthritis, joint pain, muscle cramps, myalgias, neck pain and stiffness.  Gastrointestinal: Negative for abdominal pain, bowel incontinence, diarrhea and excessive appetite.  Genitourinary: Negative for decreased libido, genital sores and incomplete emptying.  Neurological: Negative for brief paralysis, focal weakness, headaches and loss of balance.  Psychiatric/Behavioral: Negative for altered mental status, depression and suicidal ideas.  Allergic/Immunologic: Negative for HIV exposure and persistent infections.    EKGs/Labs/Other Studies Reviewed:    The following studies were reviewed today:   EKG:  The ekg ordered today demonstrates sinus bradycardia, heart rate 59 bpm.  Recent Labs: 08/25/2020: Magnesium 2.1 09/24/2020: ALT 24; BUN 17; Creatinine, Ser 1.13; Hemoglobin 15.0; Platelets 163; Potassium 4.7; Sodium 144; TSH 2.040  Recent Lipid Panel    Component Value Date/Time   CHOL 105 09/24/2020 0926   TRIG 110 09/24/2020 0926   HDL 32 (L) 09/24/2020 0926   CHOLHDL 3.3 09/24/2020 0926   CHOLHDL 6.2 04/07/2019 0334   VLDL 16  04/07/2019 0334   LDLCALC 53 09/24/2020 0926    Physical Exam:    VS:  BP 126/88 (BP Location: Left Arm, Patient Position: Sitting)   Pulse 76   Ht 5\' 4"  (1.626 m)   Wt (!) 304 lb 9.6 oz (138.2 kg)   SpO2 91%   BMI 52.28 kg/m     Wt Readings from Last 3 Encounters:  10/26/20 (!) 304 lb 9.6 oz (138.2 kg)  10/01/20 (!) 309 lb (140.2 kg)  09/24/20 (!) 309 lb 12.8 oz (140.5 kg)     GEN: Well nourished, well developed in no acute distress HEENT: Normal NECK: No JVD; No carotid bruits LYMPHATICS: No lymphadenopathy CARDIAC: S1S2 noted,RRR, no murmurs, rubs, gallops RESPIRATORY:  Clear to auscultation without rales, wheezing or rhonchi  ABDOMEN: Soft, non-tender, non-distended, +bowel sounds, no guarding. EXTREMITIES: No edema, No cyanosis, no clubbing MUSCULOSKELETAL:  No deformity  SKIN: Warm and dry NEUROLOGIC:  Alert and oriented x 3, non-focal PSYCHIATRIC:  Normal affect, good insight  ASSESSMENT:    1. Coronary artery disease of native artery of native heart with stable angina pectoris (HCC)   2. Essential hypertension   3. TIA (transient ischemic attack)   4. Coronary artery disease involving native coronary artery of native heart, unspecified whether angina present   5. Coronary artery disease involving native coronary artery of native heart without angina pectoris   6. Chronic heart failure with preserved ejection fraction (HCC)   7. Primary hypertension   8. Mixed hyperlipidemia   9. Obesity, Class III, BMI 40-49.9 (morbid obesity) (HCC)    PLAN:     Despite his negative stress test and antianginals the patient has continued to experience chest discomfort.  With this I am going to move forward with a right and left heart catheterization in this patient to make sure that his stress testing was not a false negative especially with his history of coronary artery disease.  He is on aspirin we will continue this for now.  Add Crestor 10 mg daily to his regimen.  Continue  his Ranexa.  It appears his seizures has been controlled on his Keppra.  He does have significant bilateral leg edema he had not been taking his Bumex as prescribed since he was in the hospital he reports that recently he started back but he  missed a couple days.  I have urged patient to restart his Bumex 2 mg twice a day.  He is here today with his wife he has a short-term disability form which needs to be filled out.  At this time I am okay with filling this form out as the patient does need further cardiovascular testing for his left heart catheterization.  We will have his phone form completed for 6 weeks until he get his testing.  They inquired about long-term disability I did speak to the patient and his wife deferring to his PCP as it would be best to discuss with her about his long-term disability given the fact that the patient has chronic seizures as well and other comorbidities that are not cardiovascular in nature.  The patient understands the need to lose weight with diet and exercise. We have discussed specific strategies for this.  The patient is in agreement with the above plan. The patient left the office in stable condition.  The patient will follow up in 6 weeks or sooner if needed.   Medication Adjustments/Labs and Tests Ordered: Current medicines are reviewed at length with the patient today.  Concerns regarding medicines are outlined above.  Orders Placed This Encounter  Procedures   Basic metabolic panel   CBC   EKG 12-Lead   Meds ordered this encounter  Medications   ranolazine (RANEXA) 1000 MG SR tablet    Sig: Take 1 tablet (1,000 mg total) by mouth 2 (two) times daily.    Dispense:  180 tablet    Refill:  3   rosuvastatin (CRESTOR) 10 MG tablet    Sig: Take 1 tablet (10 mg total) by mouth daily.    Dispense:  90 tablet    Refill:  3    Patient Instructions  Medication Instructions:  Your physician has recommended you make the following change in your  medication:  START: Rosuvastatin 10 mg take one tablet by mouth daily.   *If you need a refill on your cardiac medications before your next appointment, please call your pharmacy*   Lab Work: Your physician recommends that you return for lab work in: TODAY CBC, BMP If you have labs (blood work) drawn today and your tests are completely normal, you will receive your results only by: MyChart Message (if you have MyChart) OR A paper copy in the mail If you have any lab test that is abnormal or we need to change your treatment, we will call you to review the results.   Testing/Procedures:  [facility] CARDIOVASCULAR DIVISION [facility] AT  [address] [place] [address] Dept: [phone] Loc: [phone]  Randall Rojas  10/26/2020  You are scheduled for a Cardiac Catheterization on Thursday, August 4 with Dr. Nicki Guadalajara.  1. Please arrive at the [facility] (Main Entrance A) at [facility]: [address] [place], [place] [address] at 5:30 AM (This time is two hours before your procedure to ensure your preparation). Free valet parking service is available.   Special note: Every effort is made to have your procedure done on time. Please understand that emergencies sometimes delay scheduled procedures.  2. Diet: Do not eat solid foods after midnight.  The patient may have clear liquids until 5am upon the day of the procedure.  3. Labs: YOU HAD YOUR LABS DRAWN TOADY 10/26/2020  4. Medication instructions in preparation for your procedure:   Contrast Allergy: No  On the morning of your procedure,  take your Aspirin and any morning medicines NOT listed above.  You may use sips of water.  5. Plan for one night stay--bring personal belongings. 6. Bring a current list of your medications and current insurance cards. 7. You MUST have a responsible person to drive you home. 8. Someone MUST be with you the first 24 hours after you  arrive home or your discharge will be delayed. 9. Please wear clothes that are easy to get on and off and wear slip-on shoes.  Thank you for allowing us to care for you!   -- [facility]    Follow-Up: At [facility], you and your health needs are our priority.  As part of our continuing mission to provide you with exceptional heart care, we have created designated Provider Care Teams.  These Care Teams include your primary Cardiologist (physician) and Advanced Practice Providers (APPs -  Physician Assistants and Nurse Practitioners) who all work together to provide you with the care you need, when you need it.  We recommend signing up for the patient portal called "MyChart".  Sign up information is provided on this After Visit Summary.  MyChart is used to connect with patients for Virtual Visits (Telemedicine).  Patients are able to view lab/test results, encounter notes, upcoming appointments, etc.  Non-urgent messages can be sent to your provider as well.   To learn more about what you can do with MyChart, go to [email or web address].    Your next appointment:   6 week(s)  The format for your next appointment:   In Person  Provider:   Thomasene RippleKardie Sahasra Belue, DO   Other Instructions    Adopting a Healthy Lifestyle.  Know what a healthy weight is for you (roughly BMI <25) and aim to maintain this   Aim for 7+ servings of fruits and vegetables daily   65-80+ fluid ounces of water or unsweet tea for healthy kidneys   Limit to max 1 drink of alcohol per day; avoid smoking/tobacco   Limit animal fats in diet for cholesterol and heart health - choose grass fed whenever available   Avoid highly processed foods, and foods high in saturated/trans fats   Aim for low stress - take time to unwind and care for your mental health   Aim for 150 min of moderate intensity exercise weekly for heart health, and weights twice weekly for bone health   Aim for 7-9  hours of sleep daily   When it comes to diets, agreement about the perfect plan isnt easy to find, even among the experts. Experts at the [employer] developed an idea known as the Healthy Eating Plate. Just imagine a plate divided into logical, healthy portions.   The emphasis is on diet quality:   Load up on vegetables and fruits - one-half of your plate: Aim for color and variety, and remember that potatoes dont count.   Go for whole grains - one-quarter of your plate: Whole wheat, barley, wheat berries, quinoa, oats, brown rice, and foods made with them. If you want pasta, go with whole wheat pasta.   Protein power - one-quarter of your plate: Fish, chicken, beans, and nuts are all healthy, versatile protein sources. Limit red meat.   The diet, however, does go beyond the plate, offering a few other suggestions.   Use healthy plant oils, such as olive, canola, soy, corn, sunflower and peanut. Check the labels, and avoid partially hydrogenated oil, which have unhealthy trans fats.  If youre thirsty, drink water. Coffee and tea are good in moderation, but skip sugary drinks and limit milk and dairy products to one or two daily servings.   The type of carbohydrate in the diet is more important than the amount. Some sources of carbohydrates, such as vegetables, fruits, whole grains, and beans-are healthier than others.   Finally, stay active  Signed, Thomasene Ripple, DO  10/26/2020 3:53 PM    [facility]

## 2020-10-26 NOTE — Addendum Note (Signed)
Addended by: Thomasene Ripple on: 10/26/2020 03:54 PM   Modules accepted: Orders, SmartSet

## 2020-10-26 NOTE — Progress Notes (Signed)
Cardiology Office Note:    Date:  10/26/2020   ID:  Randall Rojas, DOB 08/20/1960, MRN [id number]  PCP:  Davis, Sara, PA-C  Cardiologist:  Sedric Guia, DO  Electrophysiologist:  None   Referring MD: Davis, Sara, PA-C   Chief Complaint  Patient presents with   Chest Pain   Fatigue   non epilectic seizures    History of Present Illness:    Randall Rojas is a 60 y.o. male with a hx of coronary artery disease with recent nuclear stress test showing no evidence of ischemia, recent TIA the patient is on dual antiplatelet therapy, hypertension, hyperlipidemia, chronic diastolic heart failure with his recent EF in May 2022 55-60%.   I last saw the patient on Aug 05, 2020, and prior to that visit the patient reported that he had been admitted twice at the [facility] first for concern for stroke.  Both visits there was concern for TIA.  The second visit which was on July 11, 2020 the patient has significant headache along with left-sided weakness and numbness.  He was then admitted to the [facility].  During that time he underwent multiple testing modalities his MRI demonstrated no acute cranial abnormalities, his CTA showed 50% stenosis of the right ICA and less than 50% stenosis of the left ICA.  The patient did see neurology while he was in the hospital he was started on dual antiplatelet therapy for 3 months and then will transition to aspirin only.  He is now following with neurology.  He was started on statins as well.   At the conclusion of his Aug 05, 2020 visit he reported that he was experiencing some fluttering at therefore place a monitor on the patient.  At that time he also appeared to be fluid overloaded we restarted his Lasix.   Since I last saw the patient he had been admitted at [facility] with a headache concerns that the patient may be experiencing multiple episodes of seizure.  He is being worked up by neurology there.  He is  planning on undergoing a inpatient continuous EEG monitoring with [facility].   I saw the patient in August 25, 2020 at that time we started him from Lasix to Bumex and he did not have any angina.  He ended up at [facility] where he was treated for heart failure and did have some chest pain which started Imdur on the patient.  On July 1 I saw the patient posthospitalization from [facility] where he was started on Imdur for chest pain.  He had had some rash on the Imdur so I stopped that and have the patient take ranolazine.  Since I last saw the patient he was hospitalized he had a inpatient stay at [facility] hospital undergoing testing for for 3-day work-up for suspected seizures versus strokes.  He was told that he has many seizures but no epileptic seizures.  Today he tells me he still is experiencing significant chest pain along with shortness of breath.  It has not improved any.  He is concerned about this.  He is here with his wife.  He described the pain as a midsternal pressure-like sensation.  It comes on and off without any decimating factors.  He is not sure how long it lasts but he thinks that sometimes 20 minutes.  No other complaints at this time.   Past Medical History:  Diagnosis Date   Asthma      Chest pain 04/15/2015   Coronary artery calcification seen on CT scan 04/15/2015   Coronary artery disease    Essential hypertension 04/15/2015   Hyperlipidemia 04/15/2015   Morbid obesity (HCC) 04/15/2015   TIA (transient ischemic attack)     Past Surgical History:  Procedure Laterality Date   CARDIAC CATHETERIZATION N/A 04/22/2015   Procedure: Left Heart Cath and Coronary Angiography;  Surgeon: Peter M Jordan, MD;  Location: [facility] INVASIVE [facility];  Service: Cardiovascular;  Laterality: N/A;   GALLBLADDER SURGERY     SINUS SURGERY WITH INSTATRAK      Current Medications: Current Meds  Medication Sig   albuterol (VENTOLIN HFA) 108 (90 Base) MCG/ACT  inhaler Inhale 2 puffs into the lungs every 6 (six) hours as needed for wheezing or shortness of breath.   aspirin 81 MG EC tablet Take 4 tablets by mouth daily.   atorvastatin (LIPITOR) 80 MG tablet Take 1 tablet by mouth daily.   baclofen (LIORESAL) 10 MG tablet Take 1-2 tablets by mouth every 6 (six) hours as needed for hallucinations.   bumetanide (BUMEX) 2 MG tablet Take 1 tablet (2 mg total) by mouth 2 (two) times daily.   busPIRone (BUSPAR) 5 MG tablet Take 5 mg by mouth 3 (three) times daily.   cyanocobalamin 1000 MCG tablet Take 1 tablet by mouth daily.   levETIRAcetam (KEPPRA) 750 MG tablet Take 1 tablet by mouth in the morning and at bedtime.   nitroGLYCERIN (NITROSTAT) 0.4 MG SL tablet Place 1 tablet (0.4 mg total) under the tongue every 5 (five) minutes as needed for chest pain.   potassium chloride SA (KLOR-CON) 20 MEQ tablet Take 1 tablet (20 mEq total) by mouth 2 (two) times daily.   rosuvastatin (CRESTOR) 10 MG tablet Take 1 tablet (10 mg total) by mouth daily.     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  Tobacco Use   Smoking status: Former    Types: Cigarettes    Quit date: 04/13/1989    Years since quitting: 31.5   Smokeless tobacco: Never  Substance and Sexual Activity   Alcohol use: Not on file   Drug use: Not on file   Sexual activity: Not on file  Other Topics Concern   Not on file  Social History Narrative   Epworth Sleepiness Scale = 7 (as of 1/18/207)   Social Determinants of Health   Financial Resource Strain: Not on file  Food Insecurity: Not on file  Transportation Needs: Not on file  Physical Activity: Not on file  Stress: Not on file  Social Connections: Not on file     Family  History: The patient's family history includes Cancer in his brother and father; Heart attack in his father; Hypertension in his mother and sister; Rheum arthritis in his mother.  ROS:   Review of Systems  Constitution: Negative for decreased appetite, fever and weight gain.  HENT: Negative for congestion, ear discharge, hoarse voice and sore throat.   Eyes: Negative for discharge, redness, vision loss in right eye and visual halos.  Cardiovascular: Negative for chest pain, dyspnea on exertion, leg swelling, orthopnea and palpitations.  Respiratory: Negative for cough, hemoptysis, shortness of breath and   snoring.   Endocrine: Negative for heat intolerance and polyphagia.  Hematologic/Lymphatic: Negative for bleeding problem. Does not bruise/bleed easily.  Skin: Negative for flushing, nail changes, rash and suspicious lesions.  Musculoskeletal: Negative for arthritis, joint pain, muscle cramps, myalgias, neck pain and stiffness.  Gastrointestinal: Negative for abdominal pain, bowel incontinence, diarrhea and excessive appetite.  Genitourinary: Negative for decreased libido, genital sores and incomplete emptying.  Neurological: Negative for brief paralysis, focal weakness, headaches and loss of balance.  Psychiatric/Behavioral: Negative for altered mental status, depression and suicidal ideas.  Allergic/Immunologic: Negative for HIV exposure and persistent infections.    EKGs/Labs/Other Studies Reviewed:    The following studies were reviewed today:   EKG:  The ekg ordered today demonstrates sinus bradycardia, heart rate 59 bpm.  Recent Labs: 08/25/2020: Magnesium 2.1 09/24/2020: ALT 24; BUN 17; Creatinine, Ser 1.13; Hemoglobin 15.0; Platelets 163; Potassium 4.7; Sodium 144; TSH 2.040  Recent Lipid Panel    Component Value Date/Time   CHOL 105 09/24/2020 0926   TRIG 110 09/24/2020 0926   HDL 32 (L) 09/24/2020 0926   CHOLHDL 3.3 09/24/2020 0926   CHOLHDL 6.2 04/07/2019 0334   VLDL 16  04/07/2019 0334   LDLCALC 53 09/24/2020 0926    Physical Exam:    VS:  BP 126/88 (BP Location: Left Arm, Patient Position: Sitting)   Pulse 76   Ht 5' 4" (1.626 m)   Wt (!) 304 lb 9.6 oz (138.2 kg)   SpO2 91%   BMI 52.28 kg/m     Wt Readings from Last 3 Encounters:  10/26/20 (!) 304 lb 9.6 oz (138.2 kg)  10/01/20 (!) 309 lb (140.2 kg)  09/24/20 (!) 309 lb 12.8 oz (140.5 kg)     GEN: Well nourished, well developed in no acute distress HEENT: Normal NECK: No JVD; No carotid bruits LYMPHATICS: No lymphadenopathy CARDIAC: S1S2 noted,RRR, no murmurs, rubs, gallops RESPIRATORY:  Clear to auscultation without rales, wheezing or rhonchi  ABDOMEN: Soft, non-tender, non-distended, +bowel sounds, no guarding. EXTREMITIES: No edema, No cyanosis, no clubbing MUSCULOSKELETAL:  No deformity  SKIN: Warm and dry NEUROLOGIC:  Alert and oriented x 3, non-focal PSYCHIATRIC:  Normal affect, good insight  ASSESSMENT:    1. Coronary artery disease of native artery of native heart with stable angina pectoris (HCC)   2. Essential hypertension   3. TIA (transient ischemic attack)   4. Coronary artery disease involving native coronary artery of native heart, unspecified whether angina present   5. Coronary artery disease involving native coronary artery of native heart without angina pectoris   6. Chronic heart failure with preserved ejection fraction (HCC)   7. Primary hypertension   8. Mixed hyperlipidemia   9. Obesity, Class III, BMI 40-49.9 (morbid obesity) (HCC)    PLAN:     Despite his negative stress test and antianginals the patient has continued to experience chest discomfort.  With this I am going to move forward with a right and left heart catheterization in this patient to make sure that his stress testing was not a false negative especially with his history of coronary artery disease.  He is on aspirin we will continue this for now.  Add Crestor 10 mg daily to his regimen.  Continue  his Ranexa.  It appears his seizures has been controlled on his Keppra.  He does have significant bilateral leg edema he had not been taking his Bumex as prescribed since he was in the hospital he reports that recently he started back but he   missed a couple days.  I have urged patient to restart his Bumex 2 mg twice a day.  He is here today with his wife he has a short-term disability form which needs to be filled out.  At this time I am okay with filling this form out as the patient does need further cardiovascular testing for his left heart catheterization.  We will have his phone form completed for 6 weeks until he get his testing.  They inquired about long-term disability I did speak to the patient and his wife deferring to his PCP as it would be best to discuss with her about his long-term disability given the fact that the patient has chronic seizures as well and other comorbidities that are not cardiovascular in nature.  The patient understands the need to lose weight with diet and exercise. We have discussed specific strategies for this.  The patient is in agreement with the above plan. The patient left the office in stable condition.  The patient will follow up in 6 weeks or sooner if needed.   Medication Adjustments/Labs and Tests Ordered: Current medicines are reviewed at length with the patient today.  Concerns regarding medicines are outlined above.  Orders Placed This Encounter  Procedures   Basic metabolic panel   CBC   EKG 12-Lead   Meds ordered this encounter  Medications   ranolazine (RANEXA) 1000 MG SR tablet    Sig: Take 1 tablet (1,000 mg total) by mouth 2 (two) times daily.    Dispense:  180 tablet    Refill:  3   rosuvastatin (CRESTOR) 10 MG tablet    Sig: Take 1 tablet (10 mg total) by mouth daily.    Dispense:  90 tablet    Refill:  3    Patient Instructions  Medication Instructions:  Your physician has recommended you make the following change in your  medication:  START: Rosuvastatin 10 mg take one tablet by mouth daily.   *If you need a refill on your cardiac medications before your next appointment, please call your pharmacy*   Lab Work: Your physician recommends that you return for lab work in: TODAY CBC, BMP If you have labs (blood work) drawn today and your tests are completely normal, you will receive your results only by: MyChart Message (if you have MyChart) OR A paper copy in the mail If you have any lab test that is abnormal or we need to change your treatment, we will call you to review the results.   Testing/Procedures:  [facility] CARD[facility]ASCULAR DIV[facility] [place] [address] [place] [address] Dept: [phone] Loc: [phone]  Derold D Beverlin  10/26/2020  You are scheduled for a Cardiac Catheterization on Thursday, August 4 with Dr. Thomas Kelly.  1. Please arrive at the [facility] (Main Entrance A) at [facility]: [address] [place], [place] [address] at 5:30 AM (This time is two hours before your procedure to ensure your preparation). Free valet parking service is available.   Special note: Every effort is made to have your procedure done on time. Please understand that emergencies sometimes delay scheduled procedures.  2. Diet: Do not eat solid foods after midnight.  The patient may have clear liquids until 5am upon the day of the procedure.  3. Labs: YOU HAD YOUR LABS DRAWN TOADY 10/26/2020  4. Medication instructions in preparation for your procedure:   Contrast Allergy: No  On the morning of your procedure,   take your Aspirin and any morning medicines NOT listed above.  You may use sips of water.  5. Plan for one night stay--bring personal belongings. 6. Bring a current list of your medications and current insurance cards. 7. You MUST have a responsible person to drive you home. 8. Someone MUST be with you the first 24 hours after you  arrive home or your discharge will be delayed. 9. Please wear clothes that are easy to get on and off and wear slip-on shoes.  Thank you for allowing us to care for you!   -- [place] [facility]    Follow-Up: At [facility], you and your health needs are our priority.  As part of our continuing mission to provide you with exceptional heart care, we have created designated Provider Care Teams.  These Care Teams include your primary Cardiologist (physician) and Advanced Practice Providers (APPs -  Physician Assistants and Nurse Practitioners) who all work together to provide you with the care you need, when you need it.  We recommend signing up for the patient portal called "MyChart".  Sign up information is provided on this After Visit Summary.  MyChart is used to connect with patients for Virtual Visits (Telemedicine).  Patients are able to view lab/test results, encounter notes, upcoming appointments, etc.  Non-urgent messages can be sent to your provider as well.   To learn more about what you can do with MyChart, go to [email or web address].    Your next appointment:   6 week(s)  The format for your next appointment:   In Person  Provider:   Jazlyne Gauger, DO   Other Instructions    Adopting a Healthy Lifestyle.  Know what a healthy weight is for you (roughly BMI <25) and aim to maintain this   Aim for 7+ servings of fruits and vegetables daily   65-80+ fluid ounces of water or unsweet tea for healthy kidneys   Limit to max 1 drink of alcohol per day; avoid smoking/tobacco   Limit animal fats in diet for cholesterol and heart health - choose grass fed whenever available   Avoid highly processed foods, and foods high in saturated/trans fats   Aim for low stress - take time to unwind and care for your mental health   Aim for 150 min of moderate intensity exercise weekly for heart health, and weights twice weekly for bone health   Aim for 7-9  hours of sleep daily   When it comes to diets, agreement about the perfect plan isnt easy to find, even among the experts. Experts at the [employer] developed an idea known as the Healthy Eating Plate. Just imagine a plate divided into logical, healthy portions.   The emphasis is on diet quality:   Load up on vegetables and fruits - one-half of your plate: Aim for color and variety, and remember that potatoes dont count.   Go for whole grains - one-quarter of your plate: Whole wheat, barley, wheat berries, quinoa, oats, brown rice, and foods made with them. If you want pasta, go with whole wheat pasta.   Protein power - one-quarter of your plate: Fish, chicken, beans, and nuts are all healthy, versatile protein sources. Limit red meat.   The diet, however, does go beyond the plate, offering a few other suggestions.   Use healthy plant oils, such as olive, canola, soy, corn, sunflower and peanut. Check the labels, and avoid partially hydrogenated oil, which have unhealthy trans fats.     If youre thirsty, drink water. Coffee and tea are good in moderation, but skip sugary drinks and limit milk and dairy products to one or two daily servings.   The type of carbohydrate in the diet is more important than the amount. Some sources of carbohydrates, such as vegetables, fruits, whole grains, and beans-are healthier than others.   Finally, stay active  Signed, Kassie Keng, DO  10/26/2020 3:53 PM    [facility]  

## 2020-10-26 NOTE — Patient Instructions (Addendum)
Medication Instructions:  Your physician has recommended you make the following change in your medication:  START: Rosuvastatin 10 mg take one tablet by mouth daily.   *If you need a refill on your cardiac medications before your next appointment, please call your pharmacy*   Lab Work: Your physician recommends that you return for lab work in: TODAY CBC, BMP If you have labs (blood work) drawn today and your tests are completely normal, you will receive your results only by: MyChart Message (if you have MyChart) OR A paper copy in the mail If you have any lab test that is abnormal or we need to change your treatment, we will call you to review the results.   Testing/Procedures:  [facility] CARDIOVASCULAR DIVISION [facility] [place] [address] [place] [address] Dept: [phone] Loc: [phone]  Randall Rojas  10/26/2020  You are scheduled for a Cardiac Catheterization on Thursday, August 4 with Dr. Nicki Guadalajara.  1. Please arrive at the [facility] (Main Entrance A) at [facility]: [address] [place], [place] [address] at 5:30 AM (This time is two hours before your procedure to ensure your preparation). Free valet parking service is available.   Special note: Every effort is made to have your procedure done on time. Please understand that emergencies sometimes delay scheduled procedures.  2. Diet: Do not eat solid foods after midnight.  The patient may have clear liquids until 5am upon the day of the procedure.  3. Labs: YOU HAD YOUR LABS DRAWN TOADY 10/26/2020  4. Medication instructions in preparation for your procedure:   Contrast Allergy: No  On the morning of your procedure, take your Aspirin and any morning medicines NOT listed above.  You may use sips of water.  5. Plan for one night stay--bring personal belongings. 6. Bring a current list of your medications and current insurance cards. 7. You MUST have a  responsible person to drive you home. 8. Someone MUST be with you the first 24 hours after you arrive home or your discharge will be delayed. 9. Please wear clothes that are easy to get on and off and wear slip-on shoes.  Thank you for allowing [place] to care for you!   -- [place] [facility]    Follow-Up: At [facility], you and your health needs are our priority.  As part of our continuing mission to provide you with exceptional heart care, we have created designated Provider Care Teams.  These Care Teams include your primary Cardiologist (physician) and Advanced Practice Providers (APPs -  Physician Assistants and Nurse Practitioners) who all work together to provide you with the care you need, when you need it.  We recommend signing up for the patient portal called "MyChart".  Sign up information is provided on this After Visit Summary.  MyChart is used to connect with patients for Virtual Visits (Telemedicine).  Patients are able to view lab/test results, encounter notes, upcoming appointments, etc.  Non-urgent messages can be sent to your provider as well.   To learn more about what you can do with MyChart, go to [email or web address].    Your next appointment:   6 week(s)  The format for your next appointment:   In Person  Provider:   Thomasene Ripple, DO   Other Instructions

## 2020-10-27 ENCOUNTER — Telehealth: Payer: Self-pay | Admitting: *Deleted

## 2020-10-27 DIAGNOSIS — R569 Unspecified convulsions: Secondary | ICD-10-CM | POA: Diagnosis not present

## 2020-10-27 DIAGNOSIS — R531 Weakness: Secondary | ICD-10-CM | POA: Diagnosis not present

## 2020-10-27 LAB — BASIC METABOLIC PANEL
BUN/Creatinine Ratio: 12 (ref 9–20)
BUN: 12 mg/dL (ref 6–24)
CO2: 23 mmol/L (ref 20–29)
Calcium: 9.1 mg/dL (ref 8.7–10.2)
Chloride: 103 mmol/L (ref 96–106)
Creatinine, Ser: 1.02 mg/dL (ref 0.76–1.27)
Glucose: 99 mg/dL (ref 65–99)
Potassium: 4.2 mmol/L (ref 3.5–5.2)
Sodium: 142 mmol/L (ref 134–144)
eGFR: 85 mL/min/{1.73_m2} (ref >59)

## 2020-10-27 LAB — CBC
Hematocrit: 44.6 % (ref 37.5–51.0)
Hemoglobin: 15 g/dL (ref 13.0–17.7)
MCH: 30.8 pg (ref 26.6–33.0)
MCHC: 33.6 g/dL (ref 31.5–35.7)
MCV: 92 fL (ref 79–97)
Platelets: 171 10*3/uL (ref 150–450)
RBC: 4.87 x10E6/uL (ref 4.14–5.80)
RDW: 12.8 % (ref 11.6–15.4)
WBC: 8.8 10*3/uL (ref 3.4–10.8)

## 2020-10-27 NOTE — Telephone Encounter (Signed)
Cardiac catheterization scheduled at [facility] for: Thursday October 28, 2020 7:30 AM Arrive [facility] Main Entrance A [facility]) at: 5:30 AM   No solid food after midnight prior to cath, clear liquids until 5 AM day of procedure.  Medication instructions. -Hold:  Bumex/KCl-AM of procedure - Except hold medications AM meds can be  taken pre-cath with sips of water including:   aspirin 81 mg   Confirmed patient has responsible adult to drive home post procedure and be with patient first 24 hours after arriving home.  Patients are allowed one visitor in the waiting room during the time they are at the hospital for their procedure. Both patient and visitor must wear a mask once they enter the hospital.   Patient reports does not currently have any symptoms concerning for COVID-19 and no household members with COVID-19 like illness.     Reviewed procedure/mask/visitor instructions with patient.

## 2020-10-28 ENCOUNTER — Ambulatory Visit (HOSPITAL_COMMUNITY)
Admission: RE | Admit: 2020-10-28 | Discharge: 2020-10-28 | Disposition: A | Discharge status: Home / Self Care | Payer: 59 | Attending: Cardiovascular Disease | Admitting: Cardiovascular Disease

## 2020-10-28 ENCOUNTER — Encounter (HOSPITAL_COMMUNITY): Payer: Self-pay | Admitting: Cardiovascular Disease

## 2020-10-28 ENCOUNTER — Other Ambulatory Visit: Payer: Self-pay

## 2020-10-28 ENCOUNTER — Encounter (HOSPITAL_COMMUNITY)
Admission: RE | Disposition: A | Discharge status: Home / Self Care | Payer: Self-pay | Source: Home / Self Care | Attending: Cardiovascular Disease

## 2020-10-28 DIAGNOSIS — R569 Unspecified convulsions: Secondary | ICD-10-CM | POA: Diagnosis not present

## 2020-10-28 DIAGNOSIS — Z87891 Personal history of nicotine dependence: Secondary | ICD-10-CM | POA: Diagnosis not present

## 2020-10-28 DIAGNOSIS — Z7982 Long term (current) use of aspirin: Secondary | ICD-10-CM | POA: Diagnosis not present

## 2020-10-28 DIAGNOSIS — E782 Mixed hyperlipidemia: Secondary | ICD-10-CM | POA: Diagnosis not present

## 2020-10-28 DIAGNOSIS — Z8249 Family history of ischemic heart disease and other diseases of the circulatory system: Secondary | ICD-10-CM | POA: Diagnosis not present

## 2020-10-28 DIAGNOSIS — I110 Hypertensive heart disease with heart failure: Secondary | ICD-10-CM | POA: Diagnosis not present

## 2020-10-28 DIAGNOSIS — Z881 Allergy status to other antibiotic agents status: Secondary | ICD-10-CM | POA: Insufficient documentation

## 2020-10-28 DIAGNOSIS — I5032 Chronic diastolic (congestive) heart failure: Secondary | ICD-10-CM | POA: Insufficient documentation

## 2020-10-28 DIAGNOSIS — I25118 Atherosclerotic heart disease of native coronary artery with other forms of angina pectoris: Secondary | ICD-10-CM | POA: Diagnosis not present

## 2020-10-28 DIAGNOSIS — Z6841 Body Mass Index (BMI) 40.0 and over, adult: Secondary | ICD-10-CM | POA: Insufficient documentation

## 2020-10-28 DIAGNOSIS — Z79899 Other long term (current) drug therapy: Secondary | ICD-10-CM | POA: Insufficient documentation

## 2020-10-28 DIAGNOSIS — Z8673 Personal history of transient ischemic attack (TIA), and cerebral infarction without residual deficits: Secondary | ICD-10-CM | POA: Diagnosis not present

## 2020-10-28 DIAGNOSIS — R079 Chest pain, unspecified: Secondary | ICD-10-CM | POA: Diagnosis present

## 2020-10-28 HISTORY — PX: LEFT HEART CATH AND CORONARY ANGIOGRAPHY: CATH118249

## 2020-10-28 SURGERY — LEFT HEART CATH AND CORONARY ANGIOGRAPHY
Anesthesia: LOCAL

## 2020-10-28 MED ORDER — MIDAZOLAM HCL 2 MG/2ML IJ SOLN
INTRAMUSCULAR | Status: AC
  Filled 2020-10-28: qty 2

## 2020-10-28 MED ORDER — HEPARIN SODIUM (PORCINE) 1000 UNIT/ML IJ SOLN
INTRAMUSCULAR | Status: AC
  Filled 2020-10-28: qty 1

## 2020-10-28 MED ORDER — SODIUM CHLORIDE 0.9 % WEIGHT BASED INFUSION: 1.0000 mL/kg/h | INTRAVENOUS | Status: DC

## 2020-10-28 MED ORDER — HEPARIN SODIUM (PORCINE) 1000 UNIT/ML IJ SOLN
INTRAMUSCULAR | Status: DC | PRN
  Administered 2020-10-28: 7000 [IU] via INTRAVENOUS

## 2020-10-28 MED ORDER — VERAPAMIL HCL 2.5 MG/ML IV SOLN
INTRAVENOUS | Status: DC | PRN
  Administered 2020-10-28: 10 mL via INTRA_ARTERIAL

## 2020-10-28 MED ORDER — SODIUM CHLORIDE 0.9% FLUSH: 3.0000 mL | INTRAVENOUS | Status: DC | PRN

## 2020-10-28 MED ORDER — ACETAMINOPHEN 325 MG PO TABS: 650.0000 mg | ORAL_TABLET | ORAL | Status: DC | PRN

## 2020-10-28 MED ORDER — SODIUM CHLORIDE 0.9% FLUSH: 3.0000 mL | Freq: Two times a day (BID) | INTRAVENOUS | Status: DC

## 2020-10-28 MED ORDER — SODIUM CHLORIDE 0.9 % WEIGHT BASED INFUSION
3.0000 mL/kg/h | INTRAVENOUS | Status: AC
  Administered 2020-10-28: 3 mL/kg/h via INTRAVENOUS

## 2020-10-28 MED ORDER — MIDAZOLAM HCL 2 MG/2ML IJ SOLN
INTRAMUSCULAR | Status: DC | PRN
  Administered 2020-10-28: 2 mg via INTRAVENOUS

## 2020-10-28 MED ORDER — LIDOCAINE HCL (PF) 1 % IJ SOLN
INTRAMUSCULAR | Status: DC | PRN
  Administered 2020-10-28: 3 mL via INTRADERMAL

## 2020-10-28 MED ORDER — ASPIRIN 81 MG PO CHEW: 81.0000 mg | CHEWABLE_TABLET | Freq: Every day | ORAL | Status: DC

## 2020-10-28 MED ORDER — ONDANSETRON HCL 4 MG/2ML IJ SOLN: 4.0000 mg | Freq: Four times a day (QID) | INTRAMUSCULAR | Status: DC | PRN

## 2020-10-28 MED ORDER — SODIUM CHLORIDE 0.9 % IV SOLN: 250.0000 mL | INTRAVENOUS | Status: DC | PRN

## 2020-10-28 MED ORDER — HEPARIN (PORCINE) IN NACL 1000-0.9 UT/500ML-% IV SOLN
INTRAVENOUS | Status: DC | PRN
  Administered 2020-10-28 (×2): 500 mL

## 2020-10-28 MED ORDER — FENTANYL CITRATE (PF) 100 MCG/2ML IJ SOLN
INTRAMUSCULAR | Status: DC | PRN
  Administered 2020-10-28: 25 ug via INTRAVENOUS

## 2020-10-28 MED ORDER — LABETALOL HCL 5 MG/ML IV SOLN: 10.0000 mg | INTRAVENOUS | Status: DC | PRN

## 2020-10-28 MED ORDER — HYDRALAZINE HCL 20 MG/ML IJ SOLN: 10.0000 mg | INTRAMUSCULAR | Status: DC | PRN

## 2020-10-28 MED ORDER — IOHEXOL 350 MG/ML SOLN
INTRAVENOUS | Status: DC | PRN
  Administered 2020-10-28: 135 mL via INTRA_ARTERIAL

## 2020-10-28 MED ORDER — LIDOCAINE HCL (PF) 1 % IJ SOLN
INTRAMUSCULAR | Status: AC
  Filled 2020-10-28: qty 30

## 2020-10-28 MED ORDER — VERAPAMIL HCL 2.5 MG/ML IV SOLN
INTRAVENOUS | Status: AC
  Filled 2020-10-28: qty 2

## 2020-10-28 MED ORDER — SODIUM CHLORIDE 0.9 % IV SOLN: INTRAVENOUS | Status: DC

## 2020-10-28 MED ORDER — FENTANYL CITRATE (PF) 100 MCG/2ML IJ SOLN
INTRAMUSCULAR | Status: AC
  Filled 2020-10-28: qty 2

## 2020-10-28 MED ORDER — DIAZEPAM 5 MG PO TABS: 5.0000 mg | ORAL_TABLET | Freq: Four times a day (QID) | ORAL | Status: DC | PRN

## 2020-10-28 MED ORDER — HEPARIN (PORCINE) IN NACL 1000-0.9 UT/500ML-% IV SOLN
INTRAVENOUS | Status: AC
  Filled 2020-10-28: qty 1000

## 2020-10-28 SURGICAL SUPPLY — 16 items
CATH INFINITI 5 FR 3DRC (CATHETERS) ×1 IMPLANT
CATH INFINITI 5 FR AR1 MOD (CATHETERS) ×1 IMPLANT
CATH INFINITI 5FR AL1 (CATHETERS) ×1 IMPLANT
CATH INFINITI JR4 5F (CATHETERS) ×1 IMPLANT
CATH OPTITORQUE TIG 4.0 5F (CATHETERS) ×1 IMPLANT
DEVICE RAD COMP TR BAND LRG (VASCULAR PRODUCTS) ×1 IMPLANT
GLIDESHEATH SLEND SS 6F .021 (SHEATH) ×1 IMPLANT
GUIDEWIRE INQWIRE 1.5J.035X260 (WIRE) IMPLANT
INQWIRE 1.5J .035X260CM (WIRE) ×2
KIT HEART LEFT (KITS) ×2 IMPLANT
PACK CARDIAC CATHETERIZATION (CUSTOM PROCEDURE TRAY) ×2 IMPLANT
SHEATH PROBE COVER 6X72 (BAG) ×1 IMPLANT
SYR MEDRAD MARK 7 150ML (SYRINGE) ×2 IMPLANT
TRANSDUCER W/STOPCOCK (MISCELLANEOUS) ×2 IMPLANT
TUBING CIL FLEX 10 FLL-RA (TUBING) ×2 IMPLANT
WIRE HI TORQ VERSACORE-J 145CM (WIRE) ×1 IMPLANT

## 2020-10-28 NOTE — Progress Notes (Signed)
Pt ambulated without difficulty or bleeding.   Discharged home with his wife who will drive and stay with pt x 24 hrs. 

## 2020-10-28 NOTE — Interval H&P Note (Signed)
Cath Lab Visit (complete for each Cath Lab visit)  Clinical Evaluation Leading to the Procedure:   ACS: No.  Non-ACS:    Anginal Classification: CCS II  Anti-ischemic medical therapy: Minimal Therapy (1 class of medications)  Non-Invasive Test Results: No non-invasive testing performed  Prior CABG: No previous CABG      History and Physical Interval Note:  10/28/2020 7:43 AM  Randall Rojas  has presented today for surgery, with the diagnosis of cp.  The various methods of treatment have been discussed with the patient and family. After consideration of risks, benefits and other options for treatment, the patient has consented to  Procedure(s): LEFT HEART CATH AND CORONARY ANGIOGRAPHY (N/A) as a surgical intervention.  The patient's history has been reviewed, patient examined, no change in status, stable for surgery.  I have reviewed the patient's chart and labs.  Questions were answered to the patient's satisfaction.     Nicki Guadalajara

## 2020-10-28 NOTE — Progress Notes (Signed)
VAST received consult to obtain 2nd IV access for procedure. Upon arrival at bedside patient had already been taken back to cath lab.

## 2020-10-28 NOTE — Discharge Instructions (Signed)
Radial Site Care  This sheet gives you information about how to care for yourself after your procedure. Your health care provider may also give you more specific instructions. If you have problems or questions, contact your health care provider. What can I expect after the procedure? After the procedure, it is common to have: Bruising and tenderness at the catheter insertion area. Follow these instructions at home: Medicines Take over-the-counter and prescription medicines only as told by your health care provider. Insertion site care Follow instructions from your health care provider about how to take care of your insertion site. Make sure you: Wash your hands with soap and water before you remove your bandage (dressing). If soap and water are not available, use hand sanitizer. May remove dressing in 24 hours. Check your insertion site every day for signs of infection. Check for: Redness, swelling, or pain. Fluid or blood. Pus or a bad smell. Warmth. Do no take baths, swim, or use a hot tub for 5 days. You may shower 24-48 hours after the procedure. Remove the dressing and gently wash the site with plain soap and water. Pat the area dry with a clean towel. Do not rub the site. That could cause bleeding. Do not apply powder or lotion to the site. Activity  For 24 hours after the procedure, or as directed by your health care provider: Do not flex or bend the affected arm. Do not push or pull heavy objects with the affected arm. Do not drive yourself home from the hospital or clinic. You may drive 24 hours after the procedure. Do not operate machinery or power tools. KEEP ARM ELEVATED THE REMAINDER OF THE DAY. Do not push, pull or lift anything that is heavier than 10 lb for 5 days. Ask your health care provider when it is okay to: Return to work or school. Resume usual physical activities or sports. Resume sexual activity. General instructions If the catheter site starts to  bleed, raise your arm and put firm pressure on the site. If the bleeding does not stop, get help right away. This is a medical emergency. DRINK PLENTY OF FLUIDS FOR THE NEXT 2-3 DAYS. No alcohol consumption for 24 hours after receiving sedation. If you went home on the same day as your procedure, a responsible adult should be with you for the first 24 hours after you arrive home. Keep all follow-up visits as told by your health care provider. This is important. Contact a health care provider if: You have a fever. You have redness, swelling, or yellow drainage around your insertion site. Get help right away if: You have unusual pain at the radial site. The catheter insertion area swells very fast. The insertion area is bleeding, and the bleeding does not stop when you hold steady pressure on the area. Your arm or hand becomes pale, cool, tingly, or numb. These symptoms may represent a serious problem that is an emergency. Do not wait to see if the symptoms will go away. Get medical help right away. Call your local emergency services (911 in the [place] Do not drive yourself to the hospital. Summary After the procedure, it is common to have bruising and tenderness at the site. Follow instructions from your health care provider about how to take care of your radial site wound. Check the wound every day for signs of infection.  This information is not intended to replace advice given to you by your health care provider. Make sure you discuss any questions you have with   your health care provider. Document Revised: 04/18/2017 Document Reviewed: 04/18/2017 Elsevier Patient Education  2020 Elsevier Inc.  

## 2020-11-01 ENCOUNTER — Encounter: Payer: Self-pay | Admitting: Physician Assistant

## 2020-11-01 ENCOUNTER — Ambulatory Visit: Payer: 59 | Admitting: Physician Assistant

## 2020-11-01 ENCOUNTER — Other Ambulatory Visit: Payer: Self-pay

## 2020-11-01 VITALS — BP 130/78 | HR 87 | Temp 97.9°F | Ht 64.0 in | Wt 305.8 lb

## 2020-11-01 DIAGNOSIS — K625 Hemorrhage of anus and rectum: Secondary | ICD-10-CM

## 2020-11-01 HISTORY — DX: Hemorrhage of anus and rectum: K62.5

## 2020-11-01 NOTE — Progress Notes (Signed)
Acute Office Visit  Subjective:    Patient ID: Randall Rojas, male    DOB: 06-07-1960, 60 y.o.   MRN: [id number]  Chief Complaint  Patient presents with   Blood In Stools    HPI Patient is in today for rectal bleeding --- pt states last week he had a cardiac cath - was not able to go to the bathroom that day but for the next 3 days with bms he was straining and had some blood noted in bowels --- the bleeding is now stopped and he is not having melena Denies fever, abdominal pain, nausea or vomiting Pt actually had a normal colonoscopy with Dr Charm Barges a few months ago  Past Medical History:  Diagnosis Date   Asthma    Chest pain 04/15/2015   Coronary artery calcification seen on CT scan 04/15/2015   Coronary artery disease    Essential hypertension 04/15/2015   Hyperlipidemia 04/15/2015   Morbid obesity (HCC) 04/15/2015   TIA (transient ischemic attack)     Past Surgical History:  Procedure Laterality Date   CARDIAC CATHETERIZATION N/A 04/22/2015   Procedure: Left Heart Cath and Coronary Angiography;  Surgeon: Peter M Swaziland, MD;  Location: [facility] INVASIVE CV LAB;  Service: Cardiovascular;  Laterality: N/A;   GALLBLADDER SURGERY     LEFT HEART CATH AND CORONARY ANGIOGRAPHY N/A 10/28/2020   Procedure: LEFT HEART CATH AND CORONARY ANGIOGRAPHY;  Surgeon: Lennette Bihari, MD;  Location: [facility] INVASIVE CV LAB;  Service: Cardiovascular;  Laterality: N/A;   SINUS SURGERY WITH INSTATRAK      Family History  Problem Relation Age of Onset   Hypertension Mother    Rheum arthritis Mother    Cancer Father    Heart attack Father    Cancer Brother    Hypertension Sister     Social History   Socioeconomic History   Marital status: Married    Spouse name: Not on file   Number of children: Not on file   Years of education: Not on file   Highest education level: Not on file  Occupational History   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  Tobacco Use   Smoking status: Former    Types: Cigarettes     Quit date: 04/13/1989    Years since quitting: 31.5   Smokeless tobacco: Never  Substance and Sexual Activity   Alcohol use: Not on file   Drug use: Not on file   Sexual activity: Not on file  Other Topics Concern   Not on file  Social History Narrative   Epworth Sleepiness Scale = 7 (as of 1/18/207)   Social Determinants of Health   Financial Resource Strain: Not on file  Food Insecurity: Not on file  Transportation Needs: Not on file  Physical Activity: Not on file  Stress: Not on file  Social Connections: Not on file  Intimate Partner Violence: Not on file    Outpatient Medications Prior to Visit  Medication Sig Dispense Refill   acetaminophen (TYLENOL) 650 MG CR tablet Take 650 mg by mouth every 8 (eight) hours as needed for pain.     albuterol (VENTOLIN HFA) 108 (90 Base) MCG/ACT inhaler Inhale 2 puffs into the lungs every 6 (six) hours as needed for wheezing or shortness of breath. 8 g 0   aspirin 325 MG EC tablet Take 325 mg by mouth daily.     atorvastatin (LIPITOR) 80 MG tablet Take 80 mg by mouth daily.     baclofen (LIORESAL) 10 MG  tablet Take 10-20 tablets by mouth every 6 (six) hours as needed (pain).     bumetanide (BUMEX) 2 MG tablet Take 1 tablet (2 mg total) by mouth 2 (two) times daily. (Patient taking differently: Take 2 mg by mouth 2 (two) times daily as needed (fluid).) 180 tablet 3   busPIRone (BUSPAR) 5 MG tablet Take 5 mg by mouth 3 (three) times daily.     cyanocobalamin 1000 MCG tablet Take 1,000 mcg by mouth daily.     diclofenac (VOLTAREN) 75 MG EC tablet Take 75 mg by mouth 2 (two) times daily.     levETIRAcetam (KEPPRA) 750 MG tablet Take 750 mg by mouth in the morning and at bedtime.     metolazone (ZAROXOLYN) 5 MG tablet Take 30 minutes before first Bumex for 3 days. 3 tablet 0   potassium chloride SA (KLOR-CON) 20 MEQ tablet Take 1 tablet (20 mEq total) by mouth 2 (two) times daily. (Patient taking differently: Take 20 mEq by mouth 2 (two) times  daily as needed (when takin bumex).) 180 tablet 3   promethazine (PHENERGAN) 25 MG tablet Take 25 mg by mouth every 6 (six) hours as needed for nausea or vomiting.     ranolazine (RANEXA) 1000 MG SR tablet Take 1 tablet (1,000 mg total) by mouth 2 (two) times daily. 180 tablet 3   rosuvastatin (CRESTOR) 10 MG tablet Take 1 tablet (10 mg total) by mouth daily. 90 tablet 3   nitroGLYCERIN (NITROSTAT) 0.4 MG SL tablet Place 1 tablet (0.4 mg total) under the tongue every 5 (five) minutes as needed for chest pain. 90 tablet 3   No facility-administered medications prior to visit.    Allergies  Allergen Reactions   Cephalexin Nausea And Vomiting and Rash   Ubrogepant Swelling    Review of Systems CONSTITUTIONAL: Negative for chills, fatigue, fever, unintentional weight gain and unintentional weight loss.  CARDIOVASCULAR: Negative for chest pain, dizziness, palpitations and pedal edema.  RESPIRATORY: Negative for recent cough and dyspnea.  GASTROINTESTINAL: Negative for abdominal pain, acid reflux symptoms, constipation, diarrhea, nausea and vomiting.          Objective:    Physical Exam PHYSICAL EXAM:   VS: BP 130/78 (BP Location: Left Arm, Patient Position: Sitting, Cuff Size: Large)   Pulse 87   Temp 97.9 F (36.6 C) (Temporal)   Ht $R'5\' 4"'Tf$  (1.626 m)   Wt (!) 305 lb 12.8 oz (138.7 kg)   SpO2 92%   BMI 52.49 kg/m   GEN: Well nourished, well developed, in no acute distress   Cardiac: RRR; no murmurs, rubs, or gallops,no edema -  Respiratory:  normal respiratory rate and pattern with no distress - normal breath sounds with no rales, rhonchi, wheezes or rubs GI: normal bowel sounds, no masses or tenderness   BP 130/78 (BP Location: Left Arm, Patient Position: Sitting, Cuff Size: Large)   Pulse 87   Temp 97.9 F (36.6 C) (Temporal)   Ht $R'5\' 4"'Qm$  (1.626 m)   Wt (!) 305 lb 12.8 oz (138.7 kg)   SpO2 92%   BMI 52.49 kg/m  Wt Readings from Last 3 Encounters:  11/01/20 (!) 305 lb  12.8 oz (138.7 kg)  10/28/20 (!) 304 lb (137.9 kg)  10/26/20 (!) 304 lb 9.6 oz (138.2 kg)    Health Maintenance Due  Topic Date Due   INFLUENZA VACCINE  10/25/2020    There are no preventive care reminders to display for this patient.   Lab Results  Component Value Date   TSH 2.040 09/24/2020   Lab Results  Component Value Date   WBC 8.8 10/26/2020   HGB 15.0 10/26/2020   HCT 44.6 10/26/2020   MCV 92 10/26/2020   PLT 171 10/26/2020   Lab Results  Component Value Date   NA 142 10/26/2020   K 4.2 10/26/2020   CO2 23 10/26/2020   GLUCOSE 99 10/26/2020   BUN 12 10/26/2020   CREATININE 1.02 10/26/2020   BILITOT 0.6 09/24/2020   ALKPHOS 104 09/24/2020   AST 18 09/24/2020   ALT 24 09/24/2020   PROT 6.5 09/24/2020   ALBUMIN 4.1 09/24/2020   CALCIUM 9.1 10/26/2020   ANIONGAP 11 04/07/2019   EGFR 85 10/26/2020   Lab Results  Component Value Date   CHOL 105 09/24/2020   Lab Results  Component Value Date   HDL 32 (L) 09/24/2020   Lab Results  Component Value Date   LDLCALC 53 09/24/2020   Lab Results  Component Value Date   TRIG 110 09/24/2020   Lab Results  Component Value Date   CHOLHDL 3.3 09/24/2020   Lab Results  Component Value Date   HGBA1C 6.3 (H) 09/24/2020       Assessment & Plan:  1. Rectal bleeding - CBC with Differential/Platelet  Reassurance given If recurs then recommend to consult with Dr Melina Copa  No orders of the defined types were placed in this encounter.   Orders Placed This Encounter  Procedures   CBC with Differential/Platelet      Follow-up: Return if symptoms worsen or fail to improve.  An After Visit Summary was printed and given to the patient.  Yetta Flock [facility] [phone]

## 2020-11-02 LAB — CBC WITH DIFFERENTIAL/PLATELET
Basophils Absolute: 0.1 10*3/uL (ref 0.0–0.2)
Basos: 1 %
EOS (ABSOLUTE): 0.3 10*3/uL (ref 0.0–0.4)
Eos: 4 %
Hematocrit: 44.9 % (ref 37.5–51.0)
Hemoglobin: 14.8 g/dL (ref 13.0–17.7)
Immature Grans (Abs): 0 10*3/uL (ref 0.0–0.1)
Immature Granulocytes: 0 %
Lymphocytes Absolute: 2.9 10*3/uL (ref 0.7–3.1)
Lymphs: 35 %
MCH: 30.7 pg (ref 26.6–33.0)
MCHC: 33 g/dL (ref 31.5–35.7)
MCV: 93 fL (ref 79–97)
Monocytes Absolute: 0.7 10*3/uL (ref 0.1–0.9)
Monocytes: 8 %
Neutrophils Absolute: 4.3 10*3/uL (ref 1.4–7.0)
Neutrophils: 52 %
Platelets: 155 10*3/uL (ref 150–450)
RBC: 4.82 x10E6/uL (ref 4.14–5.80)
RDW: 13 % (ref 11.6–15.4)
WBC: 8.3 10*3/uL (ref 3.4–10.8)

## 2020-11-03 ENCOUNTER — Telehealth: Payer: Self-pay | Admitting: *Deleted

## 2020-11-03 NOTE — Telephone Encounter (Signed)
Submitted PA to CoverMyMeds for Ranolazine ER 1000MG  ER Tabs

## 2020-11-04 ENCOUNTER — Telehealth: Payer: Self-pay | Admitting: Cardiology

## 2020-11-04 NOTE — Telephone Encounter (Signed)
He did not tolerate the Imdur.  We can make an appeal to insurance company for him to take this medication.

## 2020-11-04 NOTE — Telephone Encounter (Signed)
Pt c/o medication issue:  1. Name of Medication: ranolazine (RANEXA) 1000 MG SR tablet  2. How are you currently taking this medication (dosage and times per day)? 1 tablet by mouth twice a day   3. Are you having a reaction (difficulty breathing--STAT)? no  4. What is your medication issue? Patient's wife called to say that the insurance want cover this medication. Calling to see if Dr.can prescribe a different kind. Please advise   Patient's wife is also calling back the sleep study

## 2020-11-05 NOTE — Telephone Encounter (Signed)
Patient's wife was calling back for update. Please advise

## 2020-11-05 NOTE — Telephone Encounter (Signed)
Faythe Ghee Key: [id number] - PA Case ID: [id number] - Rx #: [id number] Outcome Deniedon August 11 Your PA request has been denied.   Drug Ranolazine ER 1000MG  er tablets     Re: DOB: 08/07/1960 Notice of adverse decision We received and reviewed your request for authorization of coverage for RANOLAZINE TAB 1000MG  ER. We've denied the request for the following reason(s): You do not meet the requirements of your plan. Your plan covers this drug when you meet any of these conditions: - You tried the alternate drug(s) while covered by the current or the previous health benefit plan - The alternate drug(s) caused or is reasonably expected to cause an adverse reaction for you - You tried the alternate drug(s) and it did not work for you Your request has been denied based on the information we have. Examples of potential formulary alternative: nicardipine, felodipine, isosorbide dinitrate An explanation of the full clinical review criteria used in our decision is included in this notic

## 2020-11-08 ENCOUNTER — Telehealth: Payer: Self-pay | Admitting: Cardiology

## 2020-11-08 NOTE — Telephone Encounter (Signed)
Left message for patient to return the call.

## 2020-11-08 NOTE — Telephone Encounter (Signed)
Follow Up:      Patient was calling to find  out what was decided about his Ranexa?

## 2020-11-08 NOTE — Telephone Encounter (Signed)
Pt is calling so let the doctor know he got his medicaid card so he can get his medication

## 2020-11-11 ENCOUNTER — Telehealth (INDEPENDENT_AMBULATORY_CARE_PROVIDER_SITE_OTHER): Payer: 59 | Admitting: Physician Assistant

## 2020-11-11 ENCOUNTER — Encounter: Payer: Self-pay | Admitting: Physician Assistant

## 2020-11-11 VITALS — HR 100 | Temp 97.7°F | Ht 64.0 in | Wt 305.0 lb

## 2020-11-11 DIAGNOSIS — J060 Acute laryngopharyngitis: Secondary | ICD-10-CM

## 2020-11-11 HISTORY — DX: Acute laryngopharyngitis: J06.0

## 2020-11-11 MED ORDER — AZITHROMYCIN 250 MG PO TABS: ORAL_TABLET | ORAL | 0 refills | Status: AC

## 2020-11-11 NOTE — Progress Notes (Signed)
Virtual Visit via Telephone Note   This visit type was conducted due to national recommendations for restrictions regarding the COVID-19 Pandemic (e.g. social distancing) in an effort to limit this patient's exposure and mitigate transmission in our community.  Due to his co-morbid illnesses, this patient is at least at moderate risk for complications without adequate follow up.  This format is felt to be most appropriate for this patient at this time.  The patient did not have access to video technology/had technical difficulties with video requiring transitioning to audio format only (telephone).  All issues noted in this document were discussed and addressed.  No physical exam could be performed with this format.  Patient verbally consented to a telehealth visit.   Date:  11/11/2020   ID:  Randall Rojas, DOB Aug 16, 1960, MRN [id number]  Patient Location: Home Provider Location: Office  PCP:  Marianne Sofia, PA-C     Chief Complaint:  URI symptoms  History of Present Illness:    Randall Rojas is a 60 y.o. male with complaints of cough and sinus drainage for the past several days - states cough is productive - denies fever, malaise Is not using any type of otc meds for symptoms Did home COVID test which is negative  The patient does not have symptoms concerning for COVID-19 infection (fever, chills, cough, or new shortness of breath).    Past Medical History:  Diagnosis Date   Asthma    Chest pain 04/15/2015   Coronary artery calcification seen on CT scan 04/15/2015   Coronary artery disease    Essential hypertension 04/15/2015   Hyperlipidemia 04/15/2015   Morbid obesity (HCC) 04/15/2015   TIA (transient ischemic attack)    Past Surgical History:  Procedure Laterality Date   CARDIAC CATHETERIZATION N/A 04/22/2015   Procedure: Left Heart Cath and Coronary Angiography;  Surgeon: Peter M Swaziland, MD;  Location: [facility] INVASIVE CV LAB;  Service: Cardiovascular;  Laterality: N/A;    GALLBLADDER SURGERY     LEFT HEART CATH AND CORONARY ANGIOGRAPHY N/A 10/28/2020   Procedure: LEFT HEART CATH AND CORONARY ANGIOGRAPHY;  Surgeon: Lennette Bihari, MD;  Location: [facility] INVASIVE CV LAB;  Service: Cardiovascular;  Laterality: N/A;   SINUS SURGERY WITH INSTATRAK       Current Meds  Medication Sig   acetaminophen (TYLENOL) 650 MG CR tablet Take 650 mg by mouth every 8 (eight) hours as needed for pain.   albuterol (VENTOLIN HFA) 108 (90 Base) MCG/ACT inhaler Inhale 2 puffs into the lungs every 6 (six) hours as needed for wheezing or shortness of breath.   aspirin 325 MG EC tablet Take 325 mg by mouth daily.   azithromycin (ZITHROMAX) 250 MG tablet Take 2 tablets on day 1, then 1 tablet daily on days 2 through 5   baclofen (LIORESAL) 10 MG tablet Take 10-20 tablets by mouth every 6 (six) hours as needed (pain).   bumetanide (BUMEX) 2 MG tablet Take 1 tablet (2 mg total) by mouth 2 (two) times daily. (Patient taking differently: Take 2 mg by mouth 2 (two) times daily as needed (fluid).)   busPIRone (BUSPAR) 5 MG tablet Take 5 mg by mouth 3 (three) times daily.   cyanocobalamin 1000 MCG tablet Take 1,000 mcg by mouth daily.   diclofenac (VOLTAREN) 75 MG EC tablet Take 75 mg by mouth 2 (two) times daily.   levETIRAcetam (KEPPRA) 750 MG tablet Take 750 mg by mouth in the morning and at bedtime.   metolazone (ZAROXOLYN) 5  MG tablet Take 30 minutes before first Bumex for 3 days.   potassium chloride SA (KLOR-CON) 20 MEQ tablet Take 1 tablet (20 mEq total) by mouth 2 (two) times daily. (Patient taking differently: Take 20 mEq by mouth 2 (two) times daily as needed (when takin bumex).)   promethazine (PHENERGAN) 25 MG tablet Take 25 mg by mouth every 6 (six) hours as needed for nausea or vomiting.   ranolazine (RANEXA) 1000 MG SR tablet Take 1 tablet (1,000 mg total) by mouth 2 (two) times daily.   rosuvastatin (CRESTOR) 10 MG tablet Take 1 tablet (10 mg total) by mouth daily.     Allergies:    Cephalexin and Ubrogepant   Social History   Tobacco Use   Smoking status: Former    Types: Cigarettes    Quit date: 04/13/1989    Years since quitting: 31.6   Smokeless tobacco: Never     Family Hx: The patient's family history includes Cancer in his brother and father; Heart attack in his father; Hypertension in his mother and sister; Rheum arthritis in his mother.  ROS:   Please see the history of present illness.    All other systems reviewed and are negative.  Labs/Other Tests and Data Reviewed:    Recent Labs: 08/25/2020: Magnesium 2.1 09/24/2020: ALT 24; TSH 2.040 10/26/2020: BUN 12; Creatinine, Ser 1.02; Potassium 4.2; Sodium 142 11/01/2020: Hemoglobin 14.8; Platelets 155   Recent Lipid Panel Lab Results  Component Value Date/Time   CHOL 105 09/24/2020 09:26 AM   TRIG 110 09/24/2020 09:26 AM   HDL 32 (L) 09/24/2020 09:26 AM   CHOLHDL 3.3 09/24/2020 09:26 AM   CHOLHDL 6.2 04/07/2019 03:34 AM   LDLCALC 53 09/24/2020 09:26 AM    Wt Readings from Last 3 Encounters:  11/11/20 (!) 305 lb (138.3 kg)  11/01/20 (!) 305 lb 12.8 oz (138.7 kg)  10/28/20 (!) 304 lb (137.9 kg)     Objective:    Vital Signs:  Pulse 100   Temp 97.7 F (36.5 C) (Temporal)   Ht 5\' 4"  (1.626 m)   Wt (!) 305 lb (138.3 kg)   BMI 52.35 kg/m    VITAL SIGNS:  reviewed GEN:  no acute distress  ASSESSMENT & PLAN:    URI - rx for zpack and mucinex as directed - has tessalon perles which he can take at home  COVID-19 Education: The signs and symptoms of COVID-19 were discussed with the patient and how to seek care for testing (follow up with PCP or arrange E-visit). The importance of social distancing was discussed today.  Time:   Today, I have spent 10 minutes with the patient with telehealth technology discussing the above problems.     Medication Adjustments/Labs and Tests Ordered: Current medicines are reviewed at length with the patient today.  Concerns regarding medicines are outlined  above.   Tests Ordered: No orders of the defined types were placed in this encounter.   Medication Changes: Meds ordered this encounter  Medications   azithromycin (ZITHROMAX) 250 MG tablet    Sig: Take 2 tablets on day 1, then 1 tablet daily on days 2 through 5    Dispense:  6 tablet    Refill:  0    Order Specific Question:   Supervising Provider    Answer    Follow Up:  In Person prn  Signed, Corey Harold, PA-C  11/11/2020 1:43 PM    [facility] 11/13/2020

## 2020-11-12 ENCOUNTER — Telehealth: Payer: Self-pay | Admitting: Cardiology

## 2020-11-12 MED ORDER — ISOSORBIDE MONONITRATE ER 30 MG PO TB24: 30.0000 mg | ORAL_TABLET | Freq: Every day | ORAL | 3 refills | Status: DC

## 2020-11-12 NOTE — Telephone Encounter (Signed)
I will be happy to try you again on Imdur 30 mg daily.  But the last time we had you on his medication you could not tolerate it based on headaches.

## 2020-11-12 NOTE — Telephone Encounter (Signed)
Left message for patient to return the call in regards to trying Imdur again.

## 2020-11-12 NOTE — Telephone Encounter (Signed)
   Pt c/o medication issue:  1. Name of Medication: ranolazine (RANEXA) 1000 MG SR tablet  2. How are you currently taking this medication (dosage and times per day)? Take 1 tablet (1,000 mg total) by mouth 2 (two) times daily.  3. Are you having a reaction (difficulty breathing--STAT)?   4. What is your medication issue? Pt's wife said, this med is not covering pt's insurance, she wanted to ask Dr. Servando Salina if she can prescribed something similar that the insurance will cover

## 2020-11-12 NOTE — Addendum Note (Signed)
Addended by: Reynolds Bowl on: 11/12/2020 11:59 AM   Modules accepted: Orders

## 2020-11-12 NOTE — Telephone Encounter (Signed)
Spoke with patient and his wife about restarting Imdur. She states they think the headaches were coming from the TIA and not from the medication. They were instructed to reach out to [place] if they feel the medication is not working or causing symptoms. Prescription sent to pharmacy.

## 2020-11-18 ENCOUNTER — Encounter: Payer: Self-pay | Admitting: *Deleted

## 2020-11-18 NOTE — Telephone Encounter (Signed)
Submitted another PA for Ranolazine to C[facility]. PA case ID [id number] Key [id number]

## 2020-12-06 ENCOUNTER — Telehealth: Payer: Self-pay | Admitting: Cardiology

## 2020-12-06 NOTE — Telephone Encounter (Signed)
Follow Up:      Wife is calling to find out about when patient will have his Sleep Study?

## 2020-12-07 NOTE — Telephone Encounter (Signed)
Per Darlene R at [facility] NO PA reqiured.

## 2020-12-08 NOTE — Telephone Encounter (Signed)
Pt's wife is f/u to get an appt for pts' sleep study. Please advise pt's wife Darel Hong [phone]

## 2020-12-13 ENCOUNTER — Other Ambulatory Visit: Payer: Self-pay

## 2020-12-14 ENCOUNTER — Other Ambulatory Visit: Payer: Self-pay

## 2020-12-14 ENCOUNTER — Encounter: Payer: Self-pay | Admitting: Cardiology

## 2020-12-14 ENCOUNTER — Ambulatory Visit: Payer: 59 | Admitting: Cardiology

## 2020-12-14 VITALS — BP 128/84 | HR 89 | Ht 64.0 in | Wt 301.8 lb

## 2020-12-14 DIAGNOSIS — I5032 Chronic diastolic (congestive) heart failure: Secondary | ICD-10-CM

## 2020-12-14 DIAGNOSIS — E782 Mixed hyperlipidemia: Secondary | ICD-10-CM | POA: Diagnosis not present

## 2020-12-14 DIAGNOSIS — R7303 Prediabetes: Secondary | ICD-10-CM

## 2020-12-14 DIAGNOSIS — R002 Palpitations: Secondary | ICD-10-CM

## 2020-12-14 DIAGNOSIS — R0600 Dyspnea, unspecified: Secondary | ICD-10-CM

## 2020-12-14 DIAGNOSIS — I10 Essential (primary) hypertension: Secondary | ICD-10-CM | POA: Diagnosis not present

## 2020-12-14 DIAGNOSIS — G459 Transient cerebral ischemic attack, unspecified: Secondary | ICD-10-CM | POA: Diagnosis not present

## 2020-12-14 DIAGNOSIS — R400 Somnolence: Secondary | ICD-10-CM

## 2020-12-14 DIAGNOSIS — I2510 Atherosclerotic heart disease of native coronary artery without angina pectoris: Secondary | ICD-10-CM | POA: Diagnosis not present

## 2020-12-14 DIAGNOSIS — R0609 Other forms of dyspnea: Secondary | ICD-10-CM

## 2020-12-14 MED ORDER — ISOSORBIDE MONONITRATE ER 60 MG PO TB24: 60.0000 mg | ORAL_TABLET | Freq: Every day | ORAL | 3 refills | Status: DC

## 2020-12-14 NOTE — Patient Instructions (Signed)
Medication Instructions:  Your physician has recommended you make the following change in your medication:   Increase Imdur to 60 mg daily.  *If you need a refill on your cardiac medications before your next appointment, please call your pharmacy*   Lab Work: None ordered If you have labs (blood work) drawn today and your tests are completely normal, you will receive your results only by: MyChart Message (if you have MyChart) OR A paper copy in the mail If you have any lab test that is abnormal or we need to change your treatment, we will call you to review the results.   Testing/Procedures: Your physician has recommended that you have a sleep study. This test records several body functions during sleep, including: brain activity, eye movement, oxygen and carbon dioxide blood levels, heart rate and rhythm, breathing rate and rhythm, the flow of air through your mouth and nose, snoring, body muscle movements, and chest and belly movement.    Follow-Up: At [facility], you and your health needs are our priority.  As part of our continuing mission to provide you with exceptional heart care, we have created designated Provider Care Teams.  These Care Teams include your primary Cardiologist (physician) and Advanced Practice Providers (APPs -  Physician Assistants and Nurse Practitioners) who all work together to provide you with the care you need, when you need it.  We recommend signing up for the patient portal called "MyChart".  Sign up information is provided on this After Visit Summary.  MyChart is used to connect with patients for Virtual Visits (Telemedicine).  Patients are able to view lab/test results, encounter notes, upcoming appointments, etc.  Non-urgent messages can be sent to your provider as well.   To learn more about what you can do with MyChart, go to [email or web address].    Your next appointment:   6 month(s)  The format for your next appointment:   In  Person  Provider:   Thomasene Ripple, MD   Other Instructions NA

## 2020-12-14 NOTE — Addendum Note (Signed)
Addended by: Aliyha Fornes, Elmarie Shiley L on: 12/14/2020 01:42 PM   Modules accepted: Orders

## 2020-12-14 NOTE — Progress Notes (Signed)
Cardiology Office Note:    Date:  12/14/2020   ID:  GRANT Rojas, DOB [birth year], MRN [id number]  PCP:  Randall Sofia, PA-C  Cardiologist:  Randall Ripple, DO  Electrophysiologist:  None   Referring MD: Randall Sofia, PA-C   " I am still short of breath with some chest pain"  History of Present Illness:    Randall Rojas is a 59 y.o. male with a hx of coronary artery disease with recent nuclear stress test showing no evidence of ischemia, recent TIA the patient is on dual antiplatelet therapy, hypertension, hyperlipidemia, chronic diastolic heart failure with his recent EF in May 2022 55-60%.   I last saw the patient on Aug 05, 2020, and prior to that visit the patient reported that he had been admitted twice at the [facility] hospital first for concern for stroke.  Both visits there was concern for TIA.  The second visit which was on July 11, 2020 the patient has significant headache along with left-sided weakness and numbness.  He was then admitted to the [facility] hospital.  During that time he underwent multiple testing modalities his MRI demonstrated no acute cranial abnormalities, his CTA showed 50% stenosis of the right ICA and less than 50% stenosis of the left ICA.  The patient did see neurology while he was in the hospital he was started on dual antiplatelet therapy for 3 months and then will transition to aspirin only.  He is now following with neurology.  He was started on statins as well.   At the conclusion of his Aug 05, 2020 visit he reported that he was experiencing some fluttering at therefore place a monitor on the patient.  At that time he also appeared to be fluid overloaded we restarted his Lasix.   Since I last saw the patient he had been admitted at [facility] hospital with a headache concerns that the patient may be experiencing multiple episodes of seizure.  He is being worked up by neurology there.  He is planning on undergoing a inpatient  continuous EEG monitoring with [facility].   I saw the patient in August 25, 2020 at that time we started him from Lasix to Bumex and he did not have any angina.  He ended up at [facility] where he was treated for heart failure and did have some chest pain which started Imdur on the patient.   On July 1 I saw the patient posthospitalization from [facility] where he was started on Imdur for chest pain.  He had had some rash on the Imdur so I stopped that and have the patient take ranolazine.   In July 2022he was hospitalized he had a inpatient stay at [facility] undergoing testing for for 3-day work-up for suspected seizures versus strokes.  He was told that he has many seizures but no epileptic seizures.  At his last visit October 27, 2018 with the patient experiencing significant chest pain with shortness of breath.  With this given his negative Lexiscan in April I sent the patient for right and left heart catheterization.  His catheterization essentially did show mild nonobstructive CAD unfortunately the right heart cath was not performed on sure why.  In the interim the patient called my office reporting that he was unable to continue with his Ranexa given the fact that his insurance company was not going to cover this medication.  We will stop that and put the patient back  on Imdur as he had not tolerated Imdur in the past.  But he was willing to try.  Today he is here with his wife.  He says he still experiencing chest pain.  He does not complain of any headaches from the Imdur.  He tells me he is working out a long-term disability application with his PCP.  He still is having significant shortness of breath as well.  Past Medical History:  Diagnosis Date   Acute laryngopharyngitis 11/11/2020   Acute URI 12/12/2019   Anxiety 06/17/2020   Arthralgia 06/17/2020   Asthma    Bilateral leg edema 06/23/2020   Chest pain 04/15/2015   Chronic diastolic heart failure  (HCC) 10/19/3662   Chronic heart failure with preserved ejection fraction (HCC) 07/11/2020   Coronary artery calcification seen on CT scan 04/15/2015   Coronary artery disease    Coronary artery disease involving native coronary artery of native heart without angina pectoris 07/11/2020   Coronary artery disease of native artery of native heart with stable angina pectoris (HCC) 06/23/2020   Essential hypertension 04/15/2015   Exertional dyspnea 05/26/2020   Frequent headaches 07/20/2020   GERD (gastroesophageal reflux disease) 05/27/2019   History of CVA in adulthood 08/05/2020   HLD (hyperlipidemia) 07/11/2020   Hyperglycemia 06/06/2015   Hyperlipidemia 04/15/2015   Hypertension 08/05/2020   Hypokalemia 07/20/2020   Left sided numbness 07/25/2020   Leukocytosis 06/06/2015   Malaise 06/17/2020   Mixed hyperlipidemia 04/15/2015   Morbid obesity (HCC) 04/15/2015   Need for prophylactic vaccination against Streptococcus pneumoniae (pneumococcus) 12/03/2019   Need for prophylactic vaccination and inoculation against influenza 12/03/2019   Need for tetanus booster 06/17/2020   Nonepileptic episode (HCC) 10/20/2020   Obesity, Class III, BMI 40-49.9 (morbid obesity) (HCC) 04/15/2015   Other sleep apnea 07/20/2020   Palpitations 08/05/2020   Pneumonia due to COVID-19 virus 04/06/2019   Prediabetes 06/23/2020   Prostate cancer screening 06/02/2019   Rectal bleeding 11/01/2020   Seizure-like activity (HCC) 05/26/2020   Shortness of breath 06/23/2020   TIA (transient ischemic attack)    Upper extremity weakness 05/26/2020   Weakness of left lower extremity 05/26/2020    Past Surgical History:  Procedure Laterality Date   CARDIAC CATHETERIZATION N/A 04/22/2015   Procedure: Left Heart Cath and Coronary Angiography;  Surgeon: Randall M Swaziland, MD;  Location: [facility] INVASIVE CV LAB;  Service: Cardiovascular;  Laterality: N/A;   GALLBLADDER SURGERY     LEFT HEART CATH AND CORONARY ANGIOGRAPHY N/A 10/28/2020   Procedure: LEFT HEART CATH AND  CORONARY ANGIOGRAPHY;  Surgeon: Lennette Bihari, MD;  Location: [facility] INVASIVE CV LAB;  Service: Cardiovascular;  Laterality: N/A;   SINUS SURGERY WITH INSTATRAK      Current Medications: Current Meds  Medication Sig   acetaminophen (TYLENOL) 650 MG CR tablet Take 650 mg by mouth every 8 (eight) hours as needed for pain.   albuterol (VENTOLIN HFA) 108 (90 Base) MCG/ACT inhaler Inhale 2 puffs into the lungs every 6 (six) hours as needed for wheezing or shortness of breath.   aspirin 325 MG EC tablet Take 325 mg by mouth daily.   baclofen (LIORESAL) 10 MG tablet Take 10-20 tablets by mouth every 6 (six) hours as needed (pain).   bumetanide (BUMEX) 2 MG tablet Take 1 tablet (2 mg total) by mouth 2 (two) times daily.   busPIRone (BUSPAR) 5 MG tablet Take 5 mg by mouth 3 (three) times daily.   cyanocobalamin 1000 MCG tablet Take 1,000 mcg by mouth  daily.   levETIRAcetam (KEPPRA) 750 MG tablet Take 750 mg by mouth in the morning and at bedtime.   nitroGLYCERIN (NITROSTAT) 0.4 MG SL tablet Place 0.4 mg under the tongue every 5 (five) minutes as needed for chest pain.   potassium chloride SA (KLOR-CON) 20 MEQ tablet Take 1 tablet (20 mEq total) by mouth 2 (two) times daily.   promethazine (PHENERGAN) 25 MG tablet Take 25 mg by mouth every 6 (six) hours as needed for nausea or vomiting.   rosuvastatin (CRESTOR) 10 MG tablet Take 1 tablet (10 mg total) by mouth daily.   [DISCONTINUED] isosorbide mononitrate (IMDUR) 30 MG 24 hr tablet Take 1 tablet (30 mg total) by mouth daily.     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  Tobacco Use   Smoking status: Former    Types: Cigarettes    Quit date: 04/13/1989    Years since quitting: 31.6    Smokeless tobacco: Never  Substance and Sexual Activity   Alcohol use: Not on file   Drug use: Not on file   Sexual activity: Not on file  Other Topics Concern   Not on file  Social History Narrative   Epworth Sleepiness Scale = 7 (as of 1/18/207)   Social Determinants of Health   Financial Resource Strain: Not on file  Food Insecurity: Not on file  Transportation Needs: Not on file  Physical Activity: Not on file  Stress: Not on file  Social Connections: Not on file     Family History: The patient's family history includes Cancer in his brother and father; Heart attack in his father; Hypertension in his mother and sister; Rheum arthritis in his mother.  ROS:   Review of Systems  Constitution: Negative for decreased appetite, fever and weight gain.  HENT: Negative for congestion, ear discharge, hoarse voice and sore throat.   Eyes: Negative for discharge, redness, vision loss in right eye and visual halos.  Cardiovascular: Negative for chest pain, dyspnea on exertion, leg swelling, orthopnea and palpitations.  Respiratory: Negative for cough, hemoptysis, shortness of breath and snoring.   Endocrine: Negative for heat intolerance and polyphagia.  Hematologic/Lymphatic: Negative for bleeding problem. Does not bruise/bleed easily.  Skin: Negative for flushing, nail changes, rash and suspicious lesions.  Musculoskeletal: Negative for arthritis, joint pain, muscle cramps, myalgias, neck pain and stiffness.  Gastrointestinal: Negative for abdominal pain, bowel incontinence, diarrhea and excessive appetite.  Genitourinary: Negative for decreased libido, genital sores and incomplete emptying.  Neurological: Negative for brief paralysis, focal weakness, headaches and loss of balance.  Psychiatric/Behavioral: Negative for altered mental status, depression and suicidal ideas.  Allergic/Immunologic: Negative for HIV exposure and persistent infections.    EKGs/Labs/Other Studies Reviewed:     The following studies were reviewed today:   EKG: None today   Left heart catheterization October 28, 2020 1st Diag lesion is 40% stenosed.   Mid LAD lesion is 10% stenosed.   The left ventricular systolic function is normal.   LV end diastolic pressure is mildly elevated.   The left ventricular ejection fraction is 50-55% by visual estimate.  Mild nonobstructive CAD with 40% proximal stenosis in the first diagonal branch of the LAD  with mild smooth luminal 10% mid LAD narrowing; normal left circumflex coronary artery; normal dominant RCA.   Low normal LV function with EF estimated 50 to 55% without focal segmental wall motion abnormality.  LVEDP 21 mm.   RECOMMENDATION: Medical therapy for mild nonobstructive CAD.  Aggressive lipid-lowering therapy with target LDL less than 70.   ZIO monitor Aug 13, 2020 Patch Wear Time:  3 days and 10 hours starting Aug 05, 2020 Indication: Palpitations   Patient had a min HR of 51 bpm, max HR of 120 bpm, and avg HR of 83 bpm.    Predominant underlying rhythm was Sinus Rhythm.   Premature ventricular complexes were rare.   Premature atrial complex were rare.   No ventricular tachycardia, no pauses, no supraventricular tachycardia no atrial fibrillation noted.   Symptoms associated with sinus rhythm.   Conclusion: Normal/unremarkable study with no evidence of significant arrhythmia  Recent Labs: 08/25/2020: Magnesium 2.1 09/24/2020: ALT 24; TSH 2.040 10/26/2020: BUN 12; Creatinine, Ser 1.02; Potassium 4.2; Sodium 142 11/01/2020: Hemoglobin 14.8; Platelets 155  Recent Lipid Panel    Component Value Date/Time   CHOL 105 09/24/2020 0926   TRIG 110 09/24/2020 0926   HDL 32 (L) 09/24/2020 0926   CHOLHDL 3.3 09/24/2020 0926   CHOLHDL 6.2 04/07/2019 0334   VLDL 16 04/07/2019 0334   LDLCALC 53 09/24/2020 0926    Physical Exam:    VS:  BP 128/84   Pulse 89   Ht 5\' 4"  (1.626 m)   Wt (!) 301 lb 12.8 oz (136.9 kg)   SpO2 96%   BMI 51.80  kg/m     Wt Readings from Last 3 Encounters:  12/14/20 (!) 301 lb 12.8 oz (136.9 kg)  11/11/20 (!) 305 lb (138.3 kg)  11/01/20 (!) 305 lb 12.8 oz (138.7 kg)     GEN: Well nourished, well developed in no acute distress HEENT: Normal NECK: No JVD; No carotid bruits LYMPHATICS: No lymphadenopathy CARDIAC: S1S2 noted,RRR, no murmurs, rubs, gallops RESPIRATORY:  Clear to auscultation without rales, wheezing or rhonchi  ABDOMEN: Soft, non-tender, non-distended, +bowel sounds, no guarding. EXTREMITIES: No edema, No cyanosis, no clubbing MUSCULOSKELETAL:  No deformity  SKIN: Warm and dry NEUROLOGIC:  Alert and oriented x 3, non-focal PSYCHIATRIC:  Normal affect, good insight  ASSESSMENT:    1. DOE (dyspnea on exertion)   2. Daytime somnolence   3. Coronary artery disease involving native coronary artery of native heart, unspecified whether angina present   4. Essential hypertension   5. TIA (transient ischemic attack)   6. Chronic heart failure with preserved ejection fraction (HCC)   7. Mixed hyperlipidemia   8. Obesity, Class III, BMI 40-49.9 (morbid obesity) (HCC)   9. Morbid obesity (HCC)   10. Prediabetes    PLAN:     1.  I discussed with the patient today that his shortness of breath could be multifactorial given his significant truncal obesity, in the setting of his morbid obesity and highly suspected untreated sleep apnea.  We will refer the patient to pulmonary for evaluation.  But in the meantime I will begin putting an order to start the process for his sleep study as he there is high suspicion for sleep apnea here.  2.  I am going to increase his Imdur.  But I explained to the patient that he had mild nonobstructive CAD which he understands explained to the patient what this means.  Blood pressure is acceptable, continue with current antihypertensive regimen.  Hyperlipidemia -  continue with current statin medication.  He follows neurology for his seizures.  The  patient understands the need to lose weight with diet and exercise. We have discussed specific strategies for this.  The patient is in agreement with the above plan. The patient left the office in stable condition.  The patient will follow up in   Medication Adjustments/Labs and Tests Ordered: Current medicines are reviewed at length with the patient today.  Concerns regarding medicines are outlined above.  Orders Placed This Encounter  Procedures   Ambulatory referral to Pulmonology   Split night study   Meds ordered this encounter  Medications   isosorbide mononitrate (IMDUR) 60 MG 24 hr tablet    Sig: Take 1 tablet (60 mg total) by mouth daily.    Dispense:  90 tablet    Refill:  3    Patient Instructions  Medication Instructions:  Your physician has recommended you make the following change in your medication:   Increase Imdur to 60 mg daily.  *If you need a refill on your cardiac medications before your next appointment, please call your pharmacy*   Lab Work: None ordered If you have labs (blood work) drawn today and your tests are completely normal, you will receive your results only by: MyChart Message (if you have MyChart) OR A paper copy in the mail If you have any lab test that is abnormal or we need to change your treatment, we will call you to review the results.   Testing/Procedures: Your physician has recommended that you have a sleep study. This test records several body functions during sleep, including: brain activity, eye movement, oxygen and carbon dioxide blood levels, heart rate and rhythm, breathing rate and rhythm, the flow of air through your mouth and nose, snoring, body muscle movements, and chest and belly movement.    Follow-Up: At [facility], you and your health needs are our priority.  As part of our continuing mission to provide you with exceptional heart care, we have created designated Provider Care Teams.  These Care Teams include your  primary Cardiologist (physician) and Advanced Practice Providers (APPs -  Physician Assistants and Nurse Practitioners) who all work together to provide you with the care you need, when you need it.  We recommend signing up for the patient portal called "MyChart".  Sign up information is provided on this After Visit Summary.  MyChart is used to connect with patients for Virtual Visits (Telemedicine).  Patients are able to view lab/test results, encounter notes, upcoming appointments, etc.  Non-urgent messages can be sent to your provider as well.   To learn more about what you can do with MyChart, go to [email or web address].    Your next appointment:   6 month(s)  The format for your next appointment:   In Person  Provider:   Thomasene Ripple, MD   Other Instructions NA     Adopting a Healthy Lifestyle.  Know what a healthy weight is for you (roughly BMI <25) and aim to maintain this   Aim for 7+ servings of fruits and vegetables daily   65-80+ fluid ounces of water or unsweet tea for healthy kidneys   Limit to max 1 drink of alcohol per day; avoid smoking/tobacco   Limit animal fats in diet for cholesterol and heart health - choose grass fed whenever available   Avoid highly processed foods, and foods high in saturated/trans fats   Aim for low stress - take time to unwind and care for your  mental health   Aim for 150 min of moderate intensity exercise weekly for heart health, and weights twice weekly for bone health   Aim for 7-9 hours of sleep daily   When it comes to diets, agreement about the perfect plan isnt easy to find, even among the experts. Experts at the [facility] of [employer] developed an idea known as the Healthy Eating Plate. Just imagine a plate divided into logical, healthy portions.   The emphasis is on diet quality:   Load up on vegetables and fruits - one-half of your plate: Aim for color and variety, and remember that potatoes dont count.    Go for whole grains - one-quarter of your plate: Whole wheat, barley, wheat berries, quinoa, oats, brown rice, and foods made with them. If you want pasta, go with whole wheat pasta.   Protein power - one-quarter of your plate: Fish, chicken, beans, and nuts are all healthy, versatile protein sources. Limit red meat.   The diet, however, does go beyond the plate, offering a few other suggestions.   Use healthy plant oils, such as olive, canola, soy, corn, sunflower and peanut. Check the labels, and avoid partially hydrogenated oil, which have unhealthy trans fats.   If youre thirsty, drink water. Coffee and tea are good in moderation, but skip sugary drinks and limit milk and dairy products to one or two daily servings.   The type of carbohydrate in the diet is more important than the amount. Some sources of carbohydrates, such as vegetables, fruits, whole grains, and beans-are healthier than others.   Finally, stay active  Signed, Randall Ripple, DO  12/14/2020 11:10 AM    [facility]

## 2020-12-14 NOTE — Addendum Note (Signed)
Addended by: Nikeya Maxim, Elmarie Shiley L on: 12/14/2020 11:14 AM   Modules accepted: Orders

## 2020-12-15 ENCOUNTER — Encounter: Payer: Self-pay | Admitting: Physician Assistant

## 2020-12-15 ENCOUNTER — Ambulatory Visit: Payer: 59 | Admitting: Physician Assistant

## 2020-12-15 ENCOUNTER — Other Ambulatory Visit: Payer: Self-pay

## 2020-12-15 ENCOUNTER — Telehealth: Payer: Self-pay | Admitting: *Deleted

## 2020-12-15 VITALS — BP 130/80 | HR 88 | Temp 97.3°F | Ht 64.0 in | Wt 303.2 lb

## 2020-12-15 DIAGNOSIS — M25531 Pain in right wrist: Secondary | ICD-10-CM | POA: Diagnosis not present

## 2020-12-15 DIAGNOSIS — Z23 Encounter for immunization: Secondary | ICD-10-CM

## 2020-12-15 NOTE — Telephone Encounter (Signed)
No PA required. Informed patient of upcoming home sleep study and patient understanding was verbalized.  Patient understands her/his HST is scheduled for 01/13/19/22 at 2. Pt is aware of testing date.

## 2020-12-15 NOTE — Progress Notes (Signed)
Acute Office Visit  Subjective:    Patient ID: Randall Rojas, male    DOB: May 24, 1960, 60 y.o.   MRN: [id number]  Chief Complaint  Patient presents with   Hand Pain    right    HPI Patient is in today for complaints of right wrist pain Pt had cardiac cath on 8/4 and right wrist was used for access - he states he wore a splint for a week after that but since then continues to have pain and soreness in right wrist and noted limited rom due to pain Denies numbness but at times does drop items  Pt would like flu shot today  Past Medical History:  Diagnosis Date   Acute laryngopharyngitis 11/11/2020   Acute URI 12/12/2019   Anxiety 06/17/2020   Arthralgia 06/17/2020   Asthma    Bilateral leg edema 06/23/2020   Chest pain 04/15/2015   Chronic diastolic heart failure ([place]) 08/05/2020   Chronic heart failure with preserved ejection fraction ([place]) 07/11/2020   Coronary artery calcification seen on CT scan 04/15/2015   Coronary artery disease    Coronary artery disease involving native coronary artery of native heart without angina pectoris 07/11/2020   Coronary artery disease of native artery of native heart with stable angina pectoris ([place]) 06/23/2020   Essential hypertension 04/15/2015   Exertional dyspnea 05/26/2020   Frequent headaches 07/20/2020   GERD (gastroesophageal reflux disease) 05/27/2019   History of CVA in adulthood 08/05/2020   HLD (hyperlipidemia) 07/11/2020   Hyperglycemia 06/06/2015   Hyperlipidemia 04/15/2015   Hypertension 08/05/2020   Hypokalemia 07/20/2020   Left sided numbness 07/25/2020   Leukocytosis 06/06/2015   Malaise 06/17/2020   Mixed hyperlipidemia 04/15/2015   Morbid obesity ([place]) 04/15/2015   Need for prophylactic vaccination against Streptococcus pneumoniae (pneumococcus) 12/03/2019   Need for prophylactic vaccination and inoculation against influenza 12/03/2019   Need for tetanus booster 06/17/2020   Nonepileptic episode ([place]) 10/20/2020   Obesity, Class III, BMI  40-49.9 (morbid obesity) ([place]) 04/15/2015   Other sleep apnea 07/20/2020   Palpitations 08/05/2020   Pneumonia due to COVID-19 virus 04/06/2019   Prediabetes 06/23/2020   Prostate cancer screening 06/02/2019   Rectal bleeding 11/01/2020   Seizure-like activity ([place]) 05/26/2020   Shortness of breath 06/23/2020   TIA (transient ischemic attack)    Upper extremity weakness 05/26/2020   Weakness of left lower extremity 05/26/2020    Past Surgical History:  Procedure Laterality Date   CARDIAC CATHETERIZATION N/A 04/22/2015   Procedure: Left Heart Cath and Coronary Angiography;  Surgeon: Peter M Martinique, MD;  Location: [place];  Service: Cardiovascular;  Laterality: N/A;   GALLBLADDER SURGERY     LEFT HEART CATH AND CORONARY ANGIOGRAPHY N/A 10/28/2020   Procedure: LEFT HEART CATH AND CORONARY ANGIOGRAPHY;  Surgeon: Troy Sine, MD;  Location: [place] [facility];  Service: Cardiovascular;  Laterality: N/A;   SINUS SURGERY WITH INSTATRAK      Family History  Problem Relation Age of Onset   Hypertension Mother    Rheum arthritis Mother    Cancer Father    Heart attack Father    Cancer Brother    Hypertension Sister     Social History   Socioeconomic History   Marital status: Married    Spouse name: Not on file   Number of children: Not on file   Years of education: Not on file   Highest education level: Not on file  Occupational History   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  Tobacco Use   Smoking status: Former    Types: Cigarettes    Quit date: 04/13/1989    Years since quitting: 31.6   Smokeless tobacco: Never  Substance and Sexual Activity   Alcohol use: Not on file   Drug use: Not on file   Sexual activity: Not on file  Other Topics Concern   Not on file  Social History Narrative   Epworth Sleepiness Scale = 7 (as of 1/18/207)   Social Determinants of Health   Financial Resource Strain: Not on file  Food Insecurity: Not on file  Transportation Needs: Not on file   Physical Activity: Not on file  Stress: Not on file  Social Connections: Not on file  Intimate Partner Violence: Not on file    Outpatient Medications Prior to Visit  Medication Sig Dispense Refill   acetaminophen (TYLENOL) 650 MG CR tablet Take 650 mg by mouth every 8 (eight) hours as needed for pain.     albuterol (VENTOLIN HFA) 108 (90 Base) MCG/ACT inhaler Inhale 2 puffs into the lungs every 6 (six) hours as needed for wheezing or shortness of breath. 8 g 0   aspirin 325 MG EC tablet Take 325 mg by mouth daily.     baclofen (LIORESAL) 10 MG tablet Take 10-20 tablets by mouth every 6 (six) hours as needed (pain).     bumetanide (BUMEX) 2 MG tablet Take 1 tablet (2 mg total) by mouth 2 (two) times daily. 180 tablet 3   busPIRone (BUSPAR) 5 MG tablet Take 5 mg by mouth 3 (three) times daily.     isosorbide mononitrate (IMDUR) 60 MG 24 hr tablet Take 1 tablet (60 mg total) by mouth daily. 90 tablet 3   levETIRAcetam (KEPPRA) 750 MG tablet Take 750 mg by mouth 2 (two) times daily. Take one tablet by mouth daily in the morning and two tablets by mouth daily in the evening.     nitroGLYCERIN (NITROSTAT) 0.4 MG SL tablet Place 0.4 mg under the tongue every 5 (five) minutes as needed for chest pain.     potassium chloride SA (KLOR-CON) 20 MEQ tablet Take 1 tablet (20 mEq total) by mouth 2 (two) times daily. 180 tablet 3   promethazine (PHENERGAN) 25 MG tablet Take 25 mg by mouth every 6 (six) hours as needed for nausea or vomiting.     Pyridoxine HCl (B-6 PO) Take 100 mg by mouth daily.     rosuvastatin (CRESTOR) 10 MG tablet Take 1 tablet (10 mg total) by mouth daily. 90 tablet 3   No facility-administered medications prior to visit.    Allergies  Allergen Reactions   Cephalexin Nausea And Vomiting and Rash   Ubrogepant Swelling    Review of Systems CONSTITUTIONAL: Negative for chills, fatigue, fever, unintentional weight gain and unintentional weight loss.  CARDIOVASCULAR: Negative  for chest pain, dizziness, palpitations and pedal edema.  RESPIRATORY: Negative for recent cough and dyspnea.  MSK: see HPI      Objective:    Physical Exam PHYSICAL EXAM:   VS: BP 130/80 (BP Location: Left Arm, Patient Position: Sitting, Cuff Size: Large)   Pulse 88   Temp (!) 97.3 F (36.3 C) (Temporal)   Ht 5' 4" (1.626 m)   Wt (!) 303 lb 3.2 oz (137.5 kg)   SpO2 92%   BMI 52.04 kg/m   GEN: Well nourished, well developed, in no acute distress  Cardiac: RRR; no murmurs, rubs, or gallops, Respiratory:  normal  respiratory rate and pattern with no distress - normal breath sounds with no rales, rhonchi, wheezes or rubs MS: tender to palpation medial and lateral aspects of right wrist - pain with rom   BP 130/80 (BP Location: Left Arm, Patient Position: Sitting, Cuff Size: Large)   Pulse 88   Temp (!) 97.3 F (36.3 C) (Temporal)   Ht 5' 4" (1.626 m)   Wt (!) 303 lb 3.2 oz (137.5 kg)   SpO2 92%   BMI 52.04 kg/m  Wt Readings from Last 3 Encounters:  12/15/20 (!) 303 lb 3.2 oz (137.5 kg)  12/14/20 (!) 301 lb 12.8 oz (136.9 kg)  11/11/20 (!) 305 lb (138.3 kg)    Health Maintenance Due  Topic Date Due   INFLUENZA VACCINE  10/25/2020    There are no preventive care reminders to display for this patient.   Lab Results  Component Value Date   TSH 2.040 09/24/2020   Lab Results  Component Value Date   WBC 8.3 11/01/2020   HGB 14.8 11/01/2020   HCT 44.9 11/01/2020   MCV 93 11/01/2020   PLT 155 11/01/2020   Lab Results  Component Value Date   NA 142 10/26/2020   K 4.2 10/26/2020   CO2 23 10/26/2020   GLUCOSE 99 10/26/2020   BUN 12 10/26/2020   CREATININE 1.02 10/26/2020   BILITOT 0.6 09/24/2020   ALKPHOS 104 09/24/2020   AST 18 09/24/2020   ALT 24 09/24/2020   PROT 6.5 09/24/2020   ALBUMIN 4.1 09/24/2020   CALCIUM 9.1 10/26/2020   ANIONGAP 11 04/07/2019   EGFR 85 10/26/2020   Lab Results  Component Value Date   CHOL 105 09/24/2020   Lab Results   Component Value Date   HDL 32 (L) 09/24/2020   Lab Results  Component Value Date   LDLCALC 53 09/24/2020   Lab Results  Component Value Date   TRIG 110 09/24/2020   Lab Results  Component Value Date   CHOLHDL 3.3 09/24/2020   Lab Results  Component Value Date   HGBA1C 6.3 (H) 09/24/2020       Assessment & Plan:  1. Wrist pain, right - Ambulatory referral to Physical Therapy  2. Need for prophylactic vaccination and inoculation against influenza - Flu Vaccine MDCK QUAD PF    No orders of the defined types were placed in this encounter.   Orders Placed This Encounter  Procedures   Flu Vaccine MDCK QUAD PF   Ambulatory referral to Physical Therapy      Follow-up: Return if symptoms worsen or fail to improve.  An After Visit Summary was printed and given to the patient.  Yetta Flock [facility] [phone]

## 2020-12-15 NOTE — Telephone Encounter (Signed)
No PA required. Informed patient of upcoming home sleep study and patient understanding was verbalized.  Patient understands her/his HST is scheduled for 01/13/19/22 at 2. Pt is aware of testing date.  

## 2020-12-22 ENCOUNTER — Telehealth: Payer: Self-pay | Admitting: *Deleted

## 2020-12-22 NOTE — Telephone Encounter (Signed)
-----   Message from [facility] sent at 12/14/2020 11:40 AM EDT ----- Regarding: FW: sleep study  ----- Message ----- From: Eleonore Chiquito, RN Sent: 12/14/2020  11:16 AM EDT To: [facility] Pre Cert/Auth Subject: sleep study                                    Pt needs a sleep study. Thanks Shonda

## 2020-12-22 NOTE — Telephone Encounter (Signed)
Prior Authorization for split night sleep study sent to [employer] via Phone. Reference # [id number].  Clinicals faxed to [phone].

## 2020-12-27 ENCOUNTER — Encounter: Payer: Self-pay | Admitting: Physician Assistant

## 2020-12-27 ENCOUNTER — Other Ambulatory Visit: Payer: Self-pay

## 2020-12-27 ENCOUNTER — Ambulatory Visit: Payer: 59 | Admitting: Physician Assistant

## 2020-12-27 VITALS — BP 130/76 | HR 73 | Temp 97.2°F | Ht 64.0 in | Wt 304.6 lb

## 2020-12-27 DIAGNOSIS — G459 Transient cerebral ischemic attack, unspecified: Secondary | ICD-10-CM

## 2020-12-27 DIAGNOSIS — M791 Myalgia, unspecified site: Secondary | ICD-10-CM

## 2020-12-27 DIAGNOSIS — K219 Gastro-esophageal reflux disease without esophagitis: Secondary | ICD-10-CM | POA: Diagnosis not present

## 2020-12-27 DIAGNOSIS — R600 Localized edema: Secondary | ICD-10-CM | POA: Diagnosis not present

## 2020-12-27 DIAGNOSIS — I10 Essential (primary) hypertension: Secondary | ICD-10-CM | POA: Diagnosis not present

## 2020-12-27 DIAGNOSIS — E782 Mixed hyperlipidemia: Secondary | ICD-10-CM | POA: Diagnosis not present

## 2020-12-27 MED ORDER — AMITRIPTYLINE HCL 25 MG PO TABS: 25.0000 mg | ORAL_TABLET | Freq: Every day | ORAL | 2 refills | Status: DC

## 2020-12-27 MED ORDER — OMEPRAZOLE 40 MG PO CPDR: 40.0000 mg | DELAYED_RELEASE_CAPSULE | Freq: Every day | ORAL | 3 refills | Status: DC

## 2020-12-27 NOTE — Progress Notes (Signed)
Established Patient Office Visit  Subjective:  Patient ID: Randall Rojas, male    DOB: [birth year]  Age: 60 y.o. MRN: [id number]  CC:  Chief Complaint  Patient presents with   Hypertension    HPI [name] presents for follow up of localized edema and hyperlipidemia  Mixed hyperlipidemia  Pt presents with hyperlipidemia. Compliance with treatment has been fair; The patient maintains a low cholesterol diet , follows up as directed ,  The patient denies experiencing any hypercholesterolemia related symptoms.  Evidenced based information based on history , exam, and other sources has been used  for decision making. Currently on crestor 37m qd  Pt has history of edema - he takes bumex 121mprn swelling along with potassiumm 2070m states symptoms are stable at this time  Pt continues to follow with neurology for history of headaches , history of TIA - he does follow up with them regularly and has upcoming appointment  Pt with history of joint pain - had been on tylenol and then restarted Voltaren however with history of seizures/possible TIA do recommend that he stop voltaren and restart tylenol as directed Will try elavil to help with pain instead along with tylenol  Pt with history of GERD - requests rx for omeprazole to help with intermittent symptoms - says worse with certain foods   Past Medical History:  Diagnosis Date   Acute laryngopharyngitis 11/11/2020   Acute URI 12/12/2019   Anxiety 06/17/2020   Arthralgia 06/17/2020   Asthma    Bilateral leg edema 06/23/2020   Chest pain 04/15/2015   Chronic diastolic heart failure ([place]/02/2021   Chronic heart failure with preserved ejection fraction ([facility]/17/2022   Coronary artery calcification seen on CT scan 04/15/2015   Coronary artery disease    Coronary artery disease involving native coronary artery of native heart without angina pectoris 07/11/2020   Coronary artery disease of native artery of native heart with stable  angina pectoris ([place]/30/2022   Essential hypertension 04/15/2015   Exertional dyspnea 05/26/2020   Frequent headaches 07/20/2020   GERD (gastroesophageal reflux disease) 05/27/2019   History of CVA in adulthood 08/05/2020   HLD (hyperlipidemia) 07/11/2020   Hyperglycemia 06/06/2015   Hyperlipidemia 04/15/2015   Hypertension 08/05/2020   Hypokalemia 07/20/2020   Left sided numbness 07/25/2020   Leukocytosis 06/06/2015   Malaise 06/17/2020   Mixed hyperlipidemia 04/15/2015   Morbid obesity ([place]/19/2017   Need for prophylactic vaccination against Streptococcus pneumoniae (pneumococcus) 12/03/2019   Need for prophylactic vaccination and inoculation against influenza 12/03/2019   Need for tetanus booster 06/17/2020   Nonepileptic episode ([place]/27/2022   Obesity, Class III, BMI 40-49.9 (morbid obesity) ([place]/19/2017   Other sleep apnea 07/20/2020   Palpitations 08/05/2020   Pneumonia due to COVID-19 virus 04/06/2019   Prediabetes 06/23/2020   Prostate cancer screening 06/02/2019   Rectal bleeding 11/01/2020   Seizure-like activity ([place]/04/2020   Shortness of breath 06/23/2020   TIA (transient ischemic attack)    Upper extremity weakness 05/26/2020   Weakness of left lower extremity 05/26/2020    Past Surgical History:  Procedure Laterality Date   CARDIAC CATHETERIZATION N/A 04/22/2015   Procedure: Left Heart Cath and Coronary Angiography;  Surgeon: Peter M JorMartiniqueD;  Location: [facility];  Service: Cardiovascular;  Laterality: N/A;   GALLBLADDER SURGERY     LEFT HEART CATH AND CORONARY ANGIOGRAPHY N/A 10/28/2020   Procedure: LEFT HEART CATH AND CORONARY ANGIOGRAPHY;  Surgeon: KelShelva Majestic  A, MD;  Location: [place] [facility];  Service: Cardiovascular;  Laterality: N/A;   SINUS SURGERY WITH INSTATRAK      Family History  Problem Relation Age of Onset   Hypertension Mother    Rheum arthritis Mother    Cancer Father    Heart attack Father    Cancer Brother    Hypertension Sister      Social History   Socioeconomic History   Marital status: Married    Spouse name: Not on file   Number of children: Not on file   Years of education: Not on file   Highest education level: Not on file  Occupational History   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  Tobacco Use   Smoking status: Former    Types: Cigarettes    Quit date: 04/13/1989    Years since quitting: 31.7   Smokeless tobacco: Never  Substance and Sexual Activity   Alcohol use: Not on file   Drug use: Not on file   Sexual activity: Not on file  Other Topics Concern   Not on file  Social History Narrative   Epworth Sleepiness Scale = 7 (as of 1/18/207)   Social Determinants of Health   Financial Resource Strain: Not on file  Food Insecurity: Not on file  Transportation Needs: Not on file  Physical Activity: Not on file  Stress: Not on file  Social Connections: Not on file  Intimate Partner Violence: Not on file     Current Outpatient Medications:    albuterol (VENTOLIN HFA) 108 (90 Base) MCG/ACT inhaler, Inhale 2 puffs into the lungs every 6 (six) hours as needed for wheezing or shortness of breath., Disp: 8 g, Rfl: 0   aspirin 325 MG EC tablet, Take 325 mg by mouth daily., Disp: , Rfl:    baclofen (LIORESAL) 10 MG tablet, Take 10-20 tablets by mouth every 6 (six) hours as needed (pain)., Disp: , Rfl:    bumetanide (BUMEX) 2 MG tablet, Take 1 tablet (2 mg total) by mouth 2 (two) times daily., Disp: 180 tablet, Rfl: 3   busPIRone (BUSPAR) 5 MG tablet, Take 5 mg by mouth 3 (three) times daily., Disp: , Rfl:    diclofenac (VOLTAREN) 75 MG EC tablet, Take 75 mg by mouth 2 (two) times daily., Disp: , Rfl:    isosorbide mononitrate (IMDUR) 60 MG 24 hr tablet, Take 1 tablet (60 mg total) by mouth daily., Disp: 90 tablet, Rfl: 3   levETIRAcetam (KEPPRA) 750 MG tablet, Take 750 mg by mouth 2 (two) times daily. Take one tablet by mouth daily in the morning and two tablets by mouth daily in the evening., Disp: , Rfl:     nitroGLYCERIN (NITROSTAT) 0.4 MG SL tablet, Place 0.4 mg under the tongue every 5 (five) minutes as needed for chest pain., Disp: , Rfl:    promethazine (PHENERGAN) 25 MG tablet, Take 25 mg by mouth every 6 (six) hours as needed for nausea or vomiting., Disp: , Rfl:    Pyridoxine HCl (B-6 PO), Take 100 mg by mouth daily., Disp: , Rfl:    rosuvastatin (CRESTOR) 10 MG tablet, Take 1 tablet (10 mg total) by mouth daily., Disp: 90 tablet, Rfl: 3   vitamin B-12 (CYANOCOBALAMIN) 1000 MCG tablet, Take 1,000 mcg by mouth daily., Disp: , Rfl:    potassium chloride SA (KLOR-CON) 20 MEQ tablet, Take 1 tablet (20 mEq total) by mouth 2 (two) times daily., Disp: 180 tablet, Rfl: 3   Allergies  Allergen Reactions  Cephalexin Nausea And Vomiting and Rash   Ubrogepant Swelling   CONSTITUTIONAL: Negative for chills, fatigue, fever, unintentional weight gain and unintentional weight loss.  E/N/T: Negative for ear pain, nasal congestion and sore throat.  CARDIOVASCULAR: Negative for chest pain, dizziness, palpitations and pedal edema.  RESPIRATORY: Negative for recent cough and dyspnea.  GASTROINTESTINAL: see HPI MSK: Negative for arthralgias and myalgias.  INTEGUMENTARY: Negative for rash.       Objective:  PHYSICAL EXAM:   VS: BP 130/76 (BP Location: Left Arm, Patient Position: Sitting, Cuff Size: Normal)   Pulse 73   Temp (!) 97.2 F (36.2 C) (Temporal)   Ht $R'5\' 4"'Xt$  (1.626 m)   Wt (!) 304 lb 9.6 oz (138.2 kg)   SpO2 92%   BMI 52.28 kg/m   GEN: Well nourished, well developed, in no acute distress  Cardiac: RRR; no murmurs, rubs, or gallops,no edema -  Respiratory:  normal respiratory rate and pattern with no distress - normal breath sounds with no rales, rhonchi, wheezes or rubs MS: no deformity or atrophy  Skin: warm and dry, no rash  Neuro:  Alert and Oriented x 3, Strength and sensation are intact - CN II-Xii grossly intact Psych: euthymic mood, appropriate affect and demeanor  Health  Maintenance Due  Topic Date Due   Zoster Vaccines- Shingrix (1 of 2) Never done     There are no preventive care reminders to display for this patient.  Lab Results  Component Value Date   TSH 2.040 09/24/2020   Lab Results  Component Value Date   WBC 8.3 11/01/2020   HGB 14.8 11/01/2020   HCT 44.9 11/01/2020   MCV 93 11/01/2020   PLT 155 11/01/2020   Lab Results  Component Value Date   NA 142 10/26/2020   K 4.2 10/26/2020   CO2 23 10/26/2020   GLUCOSE 99 10/26/2020   BUN 12 10/26/2020   CREATININE 1.02 10/26/2020   BILITOT 0.6 09/24/2020   ALKPHOS 104 09/24/2020   AST 18 09/24/2020   ALT 24 09/24/2020   PROT 6.5 09/24/2020   ALBUMIN 4.1 09/24/2020   CALCIUM 9.1 10/26/2020   ANIONGAP 11 04/07/2019   EGFR 85 10/26/2020   Lab Results  Component Value Date   CHOL 105 09/24/2020   Lab Results  Component Value Date   HDL 32 (L) 09/24/2020   Lab Results  Component Value Date   LDLCALC 53 09/24/2020   Lab Results  Component Value Date   TRIG 110 09/24/2020   Lab Results  Component Value Date   CHOLHDL 3.3 09/24/2020   Lab Results  Component Value Date   HGBA1C 6.3 (H) 09/24/2020      Assessment & Plan:   Problem List Items Addressed This Visit                                 Other   Mixed hyperlipidemia   Relevant Medications   isosorbide mononitrate (IMDUR) 15 mg TB24 24 hr tablet   Other Relevant Orders   Lipid panel Continue meds Watch diet   Hx TIA  Continue meds and follow up with neurology as directed  Myalgias Stop voltaren and restart tylenol Rx for elavil $RemoveBe'25mg'DPFTnQhNa$  qhs  GERD Rx for omeprazole as directed                         No orders of the defined types  were placed in this encounter.   Follow-up: Return in about 3 months (around 03/29/2021) for chronic fasting follow up.    SARA R Alexsander Cavins, PA-C

## 2020-12-28 LAB — CBC WITH DIFFERENTIAL/PLATELET
Basophils Absolute: 0.1 10*3/uL (ref 0.0–0.2)
Basos: 1 %
EOS (ABSOLUTE): 0.3 10*3/uL (ref 0.0–0.4)
Eos: 4 %
Hematocrit: 47 % (ref 37.5–51.0)
Hemoglobin: 15.7 g/dL (ref 13.0–17.7)
Immature Grans (Abs): 0 10*3/uL (ref 0.0–0.1)
Immature Granulocytes: 1 %
Lymphocytes Absolute: 3.1 10*3/uL (ref 0.7–3.1)
Lymphs: 38 %
MCH: 31.5 pg (ref 26.6–33.0)
MCHC: 33.4 g/dL (ref 31.5–35.7)
MCV: 94 fL (ref 79–97)
Monocytes Absolute: 0.5 10*3/uL (ref 0.1–0.9)
Monocytes: 6 %
Neutrophils Absolute: 4.3 10*3/uL (ref 1.4–7.0)
Neutrophils: 50 %
Platelets: 168 10*3/uL (ref 150–450)
RBC: 4.99 x10E6/uL (ref 4.14–5.80)
RDW: 13.6 % (ref 11.6–15.4)
WBC: 8.3 10*3/uL (ref 3.4–10.8)

## 2020-12-28 LAB — LIPID PANEL
Chol/HDL Ratio: 3.4 ratio (ref 0.0–5.0)
Cholesterol, Total: 123 mg/dL (ref 100–199)
HDL: 36 mg/dL — ABNORMAL LOW (ref >39)
LDL Chol Calc (NIH): 65 mg/dL (ref 0–99)
Triglycerides: 122 mg/dL (ref 0–149)
VLDL Cholesterol Cal: 22 mg/dL (ref 5–40)

## 2020-12-28 LAB — COMPREHENSIVE METABOLIC PANEL
ALT: 44 IU/L (ref 0–44)
AST: 29 IU/L (ref 0–40)
Albumin/Globulin Ratio: 2 (ref 1.2–2.2)
Albumin: 4.2 g/dL (ref 3.8–4.9)
Alkaline Phosphatase: 116 IU/L (ref 44–121)
BUN/Creatinine Ratio: 13 (ref 10–24)
BUN: 14 mg/dL (ref 8–27)
Bilirubin Total: 0.4 mg/dL (ref 0.0–1.2)
CO2: 26 mmol/L (ref 20–29)
Calcium: 8.8 mg/dL (ref 8.6–10.2)
Chloride: 104 mmol/L (ref 96–106)
Creatinine, Ser: 1.04 mg/dL (ref 0.76–1.27)
Globulin, Total: 2.1 g/dL (ref 1.5–4.5)
Glucose: 109 mg/dL — ABNORMAL HIGH (ref 70–99)
Potassium: 4.3 mmol/L (ref 3.5–5.2)
Sodium: 143 mmol/L (ref 134–144)
Total Protein: 6.3 g/dL (ref 6.0–8.5)
eGFR: 82 mL/min/{1.73_m2} (ref >59)

## 2020-12-28 LAB — CARDIOVASCULAR RISK ASSESSMENT

## 2020-12-29 NOTE — Telephone Encounter (Signed)
Split night approved from [facility] will change the orders from HST to Split night and call patient.

## 2020-12-29 NOTE — Telephone Encounter (Signed)
Received PA approval for split night sleep study from [facility]. Auth # [id number]. Valid dates  12/27/20 to 4 1 23.

## 2021-01-05 NOTE — Telephone Encounter (Signed)
Patient is scheduled for lab study on 02/14/21. Patient understands his sleep study will be done at [facility] sleep lab. Patient understands he will receive a sleep packet in a week or so. Patient understands to call if he does not receive the sleep packet in a timely manner. Patient agrees with treatment and thanked me for call.

## 2021-01-12 ENCOUNTER — Encounter (HOSPITAL_BASED_OUTPATIENT_CLINIC_OR_DEPARTMENT_OTHER): Payer: 59 | Admitting: Cardiology

## 2021-02-01 DIAGNOSIS — R569 Unspecified convulsions: Secondary | ICD-10-CM | POA: Diagnosis not present

## 2021-02-01 DIAGNOSIS — R69 Illness, unspecified: Secondary | ICD-10-CM | POA: Diagnosis not present

## 2021-02-01 DIAGNOSIS — G44029 Chronic cluster headache, not intractable: Secondary | ICD-10-CM | POA: Diagnosis not present

## 2021-02-14 ENCOUNTER — Encounter (HOSPITAL_BASED_OUTPATIENT_CLINIC_OR_DEPARTMENT_OTHER): Payer: 59 | Admitting: Cardiology

## 2021-02-25 ENCOUNTER — Telehealth: Payer: Self-pay | Admitting: Cardiology

## 2021-02-25 NOTE — Telephone Encounter (Signed)
Pt c/o medication issue:  1. Name of Medication: isosorbide mononitrate (IMDUR) 60 MG 24 hr tablet  2. How are you currently taking this medication (dosage and times per day)? 1 tablet twice a day  3. Are you having a reaction (difficulty breathing--STAT)?   4. What is your medication issue? Patient states he has stopped taking the medication, because it gives him headaches. He states he first stopped taking it for 3-4 days and his headaches went away. He says he then took 1 tablet in the morning and the headaches came back.

## 2021-02-28 ENCOUNTER — Other Ambulatory Visit: Payer: Self-pay

## 2021-02-28 NOTE — Telephone Encounter (Signed)
Called pt to give him Dr. Mallory Shirk recommendations,. No answer at this time. Left message for him to return the call.

## 2021-03-03 ENCOUNTER — Other Ambulatory Visit: Payer: Self-pay

## 2021-03-03 MED ORDER — RANOLAZINE ER 500 MG PO TB12: 500.0000 mg | ORAL_TABLET | Freq: Two times a day (BID) | ORAL | 0 refills | Status: DC

## 2021-03-03 NOTE — Telephone Encounter (Signed)
Called pt, he states himself and his wife have been under the weather. Prescription for Ranexa 500 mg twice daily has been sent in for the pt, 30 day supply sent.

## 2021-03-07 ENCOUNTER — Other Ambulatory Visit: Payer: Self-pay | Admitting: Physician Assistant

## 2021-03-07 MED ORDER — AZITHROMYCIN 250 MG PO TABS: ORAL_TABLET | ORAL | 0 refills | Status: AC

## 2021-03-08 ENCOUNTER — Telehealth: Payer: Self-pay

## 2021-03-08 NOTE — Telephone Encounter (Signed)
Prior Auth for Ranexa completed, determination pending, Confirmation #[id number]

## 2021-03-10 ENCOUNTER — Telehealth: Payer: Self-pay

## 2021-03-10 NOTE — Telephone Encounter (Signed)
Prior Auth for Ranexa approved. Confirmation number: (Key: [id number] - [id number]

## 2021-03-10 NOTE — Telephone Encounter (Signed)
Prior Auth needed additional documentation, this was sent today. Patient notified and advise to contact his pharmacy tomorrow for a status

## 2021-03-10 NOTE — Telephone Encounter (Signed)
Pt is f/u on PA authorization for Renexa. Pt can be contacted at [phone]

## 2021-03-25 ENCOUNTER — Ambulatory Visit (HOSPITAL_BASED_OUTPATIENT_CLINIC_OR_DEPARTMENT_OTHER): Payer: 59 | Attending: Cardiology | Admitting: Cardiology

## 2021-03-25 ENCOUNTER — Other Ambulatory Visit: Payer: Self-pay

## 2021-03-25 DIAGNOSIS — G4733 Obstructive sleep apnea (adult) (pediatric): Secondary | ICD-10-CM | POA: Insufficient documentation

## 2021-03-25 DIAGNOSIS — R0609 Other forms of dyspnea: Secondary | ICD-10-CM

## 2021-03-25 DIAGNOSIS — R0600 Dyspnea, unspecified: Secondary | ICD-10-CM | POA: Diagnosis not present

## 2021-03-25 DIAGNOSIS — R400 Somnolence: Secondary | ICD-10-CM | POA: Diagnosis not present

## 2021-03-31 ENCOUNTER — Ambulatory Visit: Payer: 59 | Admitting: Physician Assistant

## 2021-03-31 ENCOUNTER — Ambulatory Visit: Payer: 59 | Admitting: Legal Medicine

## 2021-03-31 ENCOUNTER — Encounter: Payer: Self-pay | Admitting: Physician Assistant

## 2021-03-31 VITALS — BP 118/72 | HR 84 | Temp 97.7°F | Ht 64.0 in | Wt 289.2 lb

## 2021-03-31 DIAGNOSIS — J060 Acute laryngopharyngitis: Secondary | ICD-10-CM

## 2021-03-31 LAB — POCT INFLUENZA A/B
Influenza A, POC: NEGATIVE
Influenza B, POC: NEGATIVE

## 2021-03-31 LAB — POC COVID19 BINAXNOW: SARS Coronavirus 2 Ag: NEGATIVE

## 2021-03-31 MED ORDER — CLARITHROMYCIN 500 MG PO TABS: 500.0000 mg | ORAL_TABLET | Freq: Two times a day (BID) | ORAL | 0 refills | Status: DC

## 2021-03-31 MED ORDER — BENZONATATE 100 MG PO CAPS
100.0000 mg | ORAL_CAPSULE | Freq: Two times a day (BID) | ORAL | 0 refills | Status: DC | PRN
Start: 2021-03-31 — End: 2021-04-18

## 2021-03-31 NOTE — Progress Notes (Signed)
Acute Office Visit  Subjective:    Patient ID: Randall Rojas, male    DOB: 01/06/1961, 61 y.o.   MRN: [id number]  Chief Complaint  Patient presents with   Cough   Nasal Congestion    HPI: Patient is in today for complaints of cough, congestion, pnd for the past 2 weeks - was treated for sinus infection and did feel better but now with chest congestion and a productive cough  Past Medical History:  Diagnosis Date   Acute laryngopharyngitis 11/11/2020   Acute URI 12/12/2019   Anxiety 06/17/2020   Arthralgia 06/17/2020   Asthma    Bilateral leg edema 06/23/2020   Chest pain 04/15/2015   Chronic diastolic heart failure ([place]) 08/05/2020   Chronic heart failure with preserved ejection fraction ([place]) 07/11/2020   Coronary artery calcification seen on CT scan 04/15/2015   Coronary artery disease    Coronary artery disease involving native coronary artery of native heart without angina pectoris 07/11/2020   Coronary artery disease of native artery of native heart with stable angina pectoris ([place]) 06/23/2020   Essential hypertension 04/15/2015   Exertional dyspnea 05/26/2020   Frequent headaches 07/20/2020   GERD (gastroesophageal reflux disease) 05/27/2019   History of CVA in adulthood 08/05/2020   HLD (hyperlipidemia) 07/11/2020   Hyperglycemia 06/06/2015   Hyperlipidemia 04/15/2015   Hypertension 08/05/2020   Hypokalemia 07/20/2020   Left sided numbness 07/25/2020   Leukocytosis 06/06/2015   Malaise 06/17/2020   Mixed hyperlipidemia 04/15/2015   Morbid obesity ([place]) 04/15/2015   Need for prophylactic vaccination against Streptococcus pneumoniae (pneumococcus) 12/03/2019   Need for prophylactic vaccination and inoculation against influenza 12/03/2019   Need for tetanus booster 06/17/2020   Nonepileptic episode ([place]) 10/20/2020   Obesity, Class III, BMI 40-49.9 (morbid obesity) ([place]) 04/15/2015   Other sleep apnea 07/20/2020   Palpitations 08/05/2020   Pneumonia due to COVID-19 virus 04/06/2019   Prediabetes  06/23/2020   Prostate cancer screening 06/02/2019   Rectal bleeding 11/01/2020   Seizure-like activity ([place]) 05/26/2020   Shortness of breath 06/23/2020   TIA (transient ischemic attack)    Upper extremity weakness 05/26/2020   Weakness of left lower extremity 05/26/2020    Past Surgical History:  Procedure Laterality Date   CARDIAC CATHETERIZATION N/A 04/22/2015   Procedure: Left Heart Cath and Coronary Angiography;  Surgeon: Peter M Martinique, MD;  Location: [facility];  Service: Cardiovascular;  Laterality: N/A;   GALLBLADDER SURGERY     LEFT HEART CATH AND CORONARY ANGIOGRAPHY N/A 10/28/2020   Procedure: LEFT HEART CATH AND CORONARY ANGIOGRAPHY;  Surgeon: Troy Sine, MD;  Location: [facility];  Service: Cardiovascular;  Laterality: N/A;   SINUS SURGERY WITH INSTATRAK      Family History  Problem Relation Age of Onset   Hypertension Mother    Rheum arthritis Mother    Cancer Father    Heart attack Father    Cancer Brother    Hypertension Sister     Social History   Socioeconomic History   Marital status: Married    Spouse name: Not on file   Number of children: Not on file   Years of education: Not on file   Highest education level: Not on file  Occupational History   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  Tobacco Use   Smoking status: Former    Types: Cigarettes    Quit date: 04/13/1989    Years since quitting: 31.9   Smokeless tobacco: Never  Substance  and Sexual Activity   Alcohol use: Not on file   Drug use: Not on file   Sexual activity: Not on file  Other Topics Concern   Not on file  Social History Narrative   Epworth Sleepiness Scale = 7 (as of 1/18/207)   Social Determinants of Health   Financial Resource Strain: Not on file  Food Insecurity: Not on file  Transportation Needs: Not on file  Physical Activity: Not on file  Stress: Not on file  Social Connections: Not on file  Intimate Partner Violence: Not on file    Outpatient Medications Prior to  Visit  Medication Sig Dispense Refill   albuterol (VENTOLIN HFA) 108 (90 Base) MCG/ACT inhaler Inhale 2 puffs into the lungs every 6 (six) hours as needed for wheezing or shortness of breath. 8 g 0   amitriptyline (ELAVIL) 25 MG tablet Take 1 tablet (25 mg total) by mouth at bedtime. 30 tablet 2   aspirin 325 MG EC tablet Take 325 mg by mouth daily.     baclofen (LIORESAL) 10 MG tablet Take 10-20 tablets by mouth every 6 (six) hours as needed (pain).     bumetanide (BUMEX) 2 MG tablet Take 1 tablet (2 mg total) by mouth 2 (two) times daily. 180 tablet 3   busPIRone (BUSPAR) 5 MG tablet Take 5 mg by mouth 3 (three) times daily.     levETIRAcetam (KEPPRA) 750 MG tablet Take 750 mg by mouth 2 (two) times daily. Take one tablet by mouth daily in the morning and two tablets by mouth daily in the evening.     nitroGLYCERIN (NITROSTAT) 0.4 MG SL tablet Place 0.4 mg under the tongue every 5 (five) minutes as needed for chest pain.     omeprazole (PRILOSEC) 40 MG capsule Take 1 capsule (40 mg total) by mouth daily. 30 capsule 3   promethazine (PHENERGAN) 25 MG tablet Take 25 mg by mouth every 6 (six) hours as needed for nausea or vomiting.     Pyridoxine HCl (B-6 PO) Take 100 mg by mouth daily.     ranolazine (RANEXA) 500 MG 12 hr tablet Take 1 tablet (500 mg total) by mouth 2 (two) times daily. 60 tablet 0   vitamin B-12 (CYANOCOBALAMIN) 1000 MCG tablet Take 1,000 mcg by mouth daily.     potassium chloride SA (KLOR-CON) 20 MEQ tablet Take 1 tablet (20 mEq total) by mouth 2 (two) times daily. 180 tablet 3   rosuvastatin (CRESTOR) 10 MG tablet Take 1 tablet (10 mg total) by mouth daily. 90 tablet 3   No facility-administered medications prior to visit.    Allergies  Allergen Reactions   Cephalexin Nausea And Vomiting and Rash   Ubrogepant Swelling    Review of Systems CONSTITUTIONAL: Negative for chills, fatigue, fever, unintentional weight gain and unintentional weight loss.  E/N/T:see  HPI CARDIOVASCULAR: Negative for chest pain, dizziness, palpitations and pedal edema.  RESPIRATORY: see HPI  NEUROLOGICAL: Negative for dizziness and headaches.          Objective:    Physical Exam PHYSICAL EXAM:   VS: BP 118/72 (BP Location: Right Arm, Patient Position: Sitting, Cuff Size: Large)    Pulse 84    Temp 97.7 F (36.5 C) (Temporal)    Ht $R'5\' 4"'el$  (1.626 m)    Wt 289 lb 3.2 oz (131.2 kg)    SpO2 94%    BMI 49.64 kg/m   GEN: Well nourished, well developed, in no acute distress  HEENT: normal external  ears and nose - normal external auditory canals and TMS - - Lips, Teeth and Gums - normal  Oropharynx - erythema/pnd Cardiac: RRR; no murmurs,  Respiratory:  faint rhonchi - clears with cough Psych: euthymic mood, appropriate affect and demeanor  Office Visit on 03/31/2021  Component Date Value Ref Range Status   SARS Coronavirus 2 Ag 03/31/2021 Negative  Negative Final   Influenza A, POC 03/31/2021 Negative  Negative Final   Influenza B, POC 03/31/2021 Negative  Negative Final    BP 118/72 (BP Location: Right Arm, Patient Position: Sitting, Cuff Size: Large)    Pulse 84    Temp 97.7 F (36.5 C) (Temporal)    Ht $R'5\' 4"'qE$  (1.626 m)    Wt 289 lb 3.2 oz (131.2 kg)    SpO2 94%    BMI 49.64 kg/m  Wt Readings from Last 3 Encounters:  03/31/21 289 lb 3.2 oz (131.2 kg)  03/25/21 280 lb (127 kg)  12/27/20 (!) 304 lb 9.6 oz (138.2 kg)    Health Maintenance Due  Topic Date Due   Zoster Vaccines- Shingrix (1 of 2) Never done    There are no preventive care reminders to display for this patient.   Lab Results  Component Value Date   TSH 2.040 09/24/2020   Lab Results  Component Value Date   WBC 8.3 12/27/2020   HGB 15.7 12/27/2020   HCT 47.0 12/27/2020   MCV 94 12/27/2020   PLT 168 12/27/2020   Lab Results  Component Value Date   NA 143 12/27/2020   K 4.3 12/27/2020   CO2 26 12/27/2020   GLUCOSE 109 (H) 12/27/2020   BUN 14 12/27/2020   CREATININE 1.04  12/27/2020   BILITOT 0.4 12/27/2020   ALKPHOS 116 12/27/2020   AST 29 12/27/2020   ALT 44 12/27/2020   PROT 6.3 12/27/2020   ALBUMIN 4.2 12/27/2020   CALCIUM 8.8 12/27/2020   ANIONGAP 11 04/07/2019   EGFR 82 12/27/2020   Lab Results  Component Value Date   CHOL 123 12/27/2020   Lab Results  Component Value Date   HDL 36 (L) 12/27/2020   Lab Results  Component Value Date   LDLCALC 65 12/27/2020   Lab Results  Component Value Date   TRIG 122 12/27/2020   Lab Results  Component Value Date   CHOLHDL 3.4 12/27/2020   Lab Results  Component Value Date   HGBA1C 6.3 (H) 09/24/2020       Assessment & Plan:   Problem List Items Addressed This Visit       Respiratory   Acute laryngopharyngitis - Primary   Relevant Medications   clarithromycin (BIAXIN) 500 MG tablet   benzonatate (TESSALON) 100 MG capsule   Other Relevant Orders   POC COVID-19 BinaxNow (Completed)   POCT Influenza A/B (Completed)   Meds ordered this encounter  Medications   clarithromycin (BIAXIN) 500 MG tablet    Sig: Take 1 tablet (500 mg total) by mouth 2 (two) times daily.    Dispense:  20 tablet    Refill:  0    Order Specific Question:   Supervising Provider    Answer:   Shelton Silvas   benzonatate (TESSALON) 100 MG capsule    Sig: Take 1 capsule (100 mg total) by mouth 2 (two) times daily as needed for cough.    Dispense:  20 capsule    Refill:  0    Order Specific Question:   Supervising Provider    Answer:  COX, KIRSTEN [id number]    Orders Placed This Encounter  Procedures   POC COVID-19 BinaxNow   POCT Influenza A/B     Follow-up: Return if symptoms worsen or fail to improve.  An After Visit Summary was printed and given to the patient.  Yetta Flock [facility] [phone]

## 2021-04-03 NOTE — Procedures (Signed)
Patient Name: Randall Rojas, [name] Date: 03/25/2021 Gender: Male D.O.B: 1960/10/07 Age (years): 60 Referring Provider: Adron Bene PA-C Height (inches): 93 Interpreting Physician: Fransico Him MD, ABSM Weight (lbs): 280 RPSGT: Earney [place] BMI: 48 MRN: [id number] Neck Size: 22.00  CLINICAL INFORMATION Sleep Study Type: Split Night CPAP  Indication for sleep study: OSA  Epworth Sleepiness Score: 2  SLEEP STUDY TECHNIQUE As per the AASM Manual for the Scoring of Sleep and Associated Events v2.3 (April 2016) with a hypopnea requiring 4% desaturations.  The channels recorded and monitored were frontal, central and occipital EEG, electrooculogram (EOG), submentalis EMG (chin), nasal and oral airflow, thoracic and abdominal wall motion, anterior tibialis EMG, snore microphone, electrocardiogram, and pulse oximetry. Continuous positive airway pressure (CPAP) was initiated when the patient met split night criteria and was titrated according to treat sleep-disordered breathing.  MEDICATIONS Medications self-administered by patient taken the night of the study : KEPPRA, ZOLOFT, ranolazine  RESPIRATORY PARAMETERS Diagnostic Total AHI (/hr): 29.7  RDI (/hr):35.1  OA Index (/hr): 3.2  CA Index (/hr): 0.0 REM AHI (/hr): N/A  NREM AHI (/hr):29.7  Supine AHI (/hr):29.7  Non-supine AHI (/hr):0 Min O2 Sat (%):81.0  Mean O2 (%):88.4  Time below 88% (min):73.7   Titration Optimal Pressure (cm):13  AHI at Optimal Pressure (/hr):0  Min O2 at Optimal Pressure (%):91.0 Supine % at Optimal (%):100 Sleep % at Optimal (%):3   SLEEP ARCHITECTURE The recording time for the entire night was 361.5 minutes.  During a baseline period of 171.0 minutes, the patient slept for 131.5 minutes in REM and nonREM, yielding a sleep efficiency of 76.9%. Sleep onset after lights out was 4.3 minutes with a REM latency of N/A minutes. The patient spent 3.8% of the night in stage N1 sleep, 76.0% in stage  N2 sleep, 20.2% in stage N3 and 0% in REM.  During the titration period of 183.6 minutes, the patient slept for 128.5 minutes in REM and nonREM, yielding a sleep efficiency of 70.0%. Sleep onset after CPAP initiation was 19.4 minutes with a REM latency of 107.0 minutes. The patient spent 0.8% of the night in stage N1 sleep, 36.2% in stage N2 sleep, 42.0% in stage N3 and 21% in REM.  CARDIAC DATA The 2 lead EKG demonstrated sinus rhythm. The mean heart rate was 100.0 beats per minute. Other EKG findings include: None.  LEG MOVEMENT DATA The total Periodic Limb Movements of Sleep (PLMS) were 0. The PLMS index was 0.0 .  IMPRESSIONS - Severe obstructive sleep apnea occurred during the diagnostic portion of the study(AHI = 29.7/hour). An optimal PAP pressure was selected for this patient ( 13 cm of water) - Severe oxygen desaturation was noted during the diagnostic portion of the study (Min O2 = 81.0%). - The patient snored with moderate snoring volume during the diagnostic portion of the study. - No cardiac abnormalities were noted during this study. - Clinically significant periodic limb movements did not occur during sleep.  DIAGNOSIS - Obstructive Sleep Apnea (G47.33)  RECOMMENDATIONS - Trial of CPAP therapy on 13 cm H2O with a Small size Fisher[employer]Paykel Full Face Mask Simplus mask and heated humidification. - Avoid alcohol, sedatives and other CNS depressants that may worsen sleep apnea and disrupt normal sleep architecture. - Sleep hygiene should be reviewed to assess factors that may improve sleep quality. - Weight management and regular exercise should be initiated or continued. - Return to Sleep Center for re-evaluation after 6 weeks of therapy  [Electronically signed] 04/03/2021 08:30 PM  Fransico Him MD, ABSM Diplomate, American Board of Sleep Medicine

## 2021-04-09 ENCOUNTER — Encounter: Payer: Self-pay | Admitting: Physician Assistant

## 2021-04-12 NOTE — Telephone Encounter (Signed)
Think Dr Servando Salina ordered this - he will need to contact her office

## 2021-04-14 ENCOUNTER — Ambulatory Visit: Payer: 59 | Admitting: Physician Assistant

## 2021-04-15 ENCOUNTER — Encounter: Payer: Self-pay | Admitting: Cardiology

## 2021-04-15 ENCOUNTER — Ambulatory Visit: Payer: 59 | Admitting: Physician Assistant

## 2021-04-15 ENCOUNTER — Telehealth: Payer: Self-pay | Admitting: *Deleted

## 2021-04-15 DIAGNOSIS — G4739 Other sleep apnea: Secondary | ICD-10-CM

## 2021-04-15 NOTE — Telephone Encounter (Signed)
Per dr Mayford Knife, Trial of CPAP therapy on 13 cm H2O with a Small size Fisher[employer]Paykel Full Face Mask Simplus mask and heated humidification.  Upon patient request DME selection is [facility] Patient understands he will be contacted by [facility] to set up his cpap. Patient understands to call if [facility] does not contact him with new setup in a timely manner. Patient understands they will be called once confirmation has been received from [facility]/ that they have received their new machine to schedule 10 week follow up appointment.   [facility] notified of new cpap order  Please add to airview Patient was grateful for the call and thanked me

## 2021-04-18 ENCOUNTER — Ambulatory Visit: Payer: 59 | Admitting: Physician Assistant

## 2021-04-18 ENCOUNTER — Other Ambulatory Visit: Payer: Self-pay

## 2021-04-18 ENCOUNTER — Encounter: Payer: Self-pay | Admitting: Physician Assistant

## 2021-04-18 ENCOUNTER — Other Ambulatory Visit: Payer: Self-pay | Admitting: Cardiology

## 2021-04-18 VITALS — BP 118/72 | HR 77 | Temp 97.1°F | Ht 64.0 in | Wt 286.0 lb

## 2021-04-18 DIAGNOSIS — R739 Hyperglycemia, unspecified: Secondary | ICD-10-CM | POA: Diagnosis not present

## 2021-04-18 DIAGNOSIS — E782 Mixed hyperlipidemia: Secondary | ICD-10-CM

## 2021-04-18 DIAGNOSIS — I10 Essential (primary) hypertension: Secondary | ICD-10-CM | POA: Diagnosis not present

## 2021-04-18 DIAGNOSIS — R202 Paresthesia of skin: Secondary | ICD-10-CM | POA: Diagnosis not present

## 2021-04-18 DIAGNOSIS — R5381 Other malaise: Secondary | ICD-10-CM | POA: Diagnosis not present

## 2021-04-18 NOTE — Progress Notes (Signed)
Established Patient Office Visit  Subjective:  Patient ID: Randall Rojas, male    DOB: 1961-02-01  Age: 61 y.o. MRN: [id number]  CC:  Chief Complaint  Patient presents with   Hypertension   Hyperlipidemia    HPI Randall Rojas presents for follow up of hypertension and hyperlipidemia  Mixed hyperlipidemia  Pt presents with hyperlipidemia. Compliance with treatment has been fair; The patient maintains a low cholesterol diet , follows up as directed ,  The patient denies experiencing any hypercholesterolemia related symptoms.  Evidenced based information based on history , exam, and other sources has been used  for decision making. Currently on crestor $RemoveBe'10mg'iYSbmHzuw$  qd  Pt has history of edema - he takes bumex $RemoveBef'2mg'ZxlKYzVqsT$  prn swelling along with potassiumm 36meq- states symptoms are stable at this time  Pt continues to follow with neurology for history of headaches , history of TIA - he does follow up with them regularly and has upcoming appointment next week --- he is currently on keppra and they also started zoloft $RemoveBefore'100mg'WpEKYwsphkFjL$  qd  Pt with history of joint pain - pt currently using tylenol and elavil - symptoms stable  Pt with history of GERD - pt using omeprazole as directed  Pt sees cardiology Dr Harriet Masson on a regular basis  - He recently had a sleep study and awaiting those results Pt with history of CAD - currently on ASA $Remo'325mg'cTrAp$  qd and ranexa $RemoveBe'500mg'REYAoNDxP$    Past Medical History:  Diagnosis Date   Acute laryngopharyngitis 11/11/2020   Acute URI 12/12/2019   Anxiety 06/17/2020   Arthralgia 06/17/2020   Asthma    Bilateral leg edema 06/23/2020   Chest pain 04/15/2015   Chronic diastolic heart failure ([place]) 08/05/2020   Chronic heart failure with preserved ejection fraction ([place]) 07/11/2020   Coronary artery calcification seen on CT scan 04/15/2015   Coronary artery disease    Coronary artery disease involving native coronary artery of native heart without angina pectoris 07/11/2020   Coronary artery disease of  native artery of native heart with stable angina pectoris ([place]) 06/23/2020   Essential hypertension 04/15/2015   Exertional dyspnea 05/26/2020   Frequent headaches 07/20/2020   GERD (gastroesophageal reflux disease) 05/27/2019   History of CVA in adulthood 08/05/2020   HLD (hyperlipidemia) 07/11/2020   Hyperglycemia 06/06/2015   Hyperlipidemia 04/15/2015   Hypertension 08/05/2020   Hypokalemia 07/20/2020   Left sided numbness 07/25/2020   Leukocytosis 06/06/2015   Malaise 06/17/2020   Mixed hyperlipidemia 04/15/2015   Morbid obesity ([place]) 04/15/2015   Need for prophylactic vaccination against Streptococcus pneumoniae (pneumococcus) 12/03/2019   Need for prophylactic vaccination and inoculation against influenza 12/03/2019   Need for tetanus booster 06/17/2020   Nonepileptic episode ([place]) 10/20/2020   Obesity, Class III, BMI 40-49.9 (morbid obesity) ([place]) 04/15/2015   Other sleep apnea 07/20/2020   Palpitations 08/05/2020   Pneumonia due to COVID-19 virus 04/06/2019   Prediabetes 06/23/2020   Prostate cancer screening 06/02/2019   Rectal bleeding 11/01/2020   Seizure-like activity ([place]) 05/26/2020   Shortness of breath 06/23/2020   TIA (transient ischemic attack)    Upper extremity weakness 05/26/2020   Weakness of left lower extremity 05/26/2020    Past Surgical History:  Procedure Laterality Date   CARDIAC CATHETERIZATION N/A 04/22/2015   Procedure: Left Heart Cath and Coronary Angiography;  Surgeon: Peter M Martinique, MD;  Location: [facility];  Service: Cardiovascular;  Laterality: N/A;   GALLBLADDER SURGERY     LEFT HEART CATH AND CORONARY  ANGIOGRAPHY N/A 10/28/2020   Procedure: LEFT HEART CATH AND CORONARY ANGIOGRAPHY;  Surgeon: Troy Sine, MD;  Location: [facility];  Service: Cardiovascular;  Laterality: N/A;   SINUS SURGERY WITH INSTATRAK      Family History  Problem Relation Age of Onset   Hypertension Mother    Rheum arthritis Mother    Cancer Father    Heart attack Father    Cancer  Brother    Hypertension Sister     Social History   Socioeconomic History   Marital status: Married    Spouse name: Not on file   Number of children: Not on file   Years of education: Not on file   Highest education level: Not on file  Occupational History   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  Tobacco Use   Smoking status: Former    Types: Cigarettes    Quit date: 04/13/1989    Years since quitting: 32.0   Smokeless tobacco: Never  Substance and Sexual Activity   Alcohol use: Not on file   Drug use: Not on file   Sexual activity: Not on file  Other Topics Concern   Not on file  Social History Narrative   Epworth Sleepiness Scale = 7 (as of 1/18/207)   Social Determinants of Health   Financial Resource Strain: Not on file  Food Insecurity: Not on file  Transportation Needs: Not on file  Physical Activity: Not on file  Stress: Not on file  Social Connections: Not on file  Intimate Partner Violence: Not on file     Current Outpatient Medications:    albuterol (VENTOLIN HFA) 108 (90 Base) MCG/ACT inhaler, Inhale 2 puffs into the lungs every 6 (six) hours as needed for wheezing or shortness of breath., Disp: 8 g, Rfl: 0   amitriptyline (ELAVIL) 25 MG tablet, Take 1 tablet (25 mg total) by mouth at bedtime., Disp: 30 tablet, Rfl: 2   aspirin 325 MG EC tablet, Take 325 mg by mouth daily., Disp: , Rfl:    baclofen (LIORESAL) 10 MG tablet, Take 10-20 tablets by mouth every 6 (six) hours as needed (pain)., Disp: , Rfl:    bumetanide (BUMEX) 2 MG tablet, Take 1 tablet (2 mg total) by mouth 2 (two) times daily., Disp: 180 tablet, Rfl: 3   busPIRone (BUSPAR) 5 MG tablet, Take 5 mg by mouth 3 (three) times daily., Disp: , Rfl:    levETIRAcetam (KEPPRA) 750 MG tablet, Take 750 mg by mouth 2 (two) times daily. Take one tablet by mouth daily in the morning and two tablets by mouth daily in the evening., Disp: , Rfl:    nitroGLYCERIN (NITROSTAT) 0.4 MG SL tablet, Place 0.4 mg under the tongue  every 5 (five) minutes as needed for chest pain., Disp: , Rfl:    omeprazole (PRILOSEC) 40 MG capsule, Take 1 capsule (40 mg total) by mouth daily., Disp: 30 capsule, Rfl: 3   promethazine (PHENERGAN) 25 MG tablet, Take 25 mg by mouth every 6 (six) hours as needed for nausea or vomiting., Disp: , Rfl:    Pyridoxine HCl (B-6 PO), Take 100 mg by mouth daily., Disp: , Rfl:    ranolazine (RANEXA) 500 MG 12 hr tablet, Take 1 tablet (500 mg total) by mouth 2 (two) times daily., Disp: 60 tablet, Rfl: 0   sertraline (ZOLOFT) 100 MG tablet, Take 100 mg by mouth daily., Disp: , Rfl:    vitamin B-12 (CYANOCOBALAMIN) 1000 MCG tablet, Take 1,000 mcg by mouth daily.,  Disp: , Rfl:    potassium chloride SA (KLOR-CON) 20 MEQ tablet, Take 1 tablet (20 mEq total) by mouth 2 (two) times daily., Disp: 180 tablet, Rfl: 3   rosuvastatin (CRESTOR) 10 MG tablet, Take 1 tablet (10 mg total) by mouth daily., Disp: 90 tablet, Rfl: 3   Allergies  Allergen Reactions   Cephalexin Nausea And Vomiting and Rash   Ubrogepant Swelling   CONSTITUTIONAL: Negative for chills, fatigue, fever, unintentional weight gain and unintentional weight loss.  E/N/T: Negative for ear pain, nasal congestion and sore throat.  CARDIOVASCULAR: Negative for chest pain, dizziness, palpitations and pedal edema.  RESPIRATORY: Negative for recent cough and dyspnea.  GASTROINTESTINAL: Negative for abdominal pain, acid reflux symptoms, constipation, diarrhea, nausea and vomiting.  MSK: Negative for arthralgias and myalgias.  INTEGUMENTARY: Negative for rash.  NEUROLOGICAL: Negative for dizziness and headaches.  PSYCHIATRIC: Negative for sleep disturbance and to question depression screen.  Negative for depression, negative for anhedonia.       Objective:  PHYSICAL EXAM:   VS: BP 118/72 (BP Location: Right Arm, Patient Position: Sitting)    Pulse 77    Temp (!) 97.1 F (36.2 C) (Temporal)    Ht $R'5\' 4"'Wq$  (1.626 m)    Wt 286 lb (129.7 kg)    SpO2 94%     BMI 49.09 kg/m   GEN: Well nourished, well developed, in no acute distress  Cardiac: RRR; no murmurs, rubs, or gallops,no edema -  Respiratory:  normal respiratory rate and pattern with no distress - normal breath sounds with no rales, rhonchi, wheezes or rubs GI: normal bowel sounds, no masses or tenderness Skin: warm and dry, no rash  Neuro:  Alert and Oriented x 3,  Psych: euthymic mood, appropriate affect and demeanor     There are no preventive care reminders to display for this patient.  Lab Results  Component Value Date   TSH 2.040 09/24/2020   Lab Results  Component Value Date   WBC 8.3 12/27/2020   HGB 15.7 12/27/2020   HCT 47.0 12/27/2020   MCV 94 12/27/2020   PLT 168 12/27/2020   Lab Results  Component Value Date   NA 143 12/27/2020   K 4.3 12/27/2020   CO2 26 12/27/2020   GLUCOSE 109 (H) 12/27/2020   BUN 14 12/27/2020   CREATININE 1.04 12/27/2020   BILITOT 0.4 12/27/2020   ALKPHOS 116 12/27/2020   AST 29 12/27/2020   ALT 44 12/27/2020   PROT 6.3 12/27/2020   ALBUMIN 4.2 12/27/2020   CALCIUM 8.8 12/27/2020   ANIONGAP 11 04/07/2019   EGFR 82 12/27/2020   Lab Results  Component Value Date   CHOL 123 12/27/2020   Lab Results  Component Value Date   HDL 36 (L) 12/27/2020   Lab Results  Component Value Date   LDLCALC 65 12/27/2020   Lab Results  Component Value Date   TRIG 122 12/27/2020   Lab Results  Component Value Date   CHOLHDL 3.4 12/27/2020   Lab Results  Component Value Date   HGBA1C 6.3 (H) 09/24/2020      Assessment & Plan:   Problem List Items Addressed This Visit                                 Other   Mixed hyperlipidemia   Relevant Medications   isosorbide mononitrate (IMDUR) 15 mg TB24 24 hr tablet   Other Relevant Orders  Lipid panel Continue meds Watch diet    Hx TIA Continue meds and follow up with neurology as directed  GERD Rx for omeprazole as directed   CAD Continue meds as  directed Labwork pending                          No orders of the defined types were placed in this encounter.    Follow-up: Return in about 4 months (around 08/16/2021) for chronic fasting follow up.    SARA R Evynn Boutelle, PA-C

## 2021-04-19 ENCOUNTER — Other Ambulatory Visit: Payer: Self-pay | Admitting: Physician Assistant

## 2021-04-19 LAB — CBC WITH DIFFERENTIAL/PLATELET
Basophils Absolute: 0 10*3/uL (ref 0.0–0.2)
Basos: 1 %
EOS (ABSOLUTE): 0.3 10*3/uL (ref 0.0–0.4)
Eos: 4 %
Hematocrit: 45.4 % (ref 37.5–51.0)
Hemoglobin: 15.4 g/dL (ref 13.0–17.7)
Immature Grans (Abs): 0 10*3/uL (ref 0.0–0.1)
Immature Granulocytes: 0 %
Lymphocytes Absolute: 2.7 10*3/uL (ref 0.7–3.1)
Lymphs: 34 %
MCH: 31.7 pg (ref 26.6–33.0)
MCHC: 33.9 g/dL (ref 31.5–35.7)
MCV: 93 fL (ref 79–97)
Monocytes Absolute: 0.4 10*3/uL (ref 0.1–0.9)
Monocytes: 5 %
Neutrophils Absolute: 4.4 10*3/uL (ref 1.4–7.0)
Neutrophils: 56 %
Platelets: 157 10*3/uL (ref 150–450)
RBC: 4.86 x10E6/uL (ref 4.14–5.80)
RDW: 13.6 % (ref 11.6–15.4)
WBC: 7.8 10*3/uL (ref 3.4–10.8)

## 2021-04-19 LAB — COMPREHENSIVE METABOLIC PANEL
ALT: 32 IU/L (ref 0–44)
AST: 28 IU/L (ref 0–40)
Albumin/Globulin Ratio: 1.8 (ref 1.2–2.2)
Albumin: 4.3 g/dL (ref 3.8–4.9)
Alkaline Phosphatase: 108 IU/L (ref 44–121)
BUN/Creatinine Ratio: 11 (ref 10–24)
BUN: 13 mg/dL (ref 8–27)
Bilirubin Total: 0.8 mg/dL (ref 0.0–1.2)
CO2: 26 mmol/L (ref 20–29)
Calcium: 9.2 mg/dL (ref 8.6–10.2)
Chloride: 101 mmol/L (ref 96–106)
Creatinine, Ser: 1.15 mg/dL (ref 0.76–1.27)
Globulin, Total: 2.4 g/dL (ref 1.5–4.5)
Glucose: 108 mg/dL — ABNORMAL HIGH (ref 70–99)
Potassium: 4.4 mmol/L (ref 3.5–5.2)
Sodium: 140 mmol/L (ref 134–144)
Total Protein: 6.7 g/dL (ref 6.0–8.5)
eGFR: 73 mL/min/{1.73_m2} (ref >59)

## 2021-04-19 LAB — LIPID PANEL
Chol/HDL Ratio: 4 ratio (ref 0.0–5.0)
Cholesterol, Total: 139 mg/dL (ref 100–199)
HDL: 35 mg/dL — ABNORMAL LOW (ref >39)
LDL Chol Calc (NIH): 85 mg/dL (ref 0–99)
Triglycerides: 102 mg/dL (ref 0–149)
VLDL Cholesterol Cal: 19 mg/dL (ref 5–40)

## 2021-04-19 LAB — B12 AND FOLATE PANEL
Folate: 4.3 ng/mL (ref >3.0)
Vitamin B-12: 1130 pg/mL (ref 232–1245)

## 2021-04-19 LAB — CARDIOVASCULAR RISK ASSESSMENT

## 2021-04-19 LAB — VITAMIN D 25 HYDROXY (VIT D DEFICIENCY, FRACTURES): Vit D, 25-Hydroxy: 24.3 ng/mL — ABNORMAL LOW (ref 30.0–100.0)

## 2021-04-19 LAB — HEMOGLOBIN A1C
Est. average glucose Bld gHb Est-mCnc: 123 mg/dL
Hgb A1c MFr Bld: 5.9 % — ABNORMAL HIGH (ref 4.8–5.6)

## 2021-04-19 LAB — TSH: TSH: 1.42 u[IU]/mL (ref 0.450–4.500)

## 2021-04-19 MED ORDER — VITAMIN D (ERGOCALCIFEROL) 1.25 MG (50000 UNIT) PO CAPS: 50000.0000 [IU] | ORAL_CAPSULE | ORAL | 1 refills | Status: DC

## 2021-04-25 ENCOUNTER — Other Ambulatory Visit: Payer: Self-pay | Admitting: Physician Assistant

## 2021-04-25 ENCOUNTER — Other Ambulatory Visit: Payer: Self-pay | Admitting: Cardiology

## 2021-04-25 DIAGNOSIS — M791 Myalgia, unspecified site: Secondary | ICD-10-CM

## 2021-04-27 DIAGNOSIS — I5030 Unspecified diastolic (congestive) heart failure: Secondary | ICD-10-CM | POA: Diagnosis not present

## 2021-04-27 DIAGNOSIS — M13171 Monoarthritis, not elsewhere classified, right ankle and foot: Secondary | ICD-10-CM | POA: Diagnosis not present

## 2021-04-27 DIAGNOSIS — K219 Gastro-esophageal reflux disease without esophagitis: Secondary | ICD-10-CM | POA: Diagnosis not present

## 2021-04-27 DIAGNOSIS — R109 Unspecified abdominal pain: Secondary | ICD-10-CM | POA: Diagnosis not present

## 2021-04-27 DIAGNOSIS — I2510 Atherosclerotic heart disease of native coronary artery without angina pectoris: Secondary | ICD-10-CM | POA: Diagnosis not present

## 2021-04-27 DIAGNOSIS — M7989 Other specified soft tissue disorders: Secondary | ICD-10-CM | POA: Diagnosis not present

## 2021-04-27 DIAGNOSIS — R69 Illness, unspecified: Secondary | ICD-10-CM | POA: Diagnosis not present

## 2021-04-27 DIAGNOSIS — Z20822 Contact with and (suspected) exposure to covid-19: Secondary | ICD-10-CM | POA: Diagnosis not present

## 2021-04-27 DIAGNOSIS — K760 Fatty (change of) liver, not elsewhere classified: Secondary | ICD-10-CM | POA: Diagnosis not present

## 2021-04-27 DIAGNOSIS — R519 Headache, unspecified: Secondary | ICD-10-CM | POA: Diagnosis not present

## 2021-04-27 DIAGNOSIS — R1031 Right lower quadrant pain: Secondary | ICD-10-CM | POA: Diagnosis not present

## 2021-04-27 DIAGNOSIS — I509 Heart failure, unspecified: Secondary | ICD-10-CM | POA: Diagnosis not present

## 2021-04-27 DIAGNOSIS — M7661 Achilles tendinitis, right leg: Secondary | ICD-10-CM | POA: Diagnosis not present

## 2021-04-27 DIAGNOSIS — M25551 Pain in right hip: Secondary | ICD-10-CM | POA: Diagnosis not present

## 2021-04-27 DIAGNOSIS — I700 Atherosclerosis of aorta: Secondary | ICD-10-CM | POA: Diagnosis not present

## 2021-04-27 DIAGNOSIS — R0602 Shortness of breath: Secondary | ICD-10-CM | POA: Diagnosis not present

## 2021-04-27 DIAGNOSIS — E785 Hyperlipidemia, unspecified: Secondary | ICD-10-CM | POA: Diagnosis not present

## 2021-04-27 DIAGNOSIS — M25571 Pain in right ankle and joints of right foot: Secondary | ICD-10-CM | POA: Diagnosis not present

## 2021-04-28 DIAGNOSIS — M13171 Monoarthritis, not elsewhere classified, right ankle and foot: Secondary | ICD-10-CM | POA: Diagnosis not present

## 2021-04-28 DIAGNOSIS — M7661 Achilles tendinitis, right leg: Secondary | ICD-10-CM | POA: Insufficient documentation

## 2021-05-02 ENCOUNTER — Telehealth: Payer: Self-pay | Admitting: Cardiology

## 2021-05-02 NOTE — Telephone Encounter (Signed)
05/02/2021  [facility] received forms from [employer].  Forms put in Dr. Servando Salina box.

## 2021-05-04 ENCOUNTER — Telehealth: Payer: Self-pay | Admitting: Cardiology

## 2021-05-04 DIAGNOSIS — G4733 Obstructive sleep apnea (adult) (pediatric): Secondary | ICD-10-CM | POA: Diagnosis not present

## 2021-05-04 DIAGNOSIS — R569 Unspecified convulsions: Secondary | ICD-10-CM | POA: Diagnosis not present

## 2021-05-04 DIAGNOSIS — G8929 Other chronic pain: Secondary | ICD-10-CM | POA: Diagnosis not present

## 2021-05-04 DIAGNOSIS — R519 Headache, unspecified: Secondary | ICD-10-CM | POA: Diagnosis not present

## 2021-05-04 DIAGNOSIS — R69 Illness, unspecified: Secondary | ICD-10-CM | POA: Diagnosis not present

## 2021-05-04 NOTE — Telephone Encounter (Signed)
Called wife she states that she is not sure where the forms are from but she thinks that it is disability. Please forward to Neurology Fax # [phone] Attn: Lacie Scotts. She states that if this is not filled out it will delay his check, please send ASAP.

## 2021-05-04 NOTE — Telephone Encounter (Signed)
Patient's wife called she wants to know if you could fax the paperwork you couldn't fill out for him over to his neurologist. Fax # [phone] Attn: Eston Esters. Please let patient's wife know when you fax them over.

## 2021-05-05 NOTE — Telephone Encounter (Signed)
Dr. Harriet Rojas spoke with pt on 2/6 and told him she would not be able to fill out the disability forms because from a cardiovascular standpoint he can return to work.   Called pt's wife today but the pt picked up the phone. Spoke with him to let him know we placed the form in the shred bin the other day when it was determined Dr. Harriet Rojas was not the appropriate provider to fill out the disability form. He states the forms were sent to his PCP and this office but not the neurologist. "The neurologist is the one who is keeping me out, they need to fill out the papers." Pt was advised to reach out to the disability office to have them fax a copy to the neurologist office or go by and pick one up.  He verbalized understanding and states he will reach out to him.

## 2021-05-05 NOTE — Telephone Encounter (Signed)
05/02/2021  These forms will need to be completed by PCP. Per Dr.Tobb

## 2021-05-09 ENCOUNTER — Encounter: Payer: Self-pay | Admitting: Physician Assistant

## 2021-05-09 ENCOUNTER — Ambulatory Visit: Payer: 59 | Admitting: Physician Assistant

## 2021-05-09 ENCOUNTER — Other Ambulatory Visit: Payer: Self-pay

## 2021-05-09 VITALS — BP 122/72 | HR 90 | Temp 98.7°F | Ht 64.0 in | Wt 282.0 lb

## 2021-05-09 DIAGNOSIS — M7661 Achilles tendinitis, right leg: Secondary | ICD-10-CM | POA: Diagnosis not present

## 2021-05-09 MED ORDER — PREDNISONE 20 MG PO TABS: ORAL_TABLET | ORAL | 0 refills | Status: AC

## 2021-05-09 NOTE — Progress Notes (Signed)
Subjective:  Patient ID: Randall Rojas, male    DOB: [birth year]  Age: 61 y.o. MRN: [id number]  Chief Complaint  Patient presents with   Hospitalization Follow-up    Achilles Tendinopathy of right ankle    HPI  Pt in today for follow up of achilles tendonitis - he was admitted to hospital last week after having significantly swollen ankle and heel pain - had thorough workup and discharged on decreased dose of aspirin and meloxicam 7.5mg  qd -  He was supposed to have gotten PT but that was never ordered - he does say that he has improved greatly and able to move foot/ankle much better and would like to defer PT at this time Current Outpatient Medications on File Prior to Visit  Medication Sig Dispense Refill   acetaminophen (TYLENOL) 500 MG tablet Take by mouth.     AIMOVIG 140 MG/ML SOAJ SMARTSIG:140 Milligram(s) SUB-Q Once a Month     albuterol (VENTOLIN HFA) 108 (90 Base) MCG/ACT inhaler INHALE 2 PUFFS INTO THE LUNGS EVERY 6 HOURS AS NEEDED FOR WHEEZING OR SHORTNESS OF BREATH. 8.5 g 1   amitriptyline (ELAVIL) 25 MG tablet TAKE 1 TABLET BY MOUTH DAILY AT BEDTIME. 30 tablet 2   aspirin EC 81 MG tablet Take 81 mg by mouth daily. Swallow whole.     baclofen (LIORESAL) 10 MG tablet Take 10-20 tablets by mouth every 6 (six) hours as needed (pain).     bumetanide (BUMEX) 2 MG tablet Take 1 tablet (2 mg total) by mouth 2 (two) times daily. 180 tablet 3   busPIRone (BUSPAR) 5 MG tablet Take 5 mg by mouth 3 (three) times daily.     levETIRAcetam (KEPPRA) 750 MG tablet Take 750 mg by mouth 2 (two) times daily. Take one tablet by mouth daily in the morning and two tablets by mouth daily in the evening.     meloxicam (MOBIC) 7.5 MG tablet Take 7.5 mg by mouth daily.     nitroGLYCERIN (NITROSTAT) 0.4 MG SL tablet Place 0.4 mg under the tongue every 5 (five) minutes as needed for chest pain.     omeprazole (PRILOSEC) 40 MG capsule Take 1 capsule (40 mg total) by mouth daily. 30 capsule 3   promethazine  (PHENERGAN) 25 MG tablet Take 25 mg by mouth every 6 (six) hours as needed for nausea or vomiting.     Pyridoxine HCl (B-6 PO) Take 100 mg by mouth daily.     ranolazine (RANEXA) 500 MG 12 hr tablet TAKE 1 TABLET BY MOUTH TWICE DAILY. 60 tablet 0   sertraline (ZOLOFT) 100 MG tablet Take 100 mg by mouth daily.     vitamin B-12 (CYANOCOBALAMIN) 1000 MCG tablet Take 1,000 mcg by mouth daily.     Vitamin D, Ergocalciferol, (DRISDOL) 1.25 MG (50000 UNIT) CAPS capsule Take 1 capsule (50,000 Units total) by mouth every 7 (seven) days. 12 capsule 1   potassium chloride SA (KLOR-CON) 20 MEQ tablet Take 1 tablet (20 mEq total) by mouth 2 (two) times daily. 180 tablet 3   rosuvastatin (CRESTOR) 10 MG tablet Take 1 tablet (10 mg total) by mouth daily. 90 tablet 3   No current facility-administered medications on file prior to visit.   Past Medical History:  Diagnosis Date   Acute laryngopharyngitis 11/11/2020   Acute URI 12/12/2019   Anxiety 06/17/2020   Arthralgia 06/17/2020   Asthma    Bilateral leg edema 06/23/2020   Chest pain 04/15/2015   Chronic diastolic heart failure (  HCC) 08/05/2020   Chronic heart failure with preserved ejection fraction (HCC) 07/11/2020   Coronary artery calcification seen on CT scan 04/15/2015   Coronary artery disease    Coronary artery disease involving native coronary artery of native heart without angina pectoris 07/11/2020   Coronary artery disease of native artery of native heart with stable angina pectoris (HCC) 06/23/2020   Essential hypertension 04/15/2015   Exertional dyspnea 05/26/2020   Frequent headaches 07/20/2020   GERD (gastroesophageal reflux disease) 05/27/2019   History of CVA in adulthood 08/05/2020   HLD (hyperlipidemia) 07/11/2020   Hyperglycemia 06/06/2015   Hyperlipidemia 04/15/2015   Hypertension 08/05/2020   Hypokalemia 07/20/2020   Left sided numbness 07/25/2020   Leukocytosis 06/06/2015   Malaise 06/17/2020   Mixed hyperlipidemia 04/15/2015   Morbid obesity  (HCC) 04/15/2015   Need for prophylactic vaccination against Streptococcus pneumoniae (pneumococcus) 12/03/2019   Need for prophylactic vaccination and inoculation against influenza 12/03/2019   Need for tetanus booster 06/17/2020   Nonepileptic episode (HCC) 10/20/2020   Obesity, Class III, BMI 40-49.9 (morbid obesity) (HCC) 04/15/2015   Other sleep apnea 07/20/2020   Palpitations 08/05/2020   Pneumonia due to COVID-19 virus 04/06/2019   Prediabetes 06/23/2020   Prostate cancer screening 06/02/2019   Rectal bleeding 11/01/2020   Seizure-like activity (HCC) 05/26/2020   Shortness of breath 06/23/2020   TIA (transient ischemic attack)    Upper extremity weakness 05/26/2020   Weakness of left lower extremity 05/26/2020   Past Surgical History:  Procedure Laterality Date   CARDIAC CATHETERIZATION N/A 04/22/2015   Procedure: Left Heart Cath and Coronary Angiography;  Surgeon: Peter M Swaziland, MD;  Location: [facility] INVASIVE CV LAB;  Service: Cardiovascular;  Laterality: N/A;   GALLBLADDER SURGERY     LEFT HEART CATH AND CORONARY ANGIOGRAPHY N/A 10/28/2020   Procedure: LEFT HEART CATH AND CORONARY ANGIOGRAPHY;  Surgeon: Lennette Bihari, MD;  Location: [facility] INVASIVE CV LAB;  Service: Cardiovascular;  Laterality: N/A;   SINUS SURGERY WITH INSTATRAK      Family History  Problem Relation Age of Onset   Hypertension Mother    Rheum arthritis Mother    Cancer Father    Heart attack Father    Cancer Brother    Hypertension Sister    Social History   Socioeconomic History   Marital status: Married    Spouse name: Not on file   Number of children: Not on file   Years of education: Not on file   Highest education level: Not on file  Occupational History   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  Tobacco Use   Smoking status: Former    Types: Cigarettes    Quit date: 04/13/1989    Years since quitting: 32.0   Smokeless tobacco: Never  Substance and Sexual Activity   Alcohol use: Not on file   Drug use: Not on file   Sexual  activity: Not on file  Other Topics Concern   Not on file  Social History Narrative   Epworth Sleepiness Scale = 7 (as of 1/18/207)   Social Determinants of Health   Financial Resource Strain: Not on file  Food Insecurity: Not on file  Transportation Needs: Not on file  Physical Activity: Not on file  Stress: Not on file  Social Connections: Not on file    Review of Systems CONSTITUTIONAL: Negative for chills, fatigue, fever, unintentional weight gain and unintentional weight loss.  CARDIOVASCULAR: Negative for chest pain, dizziness, palpitations and pedal edema.  RESPIRATORY: Negative for recent  cough and dyspnea.  MSK: see HPI INTEGUMENTARY: Negative for rash.     Objective:  BP 122/72 (BP Location: Left Arm, Patient Position: Sitting)    Pulse 90    Temp 98.7 F (37.1 C) (Oral)    Ht 5\' 4"  (1.626 m)    Wt 282 lb (127.9 kg)    SpO2 95%    BMI 48.41 kg/m   BP/Weight 05/09/2021 04/18/2021 03/31/2021  Systolic BP 122 118 118  Diastolic BP 72 72 72  Wt. (Lbs) 282 286 289.2  BMI 48.41 49.09 49.64    Physical Exam PHYSICAL EXAM:   VS: BP 122/72 (BP Location: Left Arm, Patient Position: Sitting)    Pulse 90    Temp 98.7 F (37.1 C) (Oral)    Ht 5\' 4"  (1.626 m)    Wt 282 lb (127.9 kg)    SpO2 95%    BMI 48.41 kg/m   GEN: Well nourished, well developed, in no acute distress  Cardiac: RRR; no murmurs, Respiratory:  normal respiratory rate and pattern with no distress - normal breath sounds with no rales, rhonchi, wheezes or rubs  MS: no deformity or atrophy however has moderate tenderness right achilles - does have good rom of ankle/foot and is able to bear weight Skin: warm and dry, no rash    Diabetic Foot Exam - Simple   No data filed      Lab Results  Component Value Date   WBC 7.8 04/18/2021   HGB 15.4 04/18/2021   HCT 45.4 04/18/2021   PLT 157 04/18/2021   GLUCOSE 108 (H) 04/18/2021   CHOL 139 04/18/2021   TRIG 102 04/18/2021   HDL 35 (L) 04/18/2021    LDLCALC 85 04/18/2021   ALT 32 04/18/2021   AST 28 04/18/2021   NA 140 04/18/2021   K 4.4 04/18/2021   CL 101 04/18/2021   CREATININE 1.15 04/18/2021   BUN 13 04/18/2021   CO2 26 04/18/2021   TSH 1.420 04/18/2021   INR 0.92 04/14/2015   HGBA1C 5.9 (H) 04/18/2021      Assessment & Plan:   Problem List Items Addressed This Visit   None Visit Diagnoses     Achilles tendinitis of right lower extremity    -  Primary   Relevant Medications   predniSONE (DELTASONE) 20 MG tablet Continue meds as directed     .  Meds ordered this encounter  Medications   predniSONE (DELTASONE) 20 MG tablet    Sig: Take 3 tablets (60 mg total) by mouth daily with breakfast for 3 days, THEN 2 tablets (40 mg total) daily with breakfast for 3 days, THEN 1 tablet (20 mg total) daily with breakfast for 3 days.    Dispense:  18 tablet    Refill:  0    Order Specific Question:   Supervising Provider    Answer:   04/16/2015    No orders of the defined types were placed in this encounter.    Follow-up: Return if symptoms worsen or fail to improve.  An After Visit Summary was printed and given to the patient.  04/20/2021 [facility] [phone]

## 2021-05-11 DIAGNOSIS — Z7409 Other reduced mobility: Secondary | ICD-10-CM | POA: Diagnosis not present

## 2021-05-11 DIAGNOSIS — G4733 Obstructive sleep apnea (adult) (pediatric): Secondary | ICD-10-CM | POA: Diagnosis not present

## 2021-05-11 DIAGNOSIS — R200 Anesthesia of skin: Secondary | ICD-10-CM | POA: Diagnosis not present

## 2021-05-20 ENCOUNTER — Other Ambulatory Visit: Payer: Self-pay | Admitting: Cardiology

## 2021-05-20 ENCOUNTER — Other Ambulatory Visit: Payer: Self-pay | Admitting: Physician Assistant

## 2021-05-20 DIAGNOSIS — K219 Gastro-esophageal reflux disease without esophagitis: Secondary | ICD-10-CM

## 2021-05-25 ENCOUNTER — Telehealth: Payer: Self-pay | Admitting: Cardiology

## 2021-05-25 NOTE — Telephone Encounter (Signed)
Sleep machine that he has is causing him to have dry mouth and chest is hearting in middle of the night. Calling to see what can be done because he unable to sleep. Please advise ?

## 2021-06-08 DIAGNOSIS — Z7409 Other reduced mobility: Secondary | ICD-10-CM | POA: Diagnosis not present

## 2021-06-08 DIAGNOSIS — G4733 Obstructive sleep apnea (adult) (pediatric): Secondary | ICD-10-CM | POA: Diagnosis not present

## 2021-06-08 DIAGNOSIS — R200 Anesthesia of skin: Secondary | ICD-10-CM | POA: Diagnosis not present

## 2021-06-15 ENCOUNTER — Ambulatory Visit: Payer: 59 | Admitting: Cardiology

## 2021-06-15 ENCOUNTER — Encounter: Payer: Self-pay | Admitting: Cardiology

## 2021-06-15 ENCOUNTER — Other Ambulatory Visit: Payer: Self-pay

## 2021-06-15 VITALS — BP 134/87 | HR 76 | Ht 64.0 in | Wt 288.0 lb

## 2021-06-15 DIAGNOSIS — Z8673 Personal history of transient ischemic attack (TIA), and cerebral infarction without residual deficits: Secondary | ICD-10-CM | POA: Diagnosis not present

## 2021-06-15 DIAGNOSIS — R0609 Other forms of dyspnea: Secondary | ICD-10-CM | POA: Diagnosis not present

## 2021-06-15 DIAGNOSIS — I10 Essential (primary) hypertension: Secondary | ICD-10-CM

## 2021-06-15 DIAGNOSIS — I2510 Atherosclerotic heart disease of native coronary artery without angina pectoris: Secondary | ICD-10-CM

## 2021-06-15 DIAGNOSIS — Z79899 Other long term (current) drug therapy: Secondary | ICD-10-CM | POA: Diagnosis not present

## 2021-06-15 DIAGNOSIS — Z599 Problem related to housing and economic circumstances, unspecified: Secondary | ICD-10-CM

## 2021-06-15 DIAGNOSIS — I5032 Chronic diastolic (congestive) heart failure: Secondary | ICD-10-CM

## 2021-06-15 DIAGNOSIS — R7303 Prediabetes: Secondary | ICD-10-CM | POA: Diagnosis not present

## 2021-06-15 DIAGNOSIS — Z139 Encounter for screening, unspecified: Secondary | ICD-10-CM

## 2021-06-15 MED ORDER — NITROGLYCERIN 0.4 MG SL SUBL: 0.4000 mg | SUBLINGUAL_TABLET | SUBLINGUAL | 3 refills | Status: DC | PRN

## 2021-06-15 NOTE — Progress Notes (Signed)
?Cardiology Office Note:   ? ?Date:  06/15/2021  ? ?ID:  Randall Rojas, DOB 11/26/1960, MRN [id number] ? ?PCP:  Marianne Sofia, PA-C  ?Cardiologist:  Thomasene Ripple, DO  ?Electrophysiologist:  None  ? ?Referring MD: Marianne Sofia, PA-C  ? ?" I am ok" ? ?History of Present Illness:   ? ?Randall Rojas is a 61 y.o. male with a hx of  coronary artery disease with recent nuclear stress test showing no evidence of ischemia, recent TIA the patient is on dual antiplatelet therapy, hypertension, hyperlipidemia, chronic diastolic heart failure with his recent EF in May 2022 55-60%. ?  ?I last saw the patient on Aug 05, 2020, and prior to that visit the patient reported that he had been admitted twice at the [facility] hospital first for concern for stroke.  Both visits there was concern for TIA.  The second visit which was on July 11, 2020 the patient has significant headache along with left-sided weakness and numbness.  He was then admitted to the [facility] hospital.  During that time he underwent multiple testing modalities his MRI demonstrated no acute cranial abnormalities, his CTA showed 50% stenosis of the right ICA and less than 50% stenosis of the left ICA.  The patient did see neurology while he was in the hospital he was started on dual antiplatelet therapy for 3 months and then will transition to aspirin only.  He is now following with neurology.  He was started on statins as well. ?  ?At the conclusion of his Aug 05, 2020 visit he reported that he was experiencing some fluttering at therefore place a monitor on the patient.  At that time he also appeared to be fluid overloaded we restarted his Lasix. ?  ?Since I last saw the patient he had been admitted at [facility] hospital with a headache concerns that the patient may be experiencing multiple episodes of seizure.  He is being worked up by neurology there.  He is planning on undergoing a inpatient continuous EEG monitoring with [facility]. ?  ?I saw the patient in August 25, 2020 at that time we started him from Lasix to Bumex and he did not have any angina.  He ended up at [facility] where he was treated for heart failure and did have some chest pain which started Imdur on the patient. ?  ?On July 1 I saw the patient posthospitalization from [facility] where he was started on Imdur for chest pain.  He had had some rash on the Imdur so I stopped that and have the patient take ranolazine. ?  ?In July 2022he was hospitalized he had a inpatient stay at [facility] undergoing testing for for 3-day work-up for suspected seizures versus strokes.  He was told that he has many seizures but no epileptic seizures. ?  ?At his last visit October 27, 2018 with the patient experiencing significant chest pain with shortness of breath.  With this given his negative Lexiscan in April I sent the patient for right and left heart catheterization.  His catheterization essentially did show mild nonobstructive CAD unfortunately the right heart cath was not performed on sure why. ? ?I saw the patient in December 14, 2020 at that time he states your company did not cover his Ranexa therefore I placed the patient on Imdur and he did not tolerate that medication.  During that visit he still is experiencing shortness of breath on exertion,  suspecting that untreated sleep apnea may be playing a role I referred the patient for sleep study as well as a pulmonary evaluation.  He did get a sleep study which revealed significant/severe sleep apnea.  He has since been started on the CPAP. ? ? ?He still is short of breath he tells me.  Also he notes that he recently was diagnosed with CVA and has had some Achilles tendon pain and has been following with with orthopedics. ? ?No other complaints at this time. ? ?Past Medical History:  ?Diagnosis Date  ? Acute laryngopharyngitis 11/11/2020  ? Acute URI 12/12/2019  ? Anxiety 06/17/2020  ? Arthralgia  06/17/2020  ? Asthma   ? Bilateral leg edema 06/23/2020  ? Chest pain 04/15/2015  ? Chronic diastolic heart failure (HCC) 08/05/2020  ? Chronic heart failure with preserved ejection fraction (HCC) 07/11/2020  ? Coronary artery calcification seen on CT scan 04/15/2015  ? Coronary artery disease   ? Coronary artery disease involving native coronary artery of native heart without angina pectoris 07/11/2020  ? Coronary artery disease of native artery of native heart with stable angina pectoris (HCC) 06/23/2020  ? Essential hypertension 04/15/2015  ? Exertional dyspnea 05/26/2020  ? Frequent headaches 07/20/2020  ? GERD (gastroesophageal reflux disease) 05/27/2019  ? History of CVA in adulthood 08/05/2020  ? HLD (hyperlipidemia) 07/11/2020  ? Hyperglycemia 06/06/2015  ? Hyperlipidemia 04/15/2015  ? Hypertension 08/05/2020  ? Hypokalemia 07/20/2020  ? Left sided numbness 07/25/2020  ? Leukocytosis 06/06/2015  ? Malaise 06/17/2020  ? Mixed hyperlipidemia 04/15/2015  ? Morbid obesity (HCC) 04/15/2015  ? Need for prophylactic vaccination against Streptococcus pneumoniae (pneumococcus) 12/03/2019  ? Need for prophylactic vaccination and inoculation against influenza 12/03/2019  ? Need for tetanus booster 06/17/2020  ? Nonepileptic episode (HCC) 10/20/2020  ? Obesity, Class III, BMI 40-49.9 (morbid obesity) (HCC) 04/15/2015  ? Other sleep apnea 07/20/2020  ? Palpitations 08/05/2020  ? Pneumonia due to COVID-19 virus 04/06/2019  ? Prediabetes 06/23/2020  ? Prostate cancer screening 06/02/2019  ? Rectal bleeding 11/01/2020  ? Seizure-like activity (HCC) 05/26/2020  ? Shortness of breath 06/23/2020  ? TIA (transient ischemic attack)   ? Upper extremity weakness 05/26/2020  ? Weakness of left lower extremity 05/26/2020  ? ? ?Past Surgical History:  ?Procedure Laterality Date  ? CARDIAC CATHETERIZATION N/A 04/22/2015  ? Procedure: Left Heart Cath and Coronary Angiography;  Surgeon: Peter M Swaziland, MD;  Location: [facility] INVASIVE CV LAB;  Service: Cardiovascular;  Laterality: N/A;  ?  GALLBLADDER SURGERY    ? LEFT HEART CATH AND CORONARY ANGIOGRAPHY N/A 10/28/2020  ? Procedure: LEFT HEART CATH AND CORONARY ANGIOGRAPHY;  Surgeon: Lennette Bihari, MD;  Location: [facility] INVASIVE CV LAB;  Service: Cardiovascular;  Laterality: N/A;  ? SINUS SURGERY WITH INSTATRAK    ? ? ?Current Medications: ?Current Meds  ?Medication Sig  ? acetaminophen (TYLENOL) 500 MG tablet Take by mouth.  ? AIMOVIG 140 MG/ML SOAJ SMARTSIG:140 Milligram(s) SUB-Q Once a Month  ? albuterol (VENTOLIN HFA) 108 (90 Base) MCG/ACT inhaler INHALE 2 PUFFS INTO THE LUNGS EVERY 6 HOURS AS NEEDED FOR WHEEZING OR SHORTNESS OF BREATH.  ? amitriptyline (ELAVIL) 25 MG tablet TAKE 1 TABLET BY MOUTH DAILY AT BEDTIME.  ? aspirin EC 81 MG tablet Take 81 mg by mouth daily. Swallow whole.  ? baclofen (LIORESAL) 10 MG tablet Take 10-20 tablets by mouth every 6 (six) hours as needed (pain).  ? bumetanide (BUMEX) 2 MG tablet Take 1 tablet (2 mg total)  by mouth 2 (two) times daily.  ? busPIRone (BUSPAR) 5 MG tablet Take 5 mg by mouth 3 (three) times daily.  ? levETIRAcetam (KEPPRA) 750 MG tablet Take 750 mg by mouth 2 (two) times daily. Take one tablet by mouth daily in the morning and two tablets by mouth daily in the evening.  ? meloxicam (MOBIC) 7.5 MG tablet Take 7.5 mg by mouth daily.  ? omeprazole (PRILOSEC) 40 MG capsule TAKE 1 CAPSULE BY MOUTH ONCE DAILY  ? promethazine (PHENERGAN) 25 MG tablet Take 25 mg by mouth every 6 (six) hours as needed for nausea or vomiting.  ? Pyridoxine HCl (B-6 PO) Take 100 mg by mouth daily.  ? ranolazine (RANEXA) 500 MG 12 hr tablet TAKE 1 TABLET BY MOUTH TWICE DAILY.  ? sertraline (ZOLOFT) 100 MG tablet Take 100 mg by mouth daily.  ? vitamin B-12 (CYANOCOBALAMIN) 1000 MCG tablet Take 1,000 mcg by mouth daily.  ? Vitamin D, Ergocalciferol, (DRISDOL) 1.25 MG (50000 UNIT) CAPS capsule Take 1 capsule (50,000 Units total) by mouth every 7 (seven) days.  ? [DISCONTINUED] nitroGLYCERIN (NITROSTAT) 0.4 MG SL tablet Place 0.4 mg  under the tongue every 5 (five) minutes as needed for chest pain.  ?  ? ?Allergies:   Cephalexin and Ubrogepant  ? ?Social History  ? ?Socioeconomic History  ? Marital status: Married  ?  Spouse name: Not on file

## 2021-06-15 NOTE — Patient Instructions (Addendum)
Medication Instructions:  Your physician recommends that you continue on your current medications as directed. Please refer to the Current Medication list given to you today.  *If you need a refill on your cardiac medications before your next appointment, please call your pharmacy*   Lab Work: Your physician recommends that you return for lab work in:  TODAY: BMET, Mag If you have labs (blood work) drawn today and your tests are completely normal, you will receive your results only by: MyChart Message (if you have MyChart) OR A paper copy in the mail If you have any lab test that is abnormal or we need to change your treatment, we will call you to review the results.   Testing/Procedures: None   Follow-Up: At [facility], you and your health needs are our priority.  As part of our continuing mission to provide you with exceptional heart care, we have created designated Provider Care Teams.  These Care Teams include your primary Cardiologist (physician) and Advanced Practice Providers (APPs -  Physician Assistants and Nurse Practitioners) who all work together to provide you with the care you need, when you need it.  We recommend signing up for the patient portal called "MyChart".  Sign up information is provided on this After Visit Summary.  MyChart is used to connect with patients for Virtual Visits (Telemedicine).  Patients are able to view lab/test results, encounter notes, upcoming appointments, etc.  Non-urgent messages can be sent to your provider as well.   To learn more about what you can do with MyChart, go to [email or web address].    Your next appointment:   6 month(s)  The format for your next appointment:   In Person  Provider:   Kardie Tobb, DO     Other Instructions   

## 2021-06-16 ENCOUNTER — Telehealth: Payer: Self-pay | Admitting: Licensed Clinical Social Worker

## 2021-06-16 LAB — BASIC METABOLIC PANEL
BUN/Creatinine Ratio: 12 (ref 10–24)
BUN: 12 mg/dL (ref 8–27)
CO2: 23 mmol/L (ref 20–29)
Calcium: 8.7 mg/dL (ref 8.6–10.2)
Chloride: 102 mmol/L (ref 96–106)
Creatinine, Ser: 1.01 mg/dL (ref 0.76–1.27)
Glucose: 97 mg/dL (ref 70–99)
Potassium: 4.6 mmol/L (ref 3.5–5.2)
Sodium: 144 mmol/L (ref 134–144)
eGFR: 85 mL/min/{1.73_m2} (ref >59)

## 2021-06-16 LAB — MAGNESIUM: Magnesium: 2.7 mg/dL — ABNORMAL HIGH (ref 1.6–2.3)

## 2021-06-17 NOTE — Progress Notes (Signed)
?Heart and Vascular Care Navigation ? ?06/17/2021 ? ?Randall Rojas ?11-12-1960 ?[id number] ? ?Reason for Referral:  ?Financial hardship/past due medical bills ?Engaged with patient by telephone for initial visit for Heart and Vascular Care Coordination. ?                                                                                                  ?Assessment:                                     ?LCSW was able to reach pt at [phone]. Introduced self, role, reason for call. Confirmed home address [address] lives w/ his wife Darel Hong and her mother. Her mother likely will move out soon. Pt wife manages the bills so he is unsure if they are past due/have any turn off notices. He has been out of work for about a year and his wife does not work either. He last attempted to apply for Medicaid "years ago." I encouraged him to speak with DSS about eligibility as well as financial programs that may be available to help with general costs of living. Pt does have a disability application pending "with a law firm in [place]."  ? ?He notes his inhalers have been expensive, has not filled some. Since he has Nurse, learning disability he is likely not eligible for PAP so I encouraged him to see if office prescribing those has any samples or assistance they can provide. I shared that pt may be eligible for [facility] Financial Assistance Hardship program. I briefly went over the application and what it would need for pt to gather. Pt agreeable to this being sent to him. I encouraged him to reach out with any questions or concerns.  ? ?HRT/VAS Care Coordination   ? ? Patients Home Cardiology Office [facility]  ? Outpatient Care Team Social Worker  ? Social Worker Name: Esmeralda [facility] [phone]  ? Living arrangements for the past 2 months Single Family Home  ? Lives with: Spouse; Parents  ? Patient Current Optometrist  ? Patient Has Concern With Paying Medical Bills Yes  ?  Patient Concerns With Medical Bills past due medical bills > $5000  ? Medical Bill Referrals: [employer] Assistance (Hardship program)  ? Does Patient Have Prescription Coverage? Yes  ? Home Assistive Devices/Equipment None  ? ?  ? ? ?Social History:                                                                             ?SDOH Screenings  ? ?Alcohol Screen: Not on file  ?Depression (PHQ2-9): Not on file  ?Financial Resource Strain: High Risk  ? Difficulty of Paying Living Expenses: Hard  ?Food  Insecurity: Not on file  ?Housing: Low Risk   ? Last Housing Risk Score: 0  ?Physical Activity: Not on file  ?Social Connections: Not on file  ?Stress: Not on file  ?Tobacco Use: Medium Risk  ? Smoking Tobacco Use: Former  ? Smokeless Tobacco Use: Never  ? Passive Exposure: Not on file  ?Transportation Needs: No Transportation Needs  ? Lack of Transportation (Medical): No  ? Lack of Transportation (Non-Medical): No  ? ? ?SDOH Interventions: ?Financial Resources:  Financial Strain Interventions: Other (Comment) (Sent [facility] Hardship Assistance; discussed [place] pharmacies; information about DSS assistance including SNAP) ?DSS for financial assistance and Financial Counseling for [employer] Program  ?Food Insecurity:     ?Housing Insecurity:  Housing Interventions: Intervention Not Indicated  ?Transportation:   Transportation Interventions: Intervention Not Indicated  ? ? ? ?Follow-up plan:   ?LCSW mailed pt information on how to apply for Medicaid, DSS information, and [facility]. I have included my contact card for pt/pt wife as needed.  ? ? ? ?

## 2021-06-23 ENCOUNTER — Telehealth: Payer: Self-pay | Admitting: Licensed Clinical Social Worker

## 2021-06-23 DIAGNOSIS — R69 Illness, unspecified: Secondary | ICD-10-CM | POA: Diagnosis not present

## 2021-06-23 DIAGNOSIS — F332 Major depressive disorder, recurrent severe without psychotic features: Secondary | ICD-10-CM | POA: Insufficient documentation

## 2021-06-23 DIAGNOSIS — F411 Generalized anxiety disorder: Secondary | ICD-10-CM | POA: Diagnosis not present

## 2021-06-23 DIAGNOSIS — F419 Anxiety disorder, unspecified: Secondary | ICD-10-CM | POA: Diagnosis not present

## 2021-06-23 NOTE — Telephone Encounter (Signed)
LCSW called pt to f/u on hardship application mailed last week.  ?No answer at [phone], message left for pt requesting call back. I will f/u again as able.  ? ?Octavio Graves, MSW, LCSW ?Clinical Social Worker II ?[facility] Heart/Vascular Care Navigation  ?[phone]- work cell phone (preferred) ?[phone]- desk phone ? ?

## 2021-06-24 ENCOUNTER — Telehealth: Payer: Self-pay | Admitting: Licensed Clinical Social Worker

## 2021-06-24 NOTE — Telephone Encounter (Signed)
LCSW reached out to pt this afternoon regarding assistance applications, was able to reach him today at [phone]. Pt confirmed he has received application and he and his wife will review them. My card is also included and pt was encouraged to call or text with any questions or concerns. I will f/u w/ pt as able to also check in if I do not hear from them moving forward.  ? ?Randall Rojas, MSW, LCSW ?Clinical Social Worker II ?[place] Heart/Vascular Care Navigation  ?[phone]- work cell phone (preferred) ?[phone]- desk phone ? ?

## 2021-06-28 ENCOUNTER — Telehealth: Payer: Self-pay | Admitting: *Deleted

## 2021-06-28 ENCOUNTER — Telehealth (INDEPENDENT_AMBULATORY_CARE_PROVIDER_SITE_OTHER): Payer: 59 | Admitting: Cardiology

## 2021-06-28 ENCOUNTER — Encounter: Payer: Self-pay | Admitting: Cardiology

## 2021-06-28 VITALS — BP 127/80 | Ht 64.0 in | Wt 280.0 lb

## 2021-06-28 DIAGNOSIS — G4733 Obstructive sleep apnea (adult) (pediatric): Secondary | ICD-10-CM

## 2021-06-28 NOTE — Patient Instructions (Addendum)
Medication Instructions:  ?Your physician recommends that you continue on your current medications as directed. Please refer to the Current Medication list given to you today. ? ?*If you need a refill on your cardiac medications before your next appointment, please call your pharmacy* ? ?Follow-Up: ?At [facility], you and your health needs are our priority.  As part of our continuing mission to provide you with exceptional heart care, we have created designated Provider Care Teams.  These Care Teams include your primary Cardiologist (physician) and Advanced Practice Providers (APPs -  Physician Assistants and Nurse Practitioners) who all work together to provide you with the care you need, when you need it. ? ? ?Your next appointment:   ?10/04/21 at 9:40am  ? ?The format for your next appointment:   ?Virtual Visit  ? ?Provider:   ?Armanda Magic, MD ? ? ?

## 2021-06-28 NOTE — Progress Notes (Signed)
? ?Virtual Visit via Video Note  ? ?This visit type was conducted due to national recommendations for restrictions regarding the COVID-19 Pandemic (e.g. social distancing) in an effort to limit this patient's exposure and mitigate transmission in our community.  Due to his co-morbid illnesses, this patient is at least at moderate risk for complications without adequate follow up.  This format is felt to be most appropriate for this patient at this time.  All issues noted in this document were discussed and addressed.  A limited physical exam was performed with this format.  Please refer to the patient's chart for his consent to telehealth for [facility]. ? ?  ? ?Date:  06/28/2021  ? ?ID:  Randall Rojas, DOB 1961-03-16, MRN [id number] ?The patient was identified using 2 identifiers. ? ?Patient Location: Home ?Provider Location: Office/Clinic ? ? ?PCP:  Marge Duncans, PA-C ?  ?[facility] Providers ?Cardiologist:  Berniece Salines, DO    ? ?Evaluation Performed:  Follow-Up Visit ? ?Chief Complaint:  OSA ? ?History of Present Illness:   ? ?Randall Rojas is a 61 y.o. male with a hx of anxiety, CAD, HTN, HLD who was referred for sleep study due to his complaints of SOB and morbid obesity.  He tells me that he was having problems with excessive daytime sleepiness and also was told that he snored and would wake up gasping for breath . He underwent split-night sleep study on 03/25/2021 showing Severe obstructive sleep apnea with an AHI of 29.7/h and oxygen saturations as low as 81%.  He was titrated to CPAP at 13 cm H2O.  He was set up on CPAP and is now here for follow-up. ? ?He says that ever since he started on CPAP he is not sleeping well because he has to wake up every 3 hours to put water in it.  He says that he has bad mouth dryness and his tongue sticks to the roof of his mouth.  He tolerates the full face mask and feels the pressure is adequate but says that he panics when it first goes on for fear he will not get  enough air.  He does not think that he snores according to his wife. ? ?The patient does not have symptoms concerning for COVID-19 infection (fever, chills, cough, or new shortness of breath).  ? ? ?Past Medical History:  ?Diagnosis Date  ? Acute laryngopharyngitis 11/11/2020  ? Acute URI 12/12/2019  ? Anxiety 06/17/2020  ? Arthralgia 06/17/2020  ? Asthma   ? Bilateral leg edema 06/23/2020  ? Chest pain 04/15/2015  ? Chronic diastolic heart failure ([place]) 08/05/2020  ? Chronic heart failure with preserved ejection fraction ([place]) 07/11/2020  ? Coronary artery calcification seen on CT scan 04/15/2015  ? Coronary artery disease   ? Coronary artery disease involving native coronary artery of native heart without angina pectoris 07/11/2020  ? Coronary artery disease of native artery of native heart with stable angina pectoris ([place]) 06/23/2020  ? Essential hypertension 04/15/2015  ? Exertional dyspnea 05/26/2020  ? Frequent headaches 07/20/2020  ? GERD (gastroesophageal reflux disease) 05/27/2019  ? History of CVA in adulthood 08/05/2020  ? HLD (hyperlipidemia) 07/11/2020  ? Hyperglycemia 06/06/2015  ? Hyperlipidemia 04/15/2015  ? Hypertension 08/05/2020  ? Hypokalemia 07/20/2020  ? Left sided numbness 07/25/2020  ? Leukocytosis 06/06/2015  ? Malaise 06/17/2020  ? Mixed hyperlipidemia 04/15/2015  ? Morbid obesity ([place]) 04/15/2015  ? Need for prophylactic vaccination against Streptococcus pneumoniae (pneumococcus) 12/03/2019  ? Need for  prophylactic vaccination and inoculation against influenza 12/03/2019  ? Need for tetanus booster 06/17/2020  ? Nonepileptic episode ([place]) 10/20/2020  ? Obesity, Class III, BMI 40-49.9 (morbid obesity) ([place]) 04/15/2015  ? Other sleep apnea 07/20/2020  ? Palpitations 08/05/2020  ? Pneumonia due to COVID-19 virus 04/06/2019  ? Prediabetes 06/23/2020  ? Prostate cancer screening 06/02/2019  ? Rectal bleeding 11/01/2020  ? Seizure-like activity ([place]) 05/26/2020  ? Shortness of breath 06/23/2020  ? TIA (transient ischemic attack)   ? Upper  extremity weakness 05/26/2020  ? Weakness of left lower extremity 05/26/2020  ? ?Past Surgical History:  ?Procedure Laterality Date  ? CARDIAC CATHETERIZATION N/A 04/22/2015  ? Procedure: Left Heart Cath and Coronary Angiography;  Surgeon: Peter M Martinique, MD;  Location: [facility];  Service: Cardiovascular;  Laterality: N/A;  ? GALLBLADDER SURGERY    ? LEFT HEART CATH AND CORONARY ANGIOGRAPHY N/A 10/28/2020  ? Procedure: LEFT HEART CATH AND CORONARY ANGIOGRAPHY;  Surgeon: Troy Sine, MD;  Location: [facility];  Service: Cardiovascular;  Laterality: N/A;  ? SINUS SURGERY WITH INSTATRAK    ?  ? ?Current Meds  ?Medication Sig  ? acetaminophen (TYLENOL) 500 MG tablet Take by mouth.  ? AIMOVIG 140 MG/ML SOAJ SMARTSIG:140 Milligram(s) SUB-Q Once a Month  ? albuterol (VENTOLIN HFA) 108 (90 Base) MCG/ACT inhaler INHALE 2 PUFFS INTO THE LUNGS EVERY 6 HOURS AS NEEDED FOR WHEEZING OR SHORTNESS OF BREATH.  ? amitriptyline (ELAVIL) 25 MG tablet TAKE 1 TABLET BY MOUTH DAILY AT BEDTIME.  ? aspirin EC 81 MG tablet Take 81 mg by mouth daily. Swallow whole.  ? baclofen (LIORESAL) 10 MG tablet Take 10-20 tablets by mouth every 6 (six) hours as needed (pain).  ? bumetanide (BUMEX) 2 MG tablet Take 1 tablet (2 mg total) by mouth 2 (two) times daily.  ? busPIRone (BUSPAR) 5 MG tablet Take 10 mg by mouth 2 (two) times daily.  ? ketoconazole (NIZORAL) 2 % shampoo Apply topically.  ? levETIRAcetam (KEPPRA) 750 MG tablet Take 750 mg by mouth 2 (two) times daily. Take one tablet by mouth daily in the morning and two tablets by mouth daily in the evening.  ? meloxicam (MOBIC) 7.5 MG tablet Take 7.5 mg by mouth daily.  ? nitroGLYCERIN (NITROSTAT) 0.4 MG SL tablet Place 1 tablet (0.4 mg total) under the tongue every 5 (five) minutes as needed for chest pain. Up to three times.  ? omeprazole (PRILOSEC) 40 MG capsule TAKE 1 CAPSULE BY MOUTH ONCE DAILY  ? potassium chloride SA (KLOR-CON) 20 MEQ tablet Take 1 tablet (20 mEq total) by  mouth 2 (two) times daily.  ? promethazine (PHENERGAN) 25 MG tablet Take 25 mg by mouth every 6 (six) hours as needed for nausea or vomiting.  ? Pyridoxine HCl (B-6 PO) Take 100 mg by mouth daily.  ? ranolazine (RANEXA) 500 MG 12 hr tablet TAKE 1 TABLET BY MOUTH TWICE DAILY.  ? rosuvastatin (CRESTOR) 10 MG tablet Take 1 tablet (10 mg total) by mouth daily.  ? sertraline (ZOLOFT) 100 MG tablet Take 200 mg by mouth daily.  ? vitamin B-12 (CYANOCOBALAMIN) 1000 MCG tablet Take 1,000 mcg by mouth daily.  ? Vitamin D, Ergocalciferol, (DRISDOL) 1.25 MG (50000 UNIT) CAPS capsule Take 1 capsule (50,000 Units total) by mouth every 7 (seven) days.  ?  ? ?Allergies:   Cephalexin and Ubrogepant  ? ?Social History  ? ?Tobacco Use  ? Smoking status: Former  ?  Types: Cigarettes  ?  Quit date: 04/13/1989  ?  Years since quitting: 32.2  ? Smokeless tobacco: Never  ?  ? ?Family Hx: ?The patient's family history includes Cancer in his brother and father; Heart attack in his father; Hypertension in his mother and sister; Rheum arthritis in his mother. ? ?ROS:   ?Please see the history of present illness.    ? ?All other systems reviewed and are negative. ? ? ?Prior CV studies:   ?The following studies were reviewed today: ? ?Split-night sleep study and CPAP compliance download ? ?Labs/Other Tests and Data Reviewed:   ? ?EKG:  No ECG reviewed. ? ?Recent Labs: ?04/18/2021: ALT 32; Hemoglobin 15.4; Platelets 157; TSH 1.420 ?06/15/2021: BUN 12; Creatinine, Ser 1.01; Magnesium 2.7; Potassium 4.6; Sodium 144  ? ?Recent Lipid Panel ?Lab Results  ?Component Value Date/Time  ? CHOL 139 04/18/2021 10:50 AM  ? TRIG 102 04/18/2021 10:50 AM  ? HDL 35 (L) 04/18/2021 10:50 AM  ? CHOLHDL 4.0 04/18/2021 10:50 AM  ? CHOLHDL 6.2 04/07/2019 03:34 AM  ? LDLCALC 85 04/18/2021 10:50 AM  ? ? ?Wt Readings from Last 3 Encounters:  ?06/28/21 280 lb (127 kg)  ?06/15/21 288 lb (130.6 kg)  ?05/09/21 282 lb (127.9 kg)  ?  ? ?Risk Assessment/Calculations:   ?  ? ?     ?Objective:   ? ?Vital Signs:  BP 127/80   Ht 5\' 4"  (1.626 m)   Wt 280 lb (127 kg)   BMI 48.06 kg/m?   ? ?VITAL SIGNS:  reviewed ?GEN:  no acute distress ?EYES:  sclerae anicteric, EOMI - Extraocular Moveme

## 2021-06-28 NOTE — Telephone Encounter (Signed)
Patient's device is running out of water and has to fill it 3 times nightly - please get an in person DME visit she is in the to look at his device to make sure the humidity is working. ? ?He is not getting supplies either so that needs to be ordered ? ?Order placed to [facility] ?

## 2021-07-09 DIAGNOSIS — G4733 Obstructive sleep apnea (adult) (pediatric): Secondary | ICD-10-CM | POA: Diagnosis not present

## 2021-07-09 DIAGNOSIS — Z7409 Other reduced mobility: Secondary | ICD-10-CM | POA: Diagnosis not present

## 2021-07-09 DIAGNOSIS — R200 Anesthesia of skin: Secondary | ICD-10-CM | POA: Diagnosis not present

## 2021-07-20 ENCOUNTER — Telehealth: Payer: Self-pay | Admitting: Licensed Clinical Social Worker

## 2021-07-20 NOTE — Telephone Encounter (Signed)
Attempted to reach pt via telephone at [phone]. ?No answer, voicemail left requesting call back if any additional questions/concerns regarding hardship application. ? ?Octavio Graves, MSW, LCSW ?Clinical Social Worker II ?[place] Heart/Vascular Care Navigation  ?[phone]- work cell phone (preferred) ?[phone]- desk phone ? ?

## 2021-07-21 DIAGNOSIS — R69 Illness, unspecified: Secondary | ICD-10-CM | POA: Diagnosis not present

## 2021-07-21 DIAGNOSIS — F332 Major depressive disorder, recurrent severe without psychotic features: Secondary | ICD-10-CM | POA: Diagnosis not present

## 2021-07-26 ENCOUNTER — Telehealth: Payer: 59 | Admitting: Cardiology

## 2021-07-28 ENCOUNTER — Other Ambulatory Visit: Payer: Self-pay | Admitting: Physician Assistant

## 2021-07-28 ENCOUNTER — Encounter: Payer: Self-pay | Admitting: Cardiology

## 2021-07-28 ENCOUNTER — Telehealth: Payer: Self-pay | Admitting: Licensed Clinical Social Worker

## 2021-07-28 DIAGNOSIS — R69 Illness, unspecified: Secondary | ICD-10-CM | POA: Diagnosis not present

## 2021-07-28 DIAGNOSIS — M791 Myalgia, unspecified site: Secondary | ICD-10-CM

## 2021-07-28 MED ORDER — ROSUVASTATIN CALCIUM 10 MG PO TABS: 10.0000 mg | ORAL_TABLET | Freq: Every day | ORAL | 3 refills | Status: DC

## 2021-07-28 NOTE — Telephone Encounter (Signed)
LCSW was able to reach pt and pt wife this afternoon. ?They confirm again receipt of application- I shared again how to complete and forms to gather and how to submit. Pt and pt wife encouraged strongly to call me if any questions or concerns.  ? ?LCSW also confirmed My Chart refill request had been completed and triage RN had responded back to pt. No additional questions at this time.  ? ?Westley Hummer, MSW, LCSW ?Clinical Social Worker II ?[place] Heart/Vascular Care Navigation  ?[phone]- work cell phone (preferred) ?[phone]- desk phone ? ?

## 2021-08-08 DIAGNOSIS — R569 Unspecified convulsions: Secondary | ICD-10-CM | POA: Diagnosis not present

## 2021-08-08 DIAGNOSIS — R200 Anesthesia of skin: Secondary | ICD-10-CM | POA: Diagnosis not present

## 2021-08-08 DIAGNOSIS — R69 Illness, unspecified: Secondary | ICD-10-CM | POA: Diagnosis not present

## 2021-08-08 DIAGNOSIS — G8929 Other chronic pain: Secondary | ICD-10-CM | POA: Diagnosis not present

## 2021-08-08 DIAGNOSIS — R519 Headache, unspecified: Secondary | ICD-10-CM | POA: Diagnosis not present

## 2021-08-08 DIAGNOSIS — G4733 Obstructive sleep apnea (adult) (pediatric): Secondary | ICD-10-CM | POA: Diagnosis not present

## 2021-08-08 DIAGNOSIS — Z7409 Other reduced mobility: Secondary | ICD-10-CM | POA: Diagnosis not present

## 2021-08-12 IMAGING — MR MR HEAD W/O CM
10 series · 48 of 48 positions shown · non-contrast
Comparison: Head CT 05/26/2020 and MRI 12/25/2005

CLINICAL DATA: Neurological deficit. Left lower extremity and
bilateral upper extremity weakness. Neck pain.

EXAM:
MRI HEAD WITHOUT CONTRAST
TECHNIQUE: Multiplanar, multiecho pulse sequences of the brain and surrounding
structures were obtained without intravenous contrast.

[Series 3: t1_se_sag rep · sagittal · 5.0mm · 0.45mm/px · 3 of 23 slices shown]
[im 1/23]
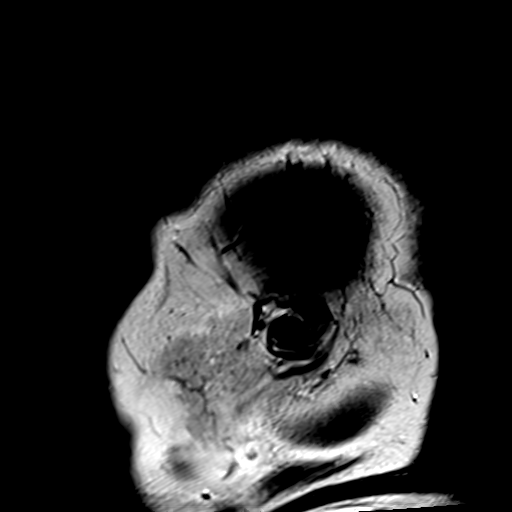
[im 12/23]
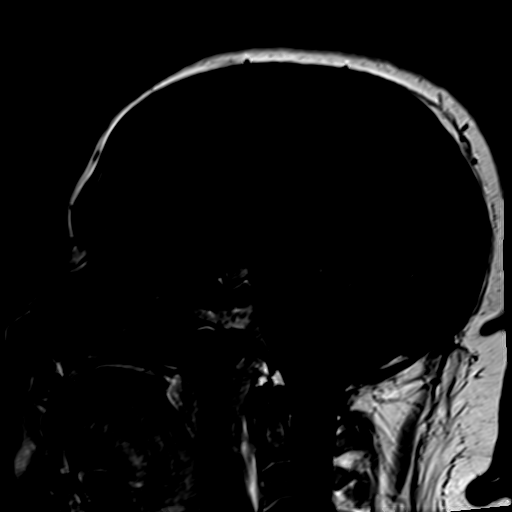
[im 23/23]
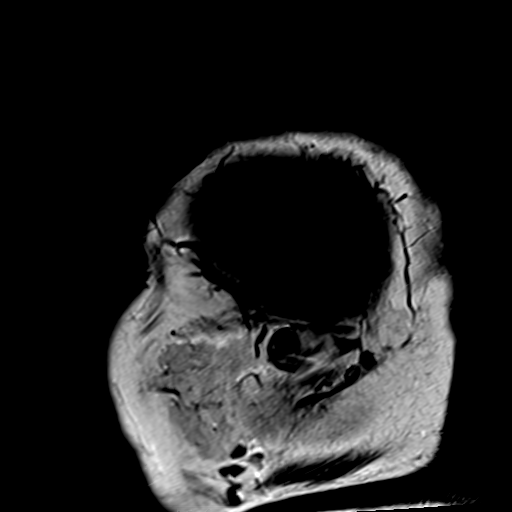

[Series 4: ep2d_diff_3 · axial · 3.0mm · 1.80mm/px · z∈[-46,+92]mm · 8 of 92 slices shown]
[im 1/92]
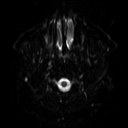
[im 14/92]
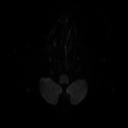
[im 27/92]
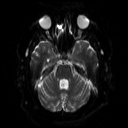
[im 40/92]
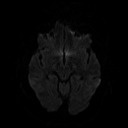
[im 53/92]
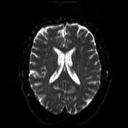
[im 66/92]
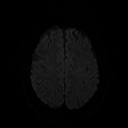
[im 79/92]
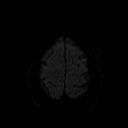
[im 92/92]
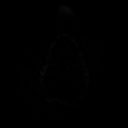

[Series 5: ep2d_diff_3_adc · axial · 3.0mm · 1.80mm/px · z∈[-46,+92]mm · 4 of 46 slices shown]
[im 1/46]
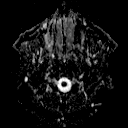
[im 16/46]
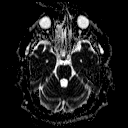
[im 31/46]
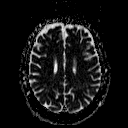
[im 46/46]
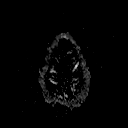

[Series 6: ep2d_diff_cor · coronal · 5.0mm · 1.77mm/px · 5 of 55 slices shown]
[im 1/55]
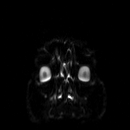
[im 14/55]
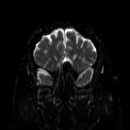
[im 28/55]
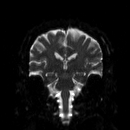
[im 41/55]
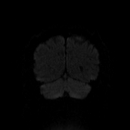
[im 55/55]
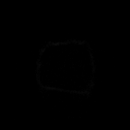

[Series 7: ep2d_diff_cor_adc · coronal · 5.0mm · 1.77mm/px · 2 of 28 slices shown]
[im 1/28]
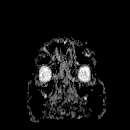
[im 28/28]
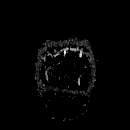

[Series 9: swi_images · axial · 2.0mm · 0.90mm/px · z∈[-47,+92]mm · 6 of 72 slices shown]
[im 1/72]
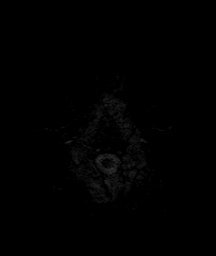
[im 15/72]
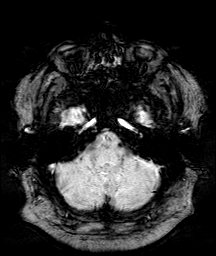
[im 29/72]
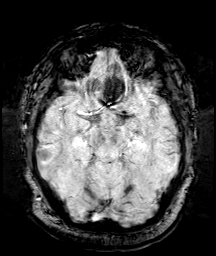
[im 43/72]
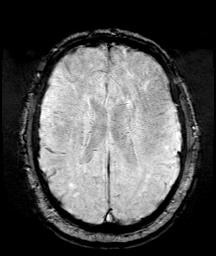
[im 57/72]
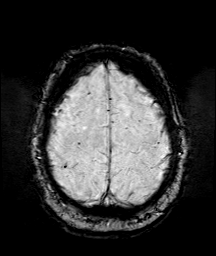
[im 72/72]
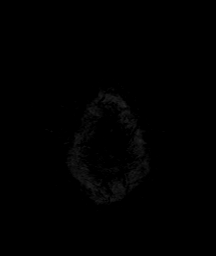

[Series 10: FLAIR · axial · 3.0mm · 0.43mm/px · z∈[-40,+84]mm · 3 of 33 slices shown]
[im 1/33]
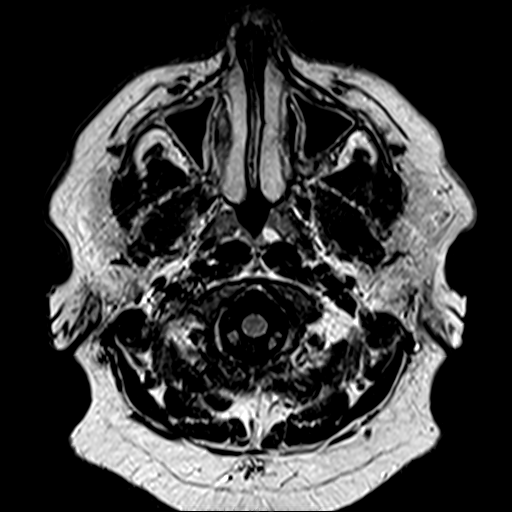
[im 17/33]
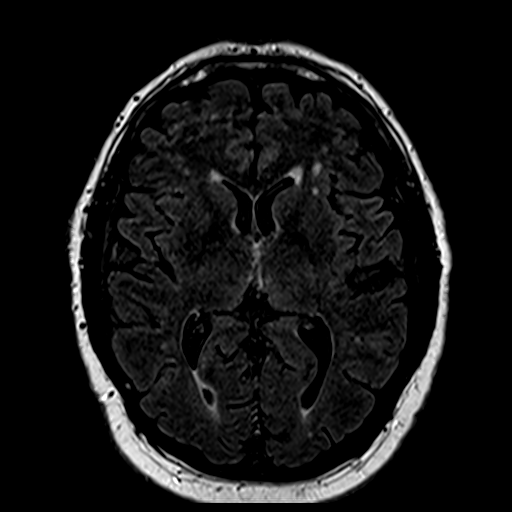
[im 33/33]
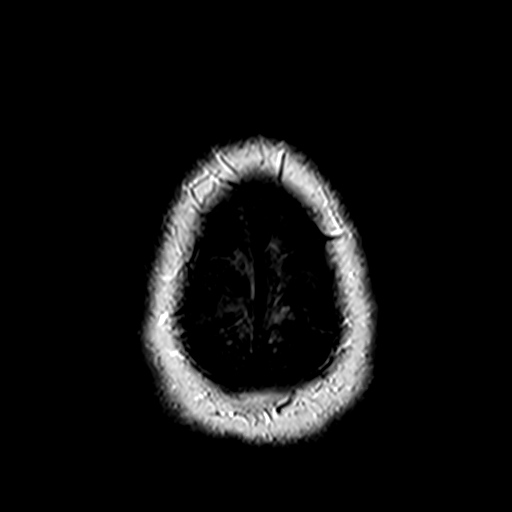

[Series 11: t2_tse_tra_512 · axial · 5.0mm · 0.72mm/px · z∈[-45,+90]mm · 2 of 24 slices shown]
[im 1/24]
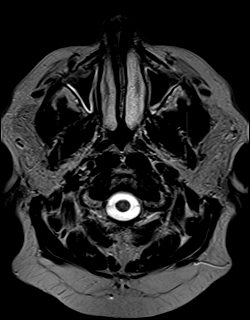
[im 24/24]
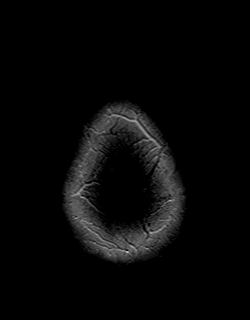

[Series 12: t1_mpr_tra · axial · 1.0mm · 0.72mm/px · z∈[-48,+92]mm · 13 of 144 slices shown]
[im 1/144]
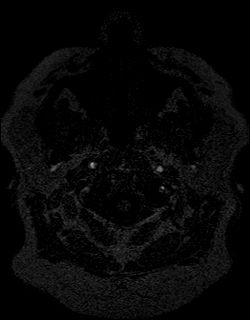
[im 12/144]
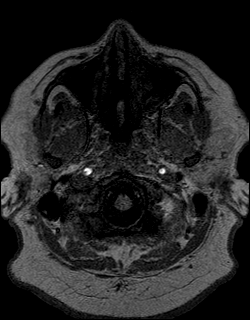
[im 24/144]
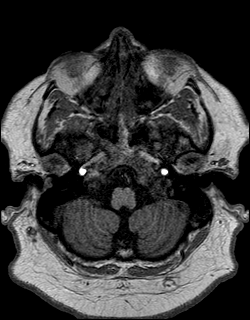
[im 36/144]
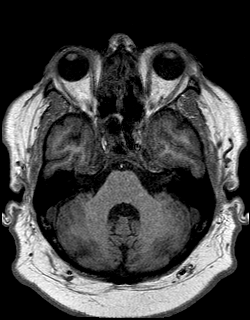
[im 48/144]
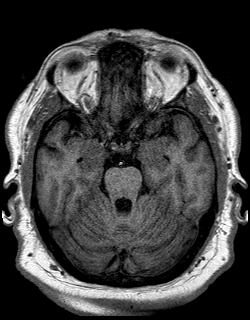
[im 60/144]
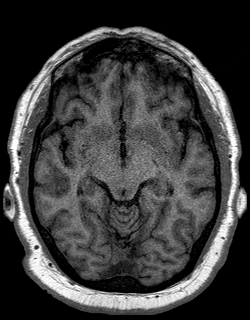
[im 72/144]
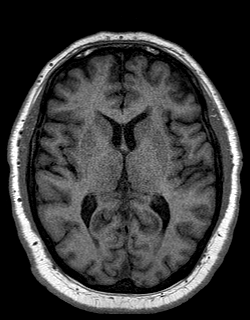
[im 84/144]
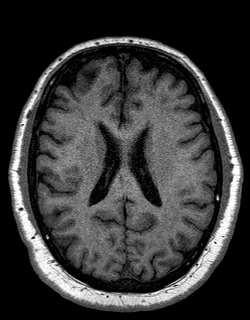
[im 96/144]
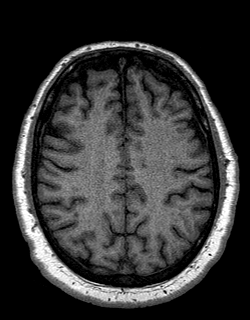
[im 108/144]
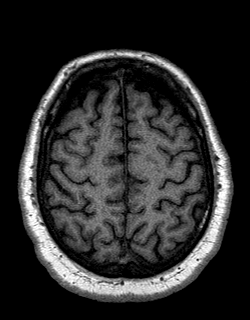
[im 120/144]
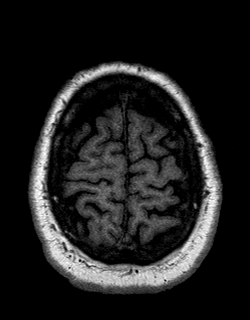
[im 132/144]
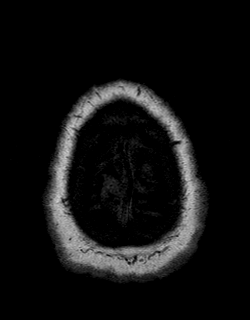
[im 144/144]
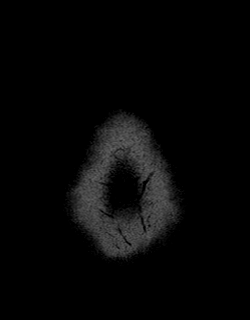

[Series 13: T2 · coronal · 5.0mm · 0.45mm/px · 2 of 28 slices shown]
[im 1/28]
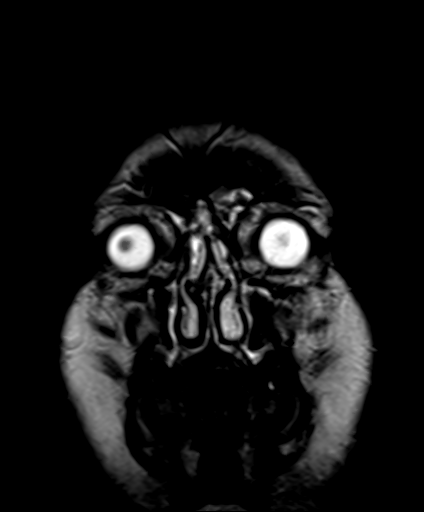
[im 28/28]
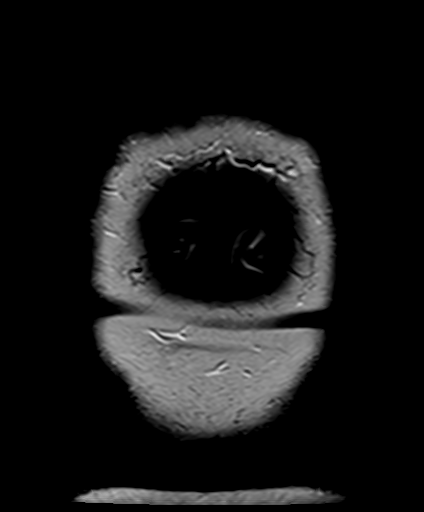

[48 of 48 positions shown; findings below may reference images not displayed]

FINDINGS: Brain: There is no evidence of an acute infarct, intracranial
hemorrhage, mass, midline shift, or extra-axial fluid collection.
Patchy T2 hyperintensities in the subcortical and deep cerebral
white matter bilaterally have mildly progressed from the prior MRI.
The periventricular white matter is relatively spared. There is mild
cerebral and cerebellar atrophy.

Vascular: Major intracranial vascular flow voids are preserved.

Skull and upper cervical spine: Unremarkable bone marrow signal.

Sinuses/Orbits: Unremarkable orbits. Mucosal thickening throughout
the paranasal sinuses, greatest in the ethmoid sinuses with evidence
of prior sinus surgery. Clear mastoid air cells.

Other: None.
IMPRESSION: 1. No acute intracranial abnormality.
2. Moderately age advanced cerebral white matter disease, mildly
progressed from 6999 and nonspecific. Considerations include chronic
small vessel ischemia, vasculitis, demyelinating disease, or an
inflammatory condition.

## 2021-08-17 DIAGNOSIS — G4733 Obstructive sleep apnea (adult) (pediatric): Secondary | ICD-10-CM | POA: Diagnosis not present

## 2021-08-23 ENCOUNTER — Ambulatory Visit (INDEPENDENT_AMBULATORY_CARE_PROVIDER_SITE_OTHER): Payer: 59 | Admitting: Physician Assistant

## 2021-08-23 ENCOUNTER — Encounter: Payer: Self-pay | Admitting: Physician Assistant

## 2021-08-23 VITALS — BP 110/82 | HR 83 | Temp 97.8°F | Ht 64.0 in | Wt 288.0 lb

## 2021-08-23 DIAGNOSIS — K219 Gastro-esophageal reflux disease without esophagitis: Secondary | ICD-10-CM

## 2021-08-23 DIAGNOSIS — F419 Anxiety disorder, unspecified: Secondary | ICD-10-CM

## 2021-08-23 DIAGNOSIS — I10 Essential (primary) hypertension: Secondary | ICD-10-CM | POA: Diagnosis not present

## 2021-08-23 DIAGNOSIS — E782 Mixed hyperlipidemia: Secondary | ICD-10-CM | POA: Diagnosis not present

## 2021-08-23 DIAGNOSIS — G459 Transient cerebral ischemic attack, unspecified: Secondary | ICD-10-CM | POA: Diagnosis not present

## 2021-08-23 DIAGNOSIS — E559 Vitamin D deficiency, unspecified: Secondary | ICD-10-CM | POA: Diagnosis not present

## 2021-08-23 DIAGNOSIS — R7303 Prediabetes: Secondary | ICD-10-CM | POA: Diagnosis not present

## 2021-08-23 DIAGNOSIS — R69 Illness, unspecified: Secondary | ICD-10-CM | POA: Diagnosis not present

## 2021-08-23 NOTE — Progress Notes (Signed)
Established Patient Office Visit  Subjective:  Patient ID: Randall Rojas, male    DOB: 1960-08-10  Age: 61 y.o. MRN: [id number]  CC:  Chief Complaint  Patient presents with   Hyperlipidemia   Hypertension    HPI Randall Rojas presents for follow up of hypertension and hyperlipidemia  Mixed hyperlipidemia  Pt presents with hyperlipidemia. Compliance with treatment has been fair; The patient maintains a low cholesterol diet , follows up as directed ,  The patient denies experiencing any hypercholesterolemia related symptoms.  Evidenced based information based on history , exam, and other sources has been used  for decision making. Currently on crestor $RemoveBe'10mg'lIttmnCld$  qd  Pt has history of edema - he takes bumex $RemoveBef'2mg'PSkBqpBNVJ$  prn swelling along with potassiumm 46meq- states symptoms are stable at this time  Pt continues to follow with neurology for history of headaches , history of TIA - he does follow up with them regularly and was seen in past few weeks--- he possibly had another mild TIA several weeks ago His keppra was increased to $RemoveBefo'750mg'RxrbekwHqCf$  2 po qam and 2 po qpm As far as headaches he is taking aimovig Pt also sees counselor for anxiety regarding these issues as well and his buspar was recently increased to $RemoveBefo'10mg'spwRZJNAFJH$  bid and zoloft $RemoveBe'100mg'RZpfwRnGT$  to 2 po qd  Pt with history of joint pain - pt currently using tylenol and elavil - symptoms stable  Pt with history of GERD - pt using omeprazole as directed  Pt sees cardiology Dr Harriet Masson on a regular basis  -  Pt with history of CAD - currently on ASA $Remo'325mg'dOovg$  qd and ranexa $RemoveBe'500mg'YVfwmiDJE$   Next follow up appt 12/17/21  Pt with history of Vit D def - taking weekly supplement - due for labwork   Past Medical History:  Diagnosis Date   Acute laryngopharyngitis 11/11/2020   Acute URI 12/12/2019   Anxiety 06/17/2020   Arthralgia 06/17/2020   Asthma    Bilateral leg edema 06/23/2020   Chest pain 04/15/2015   Chronic diastolic heart failure ([place]) 08/05/2020   Chronic heart failure with  preserved ejection fraction ([place]) 07/11/2020   Coronary artery calcification seen on CT scan 04/15/2015   Coronary artery disease    Coronary artery disease involving native coronary artery of native heart without angina pectoris 07/11/2020   Coronary artery disease of native artery of native heart with stable angina pectoris ([place]) 06/23/2020   Essential hypertension 04/15/2015   Exertional dyspnea 05/26/2020   Frequent headaches 07/20/2020   GERD (gastroesophageal reflux disease) 05/27/2019   History of CVA in adulthood 08/05/2020   HLD (hyperlipidemia) 07/11/2020   Hyperglycemia 06/06/2015   Hyperlipidemia 04/15/2015   Hypertension 08/05/2020   Hypokalemia 07/20/2020   Left sided numbness 07/25/2020   Leukocytosis 06/06/2015   Malaise 06/17/2020   Mixed hyperlipidemia 04/15/2015   Morbid obesity ([place]) 04/15/2015   Need for prophylactic vaccination against Streptococcus pneumoniae (pneumococcus) 12/03/2019   Need for prophylactic vaccination and inoculation against influenza 12/03/2019   Need for tetanus booster 06/17/2020   Nonepileptic episode ([place]) 10/20/2020   Obesity, Class III, BMI 40-49.9 (morbid obesity) ([place]) 04/15/2015   Other sleep apnea 07/20/2020   Palpitations 08/05/2020   Pneumonia due to COVID-19 virus 04/06/2019   Prediabetes 06/23/2020   Prostate cancer screening 06/02/2019   Rectal bleeding 11/01/2020   Seizure-like activity ([place]) 05/26/2020   Shortness of breath 06/23/2020   TIA (transient ischemic attack)    Upper extremity weakness 05/26/2020   Weakness of left lower  extremity 05/26/2020    Past Surgical History:  Procedure Laterality Date   CARDIAC CATHETERIZATION N/A 04/22/2015   Procedure: Left Heart Cath and Coronary Angiography;  Surgeon: Peter M Martinique, MD;  Location: [facility];  Service: Cardiovascular;  Laterality: N/A;   GALLBLADDER SURGERY     LEFT HEART CATH AND CORONARY ANGIOGRAPHY N/A 10/28/2020   Procedure: LEFT HEART CATH AND CORONARY ANGIOGRAPHY;  Surgeon: Troy Sine, MD;  Location: [facility];  Service: Cardiovascular;  Laterality: N/A;   SINUS SURGERY WITH INSTATRAK      Family History  Problem Relation Age of Onset   Hypertension Mother    Rheum arthritis Mother    Cancer Father    Heart attack Father    Cancer Brother    Hypertension Sister     Social History   Socioeconomic History   Marital status: Married    Spouse name: Not on file   Number of children: Not on file   Years of education: Not on file   Highest education level: Not on file  Occupational History   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  Tobacco Use   Smoking status: Former    Types: Cigarettes    Quit date: 04/13/1989    Years since quitting: 32.3   Smokeless tobacco: Never  Substance and Sexual Activity   Alcohol use: Not on file   Drug use: Not on file   Sexual activity: Not on file  Other Topics Concern   Not on file  Social History Narrative   Epworth Sleepiness Scale = 7 (as of 1/18/207)   Social Determinants of Health   Financial Resource Strain: High Risk   Difficulty of Paying Living Expenses: Hard  Food Insecurity: Not on file  Transportation Needs: No Transportation Needs   Lack of Transportation (Medical): No   Lack of Transportation (Non-Medical): No  Physical Activity: Not on file  Stress: Not on file  Social Connections: Not on file  Intimate Partner Violence: Not on file     Current Outpatient Medications:    acetaminophen (TYLENOL) 500 MG tablet, Take by mouth., Disp: , Rfl:    AIMOVIG 140 MG/ML SOAJ, SMARTSIG:140 Milligram(s) SUB-Q Once a Month, Disp: , Rfl:    albuterol (VENTOLIN HFA) 108 (90 Base) MCG/ACT inhaler, INHALE 2 PUFFS INTO THE LUNGS EVERY 6 HOURS AS NEEDED FOR WHEEZING OR SHORTNESS OF BREATH., Disp: 8.5 g, Rfl: 1   amitriptyline (ELAVIL) 25 MG tablet, TAKE 1 TABLET BY MOUTH DAILY AT BEDTIME., Disp: 30 tablet, Rfl: 2   aspirin 325 MG tablet, Take 325 mg by mouth daily., Disp: , Rfl:    baclofen (LIORESAL) 10 MG tablet,  Take 10-20 tablets by mouth every 6 (six) hours as needed (pain)., Disp: , Rfl:    bumetanide (BUMEX) 2 MG tablet, Take 1 tablet (2 mg total) by mouth 2 (two) times daily., Disp: 180 tablet, Rfl: 3   busPIRone (BUSPAR) 10 MG tablet, Take 10 mg by mouth 2 (two) times daily., Disp: , Rfl:    busPIRone (BUSPAR) 5 MG tablet, Take 10 mg by mouth 2 (two) times daily., Disp: , Rfl:    ketoconazole (NIZORAL) 2 % shampoo, Apply topically., Disp: , Rfl:    levETIRAcetam (KEPPRA) 750 MG tablet, Take 1,500 mg by mouth 2 (two) times daily., Disp: , Rfl:    meloxicam (MOBIC) 15 MG tablet, Take 15 mg by mouth daily., Disp: , Rfl:    nitroGLYCERIN (NITROSTAT) 0.4 MG SL tablet, Place 1  tablet (0.4 mg total) under the tongue every 5 (five) minutes as needed for chest pain. Up to three times., Disp: 45 tablet, Rfl: 3   omeprazole (PRILOSEC) 40 MG capsule, TAKE 1 CAPSULE BY MOUTH ONCE DAILY, Disp: 30 capsule, Rfl: 3   promethazine (PHENERGAN) 25 MG tablet, Take 25 mg by mouth every 6 (six) hours as needed for nausea or vomiting., Disp: , Rfl:    Pyridoxine HCl (B-6 PO), Take 100 mg by mouth daily., Disp: , Rfl:    ranolazine (RANEXA) 500 MG 12 hr tablet, TAKE 1 TABLET BY MOUTH TWICE DAILY., Disp: 180 tablet, Rfl: 1   rosuvastatin (CRESTOR) 10 MG tablet, Take 1 tablet (10 mg total) by mouth daily., Disp: 90 tablet, Rfl: 3   sertraline (ZOLOFT) 100 MG tablet, Take 200 mg by mouth daily., Disp: , Rfl:    vitamin B-12 (CYANOCOBALAMIN) 1000 MCG tablet, Take 1,000 mcg by mouth daily., Disp: , Rfl:    Vitamin D, Ergocalciferol, (DRISDOL) 1.25 MG (50000 UNIT) CAPS capsule, Take 1 capsule (50,000 Units total) by mouth every 7 (seven) days., Disp: 12 capsule, Rfl: 1   potassium chloride SA (KLOR-CON) 20 MEQ tablet, Take 1 tablet (20 mEq total) by mouth 2 (two) times daily., Disp: 180 tablet, Rfl: 3   Allergies  Allergen Reactions   Cephalexin Nausea And Vomiting and Rash   Ubrogepant Swelling   CONSTITUTIONAL: Negative  for chills, fatigue, fever, unintentional weight gain and unintentional weight loss.  E/N/T: Negative for ear pain, nasal congestion and sore throat.  CARDIOVASCULAR: Negative for chest pain, dizziness, palpitations and pedal edema.  RESPIRATORY: Negative for recent cough and dyspnea.  GASTROINTESTINAL: Negative for abdominal pain, acid reflux symptoms, constipation, diarrhea, nausea and vomiting.  MSK: Negative for arthralgias and myalgias.  INTEGUMENTARY: Negative for rash.  NEUROLOGICAL: see HPI PSYCHIATRIC: Negative for sleep disturbance and to question depression screen.  Negative for depression, negative for anhedonia.        Objective:  PHYSICAL EXAM:   VS: BP 110/82 (BP Location: Left Arm, Patient Position: Sitting)   Pulse 83   Temp 97.8 F (36.6 C) (Temporal)   Ht $R'5\' 4"'oQ$  (1.626 m)   Wt 288 lb (130.6 kg)   SpO2 94%   BMI 49.44 kg/m   GEN: Well nourished, well developed, in no acute distress  Cardiac: RRR; no murmurs,  Respiratory:  normal respiratory rate and pattern with no distress - normal breath sounds with no rales, rhonchi, wheezes or rubs Skin: warm and dry, no rash  Neuro:  Alert and Oriented x 3, - CN II-Xii grossly intact Psych: euthymic mood, appropriate affect and demeanor    There are no preventive care reminders to display for this patient.  Lab Results  Component Value Date   TSH 1.420 04/18/2021   Lab Results  Component Value Date   WBC 7.8 04/18/2021   HGB 15.4 04/18/2021   HCT 45.4 04/18/2021   MCV 93 04/18/2021   PLT 157 04/18/2021   Lab Results  Component Value Date   NA 144 06/15/2021   K 4.6 06/15/2021   CO2 23 06/15/2021   GLUCOSE 97 06/15/2021   BUN 12 06/15/2021   CREATININE 1.01 06/15/2021   BILITOT 0.8 04/18/2021   ALKPHOS 108 04/18/2021   AST 28 04/18/2021   ALT 32 04/18/2021   PROT 6.7 04/18/2021   ALBUMIN 4.3 04/18/2021   CALCIUM 8.7 06/15/2021   ANIONGAP 11 04/07/2019   EGFR 85 06/15/2021   Lab Results   Component  Value Date   CHOL 139 04/18/2021   Lab Results  Component Value Date   HDL 35 (L) 04/18/2021   Lab Results  Component Value Date   LDLCALC 85 04/18/2021   Lab Results  Component Value Date   TRIG 102 04/18/2021   Lab Results  Component Value Date   CHOLHDL 4.0 04/18/2021   Lab Results  Component Value Date   HGBA1C 5.9 (H) 04/18/2021      Assessment & Plan:   Problem List Items Addressed This Visit                                    Mixed hyperlipidemia   Relevant Medications   isosorbide mononitrate (IMDUR) 15 mg TB24 24 hr tablet Continue crestor $RemoveBeforeDE'10mg'XlrZmqbNXgBbQNX$  qd   Other Relevant Orders   Lipid panel Continue meds Watch diet    Hx TIA Continue meds and follow up with neurology as directed Continue ASA  GERD Rx for omeprazole as directed   CAD Continue meds as directed Labwork pending Follow up with cardiology as directed  Anxiety Continue buspar and zoloft as directed  Vit D def Continue supplement Labwork pending                          No orders of the defined types were placed in this encounter.    Follow-up: Return in about 4 months (around 12/24/2021) for chronic fasting follow up.    SARA R Haynes Giannotti, PA-C

## 2021-08-24 LAB — LIPID PANEL
Chol/HDL Ratio: 4 ratio (ref 0.0–5.0)
Cholesterol, Total: 147 mg/dL (ref 100–199)
HDL: 37 mg/dL — ABNORMAL LOW (ref >39)
LDL Chol Calc (NIH): 84 mg/dL (ref 0–99)
Triglycerides: 147 mg/dL (ref 0–149)
VLDL Cholesterol Cal: 26 mg/dL (ref 5–40)

## 2021-08-24 LAB — COMPREHENSIVE METABOLIC PANEL
ALT: 29 IU/L (ref 0–44)
AST: 25 IU/L (ref 0–40)
Albumin/Globulin Ratio: 1.8 (ref 1.2–2.2)
Albumin: 4.4 g/dL (ref 3.8–4.9)
Alkaline Phosphatase: 109 IU/L (ref 44–121)
BUN/Creatinine Ratio: 11 (ref 10–24)
BUN: 13 mg/dL (ref 8–27)
Bilirubin Total: 0.6 mg/dL (ref 0.0–1.2)
CO2: 27 mmol/L (ref 20–29)
Calcium: 9.8 mg/dL (ref 8.6–10.2)
Chloride: 99 mmol/L (ref 96–106)
Creatinine, Ser: 1.23 mg/dL (ref 0.76–1.27)
Globulin, Total: 2.4 g/dL (ref 1.5–4.5)
Glucose: 94 mg/dL (ref 70–99)
Potassium: 4.7 mmol/L (ref 3.5–5.2)
Sodium: 141 mmol/L (ref 134–144)
Total Protein: 6.8 g/dL (ref 6.0–8.5)
eGFR: 67 mL/min/{1.73_m2} (ref >59)

## 2021-08-24 LAB — CBC WITH DIFFERENTIAL/PLATELET
Basophils Absolute: 0.1 10*3/uL (ref 0.0–0.2)
Basos: 1 %
EOS (ABSOLUTE): 0.3 10*3/uL (ref 0.0–0.4)
Eos: 3 %
Hematocrit: 47.7 % (ref 37.5–51.0)
Hemoglobin: 15.8 g/dL (ref 13.0–17.7)
Immature Grans (Abs): 0 10*3/uL (ref 0.0–0.1)
Immature Granulocytes: 0 %
Lymphocytes Absolute: 3.2 10*3/uL — ABNORMAL HIGH (ref 0.7–3.1)
Lymphs: 35 %
MCH: 31 pg (ref 26.6–33.0)
MCHC: 33.1 g/dL (ref 31.5–35.7)
MCV: 94 fL (ref 79–97)
Monocytes Absolute: 0.7 10*3/uL (ref 0.1–0.9)
Monocytes: 7 %
Neutrophils Absolute: 4.8 10*3/uL (ref 1.4–7.0)
Neutrophils: 54 %
Platelets: 170 10*3/uL (ref 150–450)
RBC: 5.1 x10E6/uL (ref 4.14–5.80)
RDW: 13.1 % (ref 11.6–15.4)
WBC: 9 10*3/uL (ref 3.4–10.8)

## 2021-08-24 LAB — VITAMIN D 25 HYDROXY (VIT D DEFICIENCY, FRACTURES): Vit D, 25-Hydroxy: 46.4 ng/mL (ref 30.0–100.0)

## 2021-08-24 LAB — TSH: TSH: 2.35 u[IU]/mL (ref 0.450–4.500)

## 2021-08-24 LAB — HEMOGLOBIN A1C
Est. average glucose Bld gHb Est-mCnc: 114 mg/dL
Hgb A1c MFr Bld: 5.6 % (ref 4.8–5.6)

## 2021-08-24 LAB — CARDIOVASCULAR RISK ASSESSMENT

## 2021-08-25 DIAGNOSIS — R69 Illness, unspecified: Secondary | ICD-10-CM | POA: Diagnosis not present

## 2021-09-01 DIAGNOSIS — F332 Major depressive disorder, recurrent severe without psychotic features: Secondary | ICD-10-CM | POA: Diagnosis not present

## 2021-09-01 DIAGNOSIS — R69 Illness, unspecified: Secondary | ICD-10-CM | POA: Diagnosis not present

## 2021-09-08 DIAGNOSIS — R200 Anesthesia of skin: Secondary | ICD-10-CM | POA: Diagnosis not present

## 2021-09-08 DIAGNOSIS — Z7409 Other reduced mobility: Secondary | ICD-10-CM | POA: Diagnosis not present

## 2021-09-08 DIAGNOSIS — G4733 Obstructive sleep apnea (adult) (pediatric): Secondary | ICD-10-CM | POA: Diagnosis not present

## 2021-09-18 ENCOUNTER — Other Ambulatory Visit: Payer: Self-pay | Admitting: Physician Assistant

## 2021-09-26 DIAGNOSIS — R69 Illness, unspecified: Secondary | ICD-10-CM | POA: Diagnosis not present

## 2021-10-02 NOTE — Progress Notes (Unsigned)
Virtual Visit via Video Note   This visit type was conducted due to national recommendations for restrictions regarding the COVID-19 Pandemic (e.g. social distancing) in an effort to limit this patient's exposure and mitigate transmission in our community.  Due to his co-morbid illnesses, this patient is at least at moderate risk for complications without adequate follow up.  This format is felt to be most appropriate for this patient at this time.  All issues noted in this document were discussed and addressed.  A limited physical exam was performed with this format.  Please refer to the patient's chart for his consent to telehealth for [facility].     Date:  10/02/2021   ID:  Randall Rojas, DOB 23-Apr-1960, MRN [id number] The patient was identified using 2 identifiers.  Patient Location: Home Provider Location: Office/Clinic   PCP:  Marianne Sofia, PA-C   [facility] Providers Cardiologist:  Thomasene Ripple, DO     Evaluation Performed:  Follow-Up Visit  Chief Complaint:  OSA  History of Present Illness:    Randall Rojas is a 61 y.o. male with a hx of anxiety, CAD, HTN, HLD who was referred for sleep study due to his complaints of SOB and morbid obesity.  He tells me that he was having problems with excessive daytime sleepiness and also was told that he snored and would wake up gasping for breath . He underwent split-night sleep study on 03/25/2021 showing Severe obstructive sleep apnea with an AHI of 29.7/h and oxygen saturations as low as 81%.  He was titrated to CPAP at 13 cm H2O.    He is doing well with his CPAP device and thinks that he has gotten used to it.  He tolerates the mask and feels the pressure is adequate.  Since going on CPAP he feels rested in the am and has no significant daytime sleepiness.  He denies any significant mouth or nasal dryness or nasal congestion.  He does not think that he snores.     Past Medical History:  Diagnosis Date   Acute  laryngopharyngitis 11/11/2020   Acute URI 12/12/2019   Anxiety 06/17/2020   Arthralgia 06/17/2020   Asthma    Bilateral leg edema 06/23/2020   Chest pain 04/15/2015   Chronic diastolic heart failure (HCC) 08/05/2020   Chronic heart failure with preserved ejection fraction ([facility]) 07/11/2020   Coronary artery calcification seen on CT scan 04/15/2015   Coronary artery disease    Coronary artery disease involving native coronary artery of native heart without angina pectoris 07/11/2020   Coronary artery disease of native artery of native heart with stable angina pectoris ([facility]) 06/23/2020   Essential hypertension 04/15/2015   Exertional dyspnea 05/26/2020   Frequent headaches 07/20/2020   GERD (gastroesophageal reflux disease) 05/27/2019   History of CVA in adulthood 08/05/2020   HLD (hyperlipidemia) 07/11/2020   Hyperglycemia 06/06/2015   Hyperlipidemia 04/15/2015   Hypertension 08/05/2020   Hypokalemia 07/20/2020   Left sided numbness 07/25/2020   Leukocytosis 06/06/2015   Malaise 06/17/2020   Mixed hyperlipidemia 04/15/2015   Morbid obesity (H[facility]) 04/15/2015   Need for prophylactic vaccination against Streptococcus pneumoniae (pneumococcus) 12/03/2019   Need for prophylactic vaccination and inoculation against influenza 12/03/2019   Need for tetanus booster 06/17/2020   Nonepileptic episode ([facility]) 10/20/2020   Obesity, Class III, BMI 40-49.9 (morbid obesity) ([facility]) 04/15/2015   Other sleep apnea 07/20/2020   Palpitations 08/05/2020   Pneumonia due to COVID-19 virus 04/06/2019   Prediabetes 06/23/2020  Prostate cancer screening 06/02/2019   Rectal bleeding 11/01/2020   Seizure-like activity ([facility]) 05/26/2020   Shortness of breath 06/23/2020   TIA (transient ischemic attack)    Upper extremity weakness 05/26/2020   Weakness of left lower extremity 05/26/2020   Past Surgical History:  Procedure Laterality Date   CARDIAC CATHETERIZATION N/A 04/22/2015   Procedure: Left Heart Cath and Coronary Angiography;  Surgeon: Peter M  Swazil[place], MD;  Location: [facility];  Service: Cardiovascular;  Laterality: N/A;   GALLBLADDER SURGERY     LEFT HEART CATH AND CORONARY ANGIOGRAPHY N/A 10/28/2020   Procedure: LEFT HEART CATH AND CORONARY ANGIOGRAPHY;  Surgeon: Lennette Bihari, MD;  Location: [facility];  Service: Cardiovascular;  Laterality: N/A;   SINUS SURGERY WITH INSTATRAK       No outpatient medications have been marked as taking for the 10/04/21 encounter (Appointment) with Quintella Reichert, MD.     Allergies:   Cephalexin and Ubrogepant   Social History   Tobacco Use   Smoking status: Former    Types: Cigarettes    Quit date: 04/13/1989    Years since quitting: 32.4   Smokeless tobacco: Never     Family Hx: The patient's family history includes Cancer in his brother and father; Heart attack in his father; Hypertension in his mother and sister; Rheum arthritis in his mother.  ROS:   Please see the history of present illness.     All other systems reviewed and are negative.   Prior CV studies:   The following studies were reviewed today:  Split-night sleep study and CPAP compliance download  Labs/Other Tests and Data Reviewed:    EKG:  No ECG reviewed.  Recent Labs: 06/15/2021: Magnesium 2.7 08/23/2021: ALT 29; BUN 13; Creatinine, Ser 1.23; Hemoglobin 15.8; Platelets 170; Potassium 4.7; Sodium 141; TSH 2.350   Recent Lipid Panel Lab Results  Component Value Date/Time   CHOL 147 08/23/2021 09:20 AM   TRIG 147 08/23/2021 09:20 AM   HDL 37 (L) 08/23/2021 09:20 AM   CHOLHDL 4.0 08/23/2021 09:20 AM   CHOLHDL 6.2 04/07/2019 03:34 AM   LDLCALC 84 08/23/2021 09:20 AM    Wt Readings from Last 3 Encounters:  08/23/21 288 lb (130.6 kg)  06/28/21 280 lb (127 kg)  06/15/21 288 lb (130.6 kg)     Risk Assessment/Calculations:          Objective:    Vital Signs:  There were no vitals taken for this visit.  Well nourished, well developed male in no acute distress. Well appearing, alert  and conversant, regular work of breathing,  good skin color  Eyes- anicteric mouth- oral mucosa is pink  neuro- grossly intact skin- no apparent rash or lesions or cyanosis  ASSESSMENT & PLAN:    OSA - The patient is tolerating PAP therapy well without any problems. The PAP download performed by his DME was personally reviewed and interpreted by me today and showed an AHI of ***/hr on *** cm H2O with ***% compliance in using more than 4 hours nightly.  The patient has been using and benefiting from PAP use and will continue to benefit from therapy.    Time:   Today, I have spent 15 minutes with the patient with telehealth technology discussing the above problems.    Followup with me in 3 months   Medication Adjustments/Labs and Tests Ordered: Current medicines are reviewed at length with the patient today.  Concerns regarding medicines are outlined above.  Tests Ordered: No orders of the defined types were placed in this encounter.   Medication Changes: No orders of the defined types were placed in this encounter.   Follow Up:  In Person in 1 year(s)  Signed, Armanda Magic, MD  10/02/2021 9:28 PM    [facility]

## 2021-10-04 ENCOUNTER — Telehealth (INDEPENDENT_AMBULATORY_CARE_PROVIDER_SITE_OTHER): Payer: 59 | Admitting: Cardiology

## 2021-10-04 ENCOUNTER — Encounter: Payer: Self-pay | Admitting: Cardiology

## 2021-10-04 VITALS — BP 120/82 | Ht 64.0 in | Wt 285.0 lb

## 2021-10-04 DIAGNOSIS — I10 Essential (primary) hypertension: Secondary | ICD-10-CM

## 2021-10-04 DIAGNOSIS — G4733 Obstructive sleep apnea (adult) (pediatric): Secondary | ICD-10-CM

## 2021-10-05 ENCOUNTER — Telehealth: Payer: Self-pay | Admitting: *Deleted

## 2021-10-05 DIAGNOSIS — G4733 Obstructive sleep apnea (adult) (pediatric): Secondary | ICD-10-CM

## 2021-10-05 DIAGNOSIS — R400 Somnolence: Secondary | ICD-10-CM

## 2021-10-05 NOTE — Telephone Encounter (Signed)
Order placed to [facility] for cpap supplies.

## 2021-10-05 NOTE — Telephone Encounter (Signed)
-----   Message from Theresia Majors, RN sent at 10/04/2021 10:19 AM EDT ----- Per Dr. Mayford Knife: please order CPAP supplies so he has enough to change out cushion evvery 4-6 weeks. Thanks!

## 2021-10-06 DIAGNOSIS — R69 Illness, unspecified: Secondary | ICD-10-CM | POA: Diagnosis not present

## 2021-10-06 DIAGNOSIS — F411 Generalized anxiety disorder: Secondary | ICD-10-CM | POA: Diagnosis not present

## 2021-10-08 DIAGNOSIS — R200 Anesthesia of skin: Secondary | ICD-10-CM | POA: Diagnosis not present

## 2021-10-08 DIAGNOSIS — Z7409 Other reduced mobility: Secondary | ICD-10-CM | POA: Diagnosis not present

## 2021-10-08 DIAGNOSIS — G4733 Obstructive sleep apnea (adult) (pediatric): Secondary | ICD-10-CM | POA: Diagnosis not present

## 2021-10-25 DIAGNOSIS — R69 Illness, unspecified: Secondary | ICD-10-CM | POA: Diagnosis not present

## 2021-10-26 ENCOUNTER — Other Ambulatory Visit: Payer: Self-pay | Admitting: Physician Assistant

## 2021-10-26 DIAGNOSIS — M791 Myalgia, unspecified site: Secondary | ICD-10-CM

## 2021-11-08 DIAGNOSIS — Z7409 Other reduced mobility: Secondary | ICD-10-CM | POA: Diagnosis not present

## 2021-11-08 DIAGNOSIS — G4733 Obstructive sleep apnea (adult) (pediatric): Secondary | ICD-10-CM | POA: Diagnosis not present

## 2021-11-08 DIAGNOSIS — R200 Anesthesia of skin: Secondary | ICD-10-CM | POA: Diagnosis not present

## 2021-11-16 DIAGNOSIS — G4733 Obstructive sleep apnea (adult) (pediatric): Secondary | ICD-10-CM | POA: Diagnosis not present

## 2021-11-23 DIAGNOSIS — R69 Illness, unspecified: Secondary | ICD-10-CM | POA: Diagnosis not present

## 2021-11-23 DIAGNOSIS — F332 Major depressive disorder, recurrent severe without psychotic features: Secondary | ICD-10-CM | POA: Diagnosis not present

## 2021-11-24 DIAGNOSIS — S0990XA Unspecified injury of head, initial encounter: Secondary | ICD-10-CM | POA: Diagnosis not present

## 2021-11-24 DIAGNOSIS — G8929 Other chronic pain: Secondary | ICD-10-CM | POA: Diagnosis not present

## 2021-11-24 DIAGNOSIS — R519 Headache, unspecified: Secondary | ICD-10-CM | POA: Diagnosis not present

## 2021-11-24 DIAGNOSIS — G4733 Obstructive sleep apnea (adult) (pediatric): Secondary | ICD-10-CM | POA: Diagnosis not present

## 2021-11-24 DIAGNOSIS — R69 Illness, unspecified: Secondary | ICD-10-CM | POA: Diagnosis not present

## 2021-11-24 DIAGNOSIS — R569 Unspecified convulsions: Secondary | ICD-10-CM | POA: Diagnosis not present

## 2021-11-24 DIAGNOSIS — R296 Repeated falls: Secondary | ICD-10-CM | POA: Diagnosis not present

## 2021-12-02 DIAGNOSIS — R519 Headache, unspecified: Secondary | ICD-10-CM | POA: Diagnosis not present

## 2021-12-09 DIAGNOSIS — R200 Anesthesia of skin: Secondary | ICD-10-CM | POA: Diagnosis not present

## 2021-12-09 DIAGNOSIS — G4733 Obstructive sleep apnea (adult) (pediatric): Secondary | ICD-10-CM | POA: Diagnosis not present

## 2021-12-09 DIAGNOSIS — Z7409 Other reduced mobility: Secondary | ICD-10-CM | POA: Diagnosis not present

## 2021-12-16 ENCOUNTER — Ambulatory Visit: Payer: 59 | Attending: Cardiology | Admitting: Cardiology

## 2021-12-16 ENCOUNTER — Encounter: Payer: Self-pay | Admitting: Cardiology

## 2021-12-16 VITALS — BP 119/83 | HR 82 | Ht 64.0 in | Wt 293.8 lb

## 2021-12-16 DIAGNOSIS — Z9989 Dependence on other enabling machines and devices: Secondary | ICD-10-CM | POA: Diagnosis not present

## 2021-12-16 DIAGNOSIS — G4733 Obstructive sleep apnea (adult) (pediatric): Secondary | ICD-10-CM | POA: Diagnosis not present

## 2021-12-16 DIAGNOSIS — G459 Transient cerebral ischemic attack, unspecified: Secondary | ICD-10-CM

## 2021-12-16 DIAGNOSIS — I5032 Chronic diastolic (congestive) heart failure: Secondary | ICD-10-CM

## 2021-12-16 DIAGNOSIS — I10 Essential (primary) hypertension: Secondary | ICD-10-CM | POA: Diagnosis not present

## 2021-12-16 DIAGNOSIS — I25118 Atherosclerotic heart disease of native coronary artery with other forms of angina pectoris: Secondary | ICD-10-CM

## 2021-12-16 MED ORDER — POTASSIUM CHLORIDE CRYS ER 20 MEQ PO TBCR: 20.0000 meq | EXTENDED_RELEASE_TABLET | Freq: Two times a day (BID) | ORAL | 3 refills | Status: DC

## 2021-12-16 MED ORDER — RANOLAZINE ER 500 MG PO TB12: 500.0000 mg | ORAL_TABLET | Freq: Two times a day (BID) | ORAL | 3 refills | Status: DC

## 2021-12-16 MED ORDER — BUMETANIDE 2 MG PO TABS: 2.0000 mg | ORAL_TABLET | Freq: Two times a day (BID) | ORAL | 3 refills | Status: DC

## 2021-12-16 MED ORDER — ROSUVASTATIN CALCIUM 10 MG PO TABS: 10.0000 mg | ORAL_TABLET | Freq: Every day | ORAL | 3 refills | Status: DC

## 2021-12-16 NOTE — Progress Notes (Signed)
Cardiology Office Note:    Date:  12/18/2021   ID:  Randall Rojas, DOB 14-Jan-1961, MRN [id number]  PCP:  Marianne Sofia, PA-C  Cardiologist:  Thomasene Ripple, DO  Electrophysiologist:  None   Referring MD: Marianne Sofia, PA-C   " I am ok"  History of Present Illness:    Randall Rojas is a 61 y.o. male with a hx of  coronary artery disease with recent nuclear stress test showing no evidence of ischemia, recent TIA the patient is on dual antiplatelet therapy, hypertension, hyperlipidemia, chronic diastolic heart failure with his recent EF in May 2022 55-60%.   I last saw the patient on Aug 05, 2020, and prior to that visit the patient reported that he had been admitted twice at the [facility] hospital first for concern for stroke.  Both visits there was concern for TIA.  The second visit which was on July 11, 2020 the patient has significant headache along with left-sided weakness and numbness.  He was then admitted to the [facility] hospital.  During that time he underwent multiple testing modalities his MRI demonstrated no acute cranial abnormalities, his CTA showed 50% stenosis of the right ICA and less than 50% stenosis of the left ICA.  The patient did see neurology while he was in the hospital he was started on dual antiplatelet therapy for 3 months and then will transition to aspirin only.  He is now following with neurology.  He was started on statins as well.   At the conclusion of his Aug 05, 2020 visit he reported that he was experiencing some fluttering at therefore place a monitor on the patient.  At that time he also appeared to be fluid overloaded we restarted his Lasix.   Since I last saw the patient he had been admitted at [facility] hospital with a headache concerns that the patient may be experiencing multiple episodes of seizure.  He is being worked up by neurology there.  He is planning on undergoing a inpatient continuous EEG monitoring with [facility].   I saw the patient in August 25, 2020 at that time we started him from Lasix to Bumex and he did not have any angina.  He ended up at [facility] where he was treated for heart failure and did have some chest pain which started Imdur on the patient.   On July 1 I saw the patient posthospitalization from [facility] where he was started on Imdur for chest pain.  He had had some rash on the Imdur so I stopped that and have the patient take ranolazine.   In July 2022he was hospitalized he had a inpatient stay at [facility] undergoing testing for for 3-day work-up for suspected seizures versus strokes.  He was told that he has many seizures but no epileptic seizures.   At his last visit October 27, 2018 with the patient experiencing significant chest pain with shortness of breath.  With this given his negative Lexiscan in April I sent the patient for right and left heart catheterization.  His catheterization essentially did show mild nonobstructive CAD unfortunately the right heart cath was not performed on sure why.  I saw the patient in December 14, 2020 at that time he states your company did not cover his Ranexa therefore I placed the patient on Imdur and he did not tolerate that medication.  During that visit he still is experiencing shortness of breath on exertion,  suspecting that untreated sleep apnea may be playing a role I referred the patient for sleep study as well as a pulmonary evaluation.  He did get a sleep study which revealed significant/severe sleep apnea.  He has since been started on the CPAP.  At his visit on 3/22/203 he had been diagnosed with CVA and has had some Achilles tendon pain and has been following with with orthopedics.  He was still short of breath - suspected that deconditioning was also playing a role. Since I saw the patient he had been approved for his disability - I am very happy for him.  Past Medical History:  Diagnosis Date    Acute laryngopharyngitis 11/11/2020   Acute URI 12/12/2019   Anxiety 06/17/2020   Arthralgia 06/17/2020   Asthma    Bilateral leg edema 06/23/2020   Chest pain 04/15/2015   Chronic diastolic heart failure (HCC) 08/05/2020   Chronic heart failure with preserved ejection fraction (HCC) 07/11/2020   Coronary artery calcification seen on CT scan 04/15/2015   Coronary artery disease    Coronary artery disease involving native coronary artery of native heart without angina pectoris 07/11/2020   Coronary artery disease of native artery of native heart with stable angina pectoris (HCC) 06/23/2020   Essential hypertension 04/15/2015   Exertional dyspnea 05/26/2020   Frequent headaches 07/20/2020   GERD (gastroesophageal reflux disease) 05/27/2019   History of CVA in adulthood 08/05/2020   HLD (hyperlipidemia) 07/11/2020   Hyperglycemia 06/06/2015   Hyperlipidemia 04/15/2015   Hypertension 08/05/2020   Hypokalemia 07/20/2020   Left sided numbness 07/25/2020   Leukocytosis 06/06/2015   Malaise 06/17/2020   Mixed hyperlipidemia 04/15/2015   Morbid obesity (HCC) 04/15/2015   Need for prophylactic vaccination against Streptococcus pneumoniae (pneumococcus) 12/03/2019   Need for prophylactic vaccination and inoculation against influenza 12/03/2019   Need for tetanus booster 06/17/2020   Nonepileptic episode (HCC) 10/20/2020   Obesity, Class III, BMI 40-49.9 (morbid obesity) (HCC) 04/15/2015   Other sleep apnea 07/20/2020   Palpitations 08/05/2020   Pneumonia due to COVID-19 virus 04/06/2019   Prediabetes 06/23/2020   Prostate cancer screening 06/02/2019   Rectal bleeding 11/01/2020   Seizure-like activity (HCC) 05/26/2020   Shortness of breath 06/23/2020   TIA (transient ischemic attack)    Upper extremity weakness 05/26/2020   Weakness of left lower extremity 05/26/2020    Past Surgical History:  Procedure Laterality Date   CARDIAC CATHETERIZATION N/A 04/22/2015   Procedure: Left Heart Cath and Coronary Angiography;  Surgeon: Peter  M Swaziland, MD;  Location: [facility] INVASIVE CV LAB;  Service: Cardiovascular;  Laterality: N/A;   GALLBLADDER SURGERY     LEFT HEART CATH AND CORONARY ANGIOGRAPHY N/A 10/28/2020   Procedure: LEFT HEART CATH AND CORONARY ANGIOGRAPHY;  Surgeon: Lennette Bihari, MD;  Location: [facility] INVASIVE CV LAB;  Service: Cardiovascular;  Laterality: N/A;   SINUS SURGERY WITH INSTATRAK      Current Medications: Current Meds  Medication Sig   acetaminophen (TYLENOL) 500 MG tablet Take by mouth.   AIMOVIG 140 MG/ML SOAJ SMARTSIG:140 Milligram(s) SUB-Q Once a Month   albuterol (VENTOLIN HFA) 108 (90 Base) MCG/ACT inhaler INHALE 2 PUFFS INTO THE LUNGS EVERY 6 HOURS AS NEEDED FOR WHEEZING OR SHORTNESS OF BREATH.   amitriptyline (ELAVIL) 50 MG tablet Take 50 mg by mouth at bedtime.   aspirin 325 MG tablet Take 325 mg by mouth daily.   baclofen (LIORESAL) 10 MG tablet Take 10-20 tablets by mouth every 6 (six) hours as needed (  pain).   busPIRone (BUSPAR) 10 MG tablet Take 10 mg by mouth 2 (two) times daily.   ketoconazole (NIZORAL) 2 % shampoo Apply topically.   levETIRAcetam (KEPPRA) 750 MG tablet Take 1,500 mg by mouth 2 (two) times daily.   meloxicam (MOBIC) 15 MG tablet Take 15 mg by mouth daily.   nitroGLYCERIN (NITROSTAT) 0.4 MG SL tablet Place 1 tablet (0.4 mg total) under the tongue every 5 (five) minutes as needed for chest pain. Up to three times.   omeprazole (PRILOSEC) 40 MG capsule TAKE 1 CAPSULE BY MOUTH ONCE DAILY   promethazine (PHENERGAN) 25 MG tablet Take 25 mg by mouth every 6 (six) hours as needed for nausea or vomiting.   Pyridoxine HCl (B-6 PO) Take 100 mg by mouth daily.   sertraline (ZOLOFT) 100 MG tablet Take 200 mg by mouth daily.   sertraline (ZOLOFT) 50 MG tablet Take 50 mg by mouth daily.   vitamin B-12 (CYANOCOBALAMIN) 1000 MCG tablet Take 1,000 mcg by mouth daily.   Vitamin D, Ergocalciferol, (DRISDOL) 1.25 MG (50000 UNIT) CAPS capsule TAKE 1 CAPSULE BY MOUTH EVERY 7 DAYS   [DISCONTINUED]  bumetanide (BUMEX) 2 MG tablet Take 1 tablet (2 mg total) by mouth 2 (two) times daily.   [DISCONTINUED] potassium chloride SA (KLOR-CON) 20 MEQ tablet Take 1 tablet (20 mEq total) by mouth 2 (two) times daily.   [DISCONTINUED] ranolazine (RANEXA) 500 MG 12 hr tablet TAKE 1 TABLET BY MOUTH TWICE DAILY.   [DISCONTINUED] rosuvastatin (CRESTOR) 10 MG tablet Take 1 tablet (10 mg total) by mouth daily.     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  Tobacco Use   Smoking status: Former    Types: Cigarettes    Quit date: 04/13/1989    Years since quitting: 32.7   Smokeless tobacco: Never  Substance and Sexual Activity   Alcohol use: Not on file   Drug use: Not on file   Sexual activity: Not on file  Other Topics Concern   Not on file  Social History Narrative   Epworth Sleepiness Scale = 7 (as of 1/18/207)   Social Determinants of Health   Financial Resource Strain: High Risk (06/17/2021)   Overall Financial Resource Strain (CARDIA)    Difficulty of Paying Living Expenses: Hard  Food Insecurity: Not on file  Transportation Needs: No Transportation Needs (06/17/2021)   PRAPARE - Hydrologist (Medical): No    Lack of Transportation (Non-Medical): No  Physical Activity: Not on file  Stress: Not on file  Social Connections: Not on file     Family History: The patient's family history includes Cancer in his brother and father; Heart attack in his father; Hypertension in his mother and sister; Rheum arthritis in his mother.  ROS:   Review of Systems  Constitution: Negative for decreased appetite, fever and weight gain.  HENT: Negative for congestion, ear discharge, hoarse voice and sore throat.   Eyes:  Negative for discharge, redness, vision loss in right eye and visual halos.  Cardiovascular: Negative for chest pain, dyspnea on exertion, leg swelling, orthopnea and palpitations.  Respiratory: Negative for cough, hemoptysis, shortness of breath and snoring.  Endocrine: Negative for heat intolerance and polyphagia.  Hematologic/Lymphatic: Negative for bleeding problem. Does not bruise/bleed easily.  Skin: Negative for flushing, nail changes, rash and suspicious lesions.  Musculoskeletal: Negative for arthritis, joint pain, muscle cramps, myalgias, neck pain and stiffness.  Gastrointestinal: Negative for abdominal pain, bowel incontinence, diarrhea and excessive appetite.  Genitourinary: Negative for decreased libido, genital sores and incomplete emptying.  Neurological: Negative for brief paralysis, focal weakness, headaches and loss of balance.  Psychiatric/Behavioral: Negative for altered mental status, depression and suicidal ideas.  Allergic/Immunologic: Negative for HIV exposure and persistent infections.    EKGs/Labs/Other Studies Reviewed:    The following studies were reviewed today:   EKG:  The ekg ordered today demonstrates sinus rhythm HR 76 bpm.  Left heart catheterization October 28, 2020 1st Diag lesion is 40% stenosed.   Mid LAD lesion is 10% stenosed.   The left ventricular systolic function is normal.   LV end diastolic pressure is mildly elevated.   The left ventricular ejection fraction is 50-55% by visual estimate.  Mild nonobstructive CAD with 40% proximal stenosis in the first diagonal branch of the LAD with mild smooth luminal 10% mid LAD narrowing; normal left circumflex coronary artery; normal dominant RCA.   Low normal LV function with EF estimated 50 to 55% without focal segmental wall motion abnormality.  LVEDP 21 mm.   RECOMMENDATION: Medical therapy for mild nonobstructive CAD.  Aggressive lipid-lowering therapy with target LDL less than 70.    ZIO  monitor Aug 13, 2020 Patch Wear Time:  3 days and 10 hours starting Aug 05, 2020 Indication: Palpitations   Patient had a min HR of 51 bpm, max HR of 120 bpm, and avg HR of 83 bpm.    Predominant underlying rhythm was Sinus Rhythm.   Premature ventricular complexes were rare.   Premature atrial complex were rare.   No ventricular tachycardia, no pauses, no supraventricular tachycardia no atrial fibrillation noted.   Symptoms associated with sinus rhythm.   Conclusion: Normal/unremarkable study with no evidence of significant arrhythmia  Recent Labs: 06/15/2021: Magnesium 2.7 08/23/2021: ALT 29; BUN 13; Creatinine, Ser 1.23; Hemoglobin 15.8; Platelets 170; Potassium 4.7; Sodium 141; TSH 2.350  Recent Lipid Panel    Component Value Date/Time   CHOL 147 08/23/2021 0920   TRIG 147 08/23/2021 0920   HDL 37 (L) 08/23/2021 0920   CHOLHDL 4.0 08/23/2021 0920   CHOLHDL 6.2 04/07/2019 0334   VLDL 16 04/07/2019 0334   LDLCALC 84 08/23/2021 0920    Physical Exam:    VS:  BP 119/83   Pulse 82   Ht 5\' 4"  (1.626 m)   Wt 293 lb 12.8 oz (133.3 kg)   SpO2 97%   BMI 50.43 kg/m     Wt Readings from Last 3 Encounters:  12/16/21 293 lb 12.8 oz (133.3 kg)  10/04/21 285 lb (129.3 kg)  08/23/21 288 lb (130.6 kg)     GEN: Well nourished, well developed in no acute distress HEENT: Normal NECK: No JVD; No carotid bruits LYMPHATICS: No lymphadenopathy CARDIAC: S1S2 noted,RRR, no murmurs, rubs, gallops RESPIRATORY:  Clear to auscultation without rales, wheezing or rhonchi  ABDOMEN: Soft, non-tender, non-distended, +bowel sounds, no guarding. EXTREMITIES: No edema, No cyanosis, no clubbing MUSCULOSKELETAL:  No deformity  SKIN: Warm and dry NEUROLOGIC:  Alert and oriented x 3, non-focal PSYCHIATRIC:  Normal affect, good insight  ASSESSMENT:    1. TIA (transient ischemic attack)   2. Essential hypertension   3. Coronary artery disease  of native artery of native heart with stable angina  pectoris (HCC)   4. Chronic heart failure with preserved ejection fraction (HCC)   5. Chronic diastolic heart failure (HCC)   6. Primary hypertension   7. Obesity, Class III, BMI 40-49.9 (morbid obesity) (HCC)   8. OSA on CPAP   9. Morbid obesity (HCC)     PLAN:    No medication changes will be done today.   CAD - stable. Cont current regimen with aspirin, statin and antianginals.  HTN - at [facility] - continue statin, fu lipid profile LDL goal <55. Chronic diastolic heart failure - euvolemic OSA - cont cpap, follows with Dr. Turner.  The patient is in [place] [facility] with the above plan. The patient left the office in stable condition.  The patient will follow up in    Medication Adjustments/Labs and Tests Ordered: Current medicines are reviewed at length with the patient today.  Concerns regarding medicines are outlined above.  No orders of the defined types were placed in this encounter.  Meds ordered this encounter  Medications   ranolazine (RANEXA) 500 MG 12 hr tablet    Sig: Take 1 tablet (500 mg total) by mouth 2 (two) times daily.    Dispense:  180 tablet    Refill:  3   rosuvastatin (CRESTOR) 10 MG tablet    Sig: Take 1 tablet (10 mg total) by mouth daily.    Dispense:  90 tablet    Refill:  3   bumetanide (BUMEX) 2 MG tablet    Sig: Take 1 tablet (2 mg total) by mouth 2 (two) times daily.    Dispense:  180 tablet    Refill:  3   potassium chloride SA (KLOR-CON M) 20 MEQ tablet    Sig: Take 1 tablet (20 mEq total) by mouth 2 (two) times daily.    Dispense:  180 tablet    Refill:  3    Patient Instructions  Medication Instructions:  Your physician recommends that you continue on your current medications as directed. Please refer to the Current Medication list given to you today.  *If you need a refill on your cardiac medications before your next appointment, please call your pharmacy*   Lab Work: None   Testing/Procedures: None   Follow-Up: At [facility], you and your health needs are our priority.  As part of our continuing mission to provide you with exceptional heart care, we have created designated Provider Care Teams.  These Care Teams include your primary Cardiologist (physician) and Advanced Practice Providers (APPs -  Physician Assistants and Nurse Practitioners) who all work together to provide you with the care you need, when you need it.  We recommend signing up for the patient portal called "MyChart".  Sign up information is provided on this After Visit Summary.  MyChart is used to connect with patients for Virtual Visits (Telemedicine).  Patients are able to view lab/test results, encounter notes, upcoming appointments, etc.  Non-urgent messages can be sent to your provider as well.   To learn more about what you can do with MyChart, go to [email or web address].    Your next appointment:   9 month(s)  The format for your next appointment:   In Person  Provider:   Thomasene Ripple, DO     Other Instructions   Important Information About Sugar         Adopting a Healthy Lifestyle.  Know what a healthy weight is for you (roughly BMI <  25) and aim to maintain this   Aim for 7+ servings of fruits and vegetables daily   65-80+ fluid ounces of water or unsweet tea for healthy kidneys   Limit to max 1 drink of alcohol per day; avoid smoking/tobacco   Limit animal fats in diet for cholesterol and heart health - choose grass fed whenever available   Avoid highly processed foods, and foods high in saturated/trans fats   Aim for low stress - take time to unwind and care for your mental health   Aim for 150 min of moderate intensity exercise weekly for heart health, and weights twice weekly for bone health   Aim for 7-9 hours of sleep daily   When it comes to diets, agreement about the perfect plan isnt easy to find, even among the experts. Experts at the [facility] [employer] developed an idea  known as the Healthy Eating Plate. Just imagine a plate divided into logical, healthy portions.   The emphasis is on diet quality:   Load up on vegetables and fruits - one-half of your plate: Aim for color and variety, and remember that potatoes dont count.   Go for whole grains - one-quarter of your plate: Whole wheat, barley, wheat berries, quinoa, oats, brown rice, and foods made with them. If you want pasta, go with whole wheat pasta.   Protein power - one-quarter of your plate: Fish, chicken, beans, and nuts are all healthy, versatile protein sources. Limit red meat.   The diet, however, does go beyond the plate, offering a few other suggestions.   Use healthy plant oils, such as olive, canola, soy, corn, sunflower and peanut. Check the labels, and avoid partially hydrogenated oil, which have unhealthy trans fats.   If youre thirsty, drink water. Coffee and tea are good in moderation, but skip sugary drinks and limit milk and dairy products to one or two daily servings.   The type of carbohydrate in the diet is more important than the amount. Some sources of carbohydrates, such as vegetables, fruits, whole grains, and beans-are healthier than others.   Finally, stay active  Signed, Thomasene Ripple, DO  12/18/2021 7:01 PM    [facility]

## 2021-12-16 NOTE — Patient Instructions (Signed)
Medication Instructions:  Your physician recommends that you continue on your current medications as directed. Please refer to the Current Medication list given to you today.  *If you need a refill on your cardiac medications before your next appointment, please call your pharmacy*   Lab Work: None   Testing/Procedures: None   Follow-Up: At [facility], you and your health needs are our priority.  As part of our continuing mission to provide you with exceptional heart care, we have created designated Provider Care Teams.  These Care Teams include your primary Cardiologist (physician) and Advanced Practice Providers (APPs -  Physician Assistants and Nurse Practitioners) who all work together to provide you with the care you need, when you need it.  We recommend signing up for the patient portal called "MyChart".  Sign up information is provided on this After Visit Summary.  MyChart is used to connect with patients for Virtual Visits (Telemedicine).  Patients are able to view lab/test results, encounter notes, upcoming appointments, etc.  Non-urgent messages can be sent to your provider as well.   To learn more about what you can do with MyChart, go to [email or web address].    Your next appointment:   9 month(s)  The format for your next appointment:   In Person  Provider:   Kardie Tobb, DO     Other Instructions   Important Information About Sugar       

## 2021-12-17 DIAGNOSIS — G4733 Obstructive sleep apnea (adult) (pediatric): Secondary | ICD-10-CM | POA: Diagnosis not present

## 2021-12-21 DIAGNOSIS — G4733 Obstructive sleep apnea (adult) (pediatric): Secondary | ICD-10-CM | POA: Diagnosis not present

## 2021-12-23 DIAGNOSIS — F332 Major depressive disorder, recurrent severe without psychotic features: Secondary | ICD-10-CM | POA: Diagnosis not present

## 2021-12-23 DIAGNOSIS — R69 Illness, unspecified: Secondary | ICD-10-CM | POA: Diagnosis not present

## 2021-12-26 ENCOUNTER — Ambulatory Visit: Payer: 59 | Admitting: Physician Assistant

## 2021-12-26 ENCOUNTER — Encounter: Payer: Self-pay | Admitting: Physician Assistant

## 2021-12-26 VITALS — BP 120/80 | HR 85 | Temp 97.6°F | Resp 15 | Ht 64.0 in | Wt 299.0 lb

## 2021-12-26 DIAGNOSIS — I10 Essential (primary) hypertension: Secondary | ICD-10-CM

## 2021-12-26 DIAGNOSIS — Z125 Encounter for screening for malignant neoplasm of prostate: Secondary | ICD-10-CM

## 2021-12-26 DIAGNOSIS — R7303 Prediabetes: Secondary | ICD-10-CM

## 2021-12-26 DIAGNOSIS — G459 Transient cerebral ischemic attack, unspecified: Secondary | ICD-10-CM | POA: Diagnosis not present

## 2021-12-26 DIAGNOSIS — E782 Mixed hyperlipidemia: Secondary | ICD-10-CM

## 2021-12-26 DIAGNOSIS — E559 Vitamin D deficiency, unspecified: Secondary | ICD-10-CM

## 2021-12-26 DIAGNOSIS — K219 Gastro-esophageal reflux disease without esophagitis: Secondary | ICD-10-CM | POA: Diagnosis not present

## 2021-12-26 DIAGNOSIS — Z23 Encounter for immunization: Secondary | ICD-10-CM | POA: Diagnosis not present

## 2021-12-26 NOTE — Progress Notes (Signed)
Established Patient Office Visit  Subjective:  Patient ID: Randall Rojas, male    DOB: 05/16/1960  Age: 61 y.o. MRN: [id number]  CC:  Chief Complaint  Patient presents with   Hyperlipidemia   Gastroesophageal Reflux    HPI Randall Rojas presents for follow up of chronic medical issues   Mixed hyperlipidemia  Pt presents with hyperlipidemia. Compliance with treatment has been fair; The patient maintains a low cholesterol diet , follows up as directed ,  The patient denies experiencing any hypercholesterolemia related symptoms.  Currently on crestor $RemoveBe'10mg'FefckaJME$  qd  Pt has history of edema - he takes bumex $RemoveBef'2mg'YylBDpdZYS$  prn swelling along with potassiumm 37meq- states symptoms are stable at this time  Pt continues to follow with neurology for history of headaches , history of TIA - he does follow up with them regularly and has appt coming up this week-- he states he continues to have headaches and at times a tremor - will be discussing about med changes at upcoming appt He is currently taking aimovig, elavil, keppra,  Pt also sees counselor for anxiety regarding these issues as well and taking buspar and zoloft  Pt with history of joint pain - pt currently using tylenol and elavil - symptoms stable  Pt with history of GERD - pt using omeprazole $RemoveBeforeDE'40mg'nkFMOjJLXZJvRtx$  as directed  Pt sees cardiology Dr Harriet Masson on a regular basis  -  Pt with history of CAD - currently on ASA $Remo'325mg'zwsgK$  qd and ranexa $RemoveBe'500mg'kGfFBwpcW$   Had recent follow up appt  Pt with history of Vit D def - taking weekly supplement - due for labwork  Would like to update flu shot and pneumonia shot Past Medical History:  Diagnosis Date   Acute laryngopharyngitis 11/11/2020   Acute URI 12/12/2019   Anxiety 06/17/2020   Arthralgia 06/17/2020   Asthma    Bilateral leg edema 06/23/2020   Chest pain 04/15/2015   Chronic diastolic heart failure ([place]) 08/05/2020   Chronic heart failure with preserved ejection fraction ([place]) 07/11/2020   Coronary artery calcification seen  on CT scan 04/15/2015   Coronary artery disease    Coronary artery disease involving native coronary artery of native heart without angina pectoris 07/11/2020   Coronary artery disease of native artery of native heart with stable angina pectoris ([place]) 06/23/2020   Essential hypertension 04/15/2015   Exertional dyspnea 05/26/2020   Frequent headaches 07/20/2020   GERD (gastroesophageal reflux disease) 05/27/2019   History of CVA in adulthood 08/05/2020   HLD (hyperlipidemia) 07/11/2020   Hyperglycemia 06/06/2015   Hyperlipidemia 04/15/2015   Hypertension 08/05/2020   Hypokalemia 07/20/2020   Left sided numbness 07/25/2020   Leukocytosis 06/06/2015   Malaise 06/17/2020   Mixed hyperlipidemia 04/15/2015   Morbid obesity ([place]) 04/15/2015   Need for prophylactic vaccination against Streptococcus pneumoniae (pneumococcus) 12/03/2019   Need for prophylactic vaccination and inoculation against influenza 12/03/2019   Need for tetanus booster 06/17/2020   Nonepileptic episode ([place]) 10/20/2020   Obesity, Class III, BMI 40-49.9 (morbid obesity) ([place]) 04/15/2015   Other sleep apnea 07/20/2020   Palpitations 08/05/2020   Pneumonia due to COVID-19 virus 04/06/2019   Prediabetes 06/23/2020   Prostate cancer screening 06/02/2019   Rectal bleeding 11/01/2020   Seizure-like activity ([place]) 05/26/2020   Shortness of breath 06/23/2020   TIA (transient ischemic attack)    Upper extremity weakness 05/26/2020   Weakness of left lower extremity 05/26/2020    Past Surgical History:  Procedure Laterality Date   CARDIAC CATHETERIZATION N/A 04/22/2015  Procedure: Left Heart Cath and Coronary Angiography;  Surgeon: Peter M Martinique, MD;  Location: [facility];  Service: Cardiovascular;  Laterality: N/A;   GALLBLADDER SURGERY     LEFT HEART CATH AND CORONARY ANGIOGRAPHY N/A 10/28/2020   Procedure: LEFT HEART CATH AND CORONARY ANGIOGRAPHY;  Surgeon: Troy Sine, MD;  Location: [facility];  Service: Cardiovascular;  Laterality: N/A;    SINUS SURGERY WITH INSTATRAK      Family History  Problem Relation Age of Onset   Hypertension Mother    Rheum arthritis Mother    Cancer Father    Heart attack Father    Cancer Brother    Hypertension Sister     Social History   Socioeconomic History   Marital status: Married    Spouse name: Not on file   Number of children: Not on file   Years of education: Not on file   Highest education level: Not on file  Occupational History   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  Tobacco Use   Smoking status: Former    Packs/day: 3.00    Types: Cigarettes    Quit date: 04/13/1989    Years since quitting: 32.7   Smokeless tobacco: Never  Substance and Sexual Activity   Alcohol use: Never   Drug use: Never   Sexual activity: Not Currently  Other Topics Concern   Not on file  Social History Narrative   Epworth Sleepiness Scale = 7 (as of 1/18/207)   Social Determinants of Health   Financial Resource Strain: High Risk (06/17/2021)   Overall Financial Resource Strain (CARDIA)    Difficulty of Paying Living Expenses: Hard  Food Insecurity: Not on file  Transportation Needs: No Transportation Needs (06/17/2021)   PRAPARE - Transportation    Lack of Transportation (Medical): No    Lack of Transportation (Non-Medical): No  Physical Activity: Not on file  Stress: Not on file  Social Connections: Not on file  Intimate Partner Violence: Not on file     Current Outpatient Medications:    acetaminophen (TYLENOL) 500 MG tablet, Take by mouth., Disp: , Rfl:    AIMOVIG 140 MG/ML SOAJ, SMARTSIG:140 Milligram(s) SUB-Q Once a Month, Disp: , Rfl:    albuterol (VENTOLIN HFA) 108 (90 Base) MCG/ACT inhaler, INHALE 2 PUFFS INTO THE LUNGS EVERY 6 HOURS AS NEEDED FOR WHEEZING OR SHORTNESS OF BREATH., Disp: 8.5 g, Rfl: 1   amitriptyline (ELAVIL) 50 MG tablet, Take 50 mg by mouth at bedtime., Disp: , Rfl:    aspirin 325 MG tablet, Take 325 mg by mouth daily., Disp: , Rfl:    baclofen (LIORESAL) 10 MG  tablet, Take 10-20 tablets by mouth every 6 (six) hours as needed (pain)., Disp: , Rfl:    bumetanide (BUMEX) 2 MG tablet, Take 1 tablet (2 mg total) by mouth 2 (two) times daily., Disp: 180 tablet, Rfl: 3   busPIRone (BUSPAR) 10 MG tablet, Take 10 mg by mouth 2 (two) times daily., Disp: , Rfl:    ketoconazole (NIZORAL) 2 % shampoo, Apply topically., Disp: , Rfl:    levETIRAcetam (KEPPRA) 750 MG tablet, Take 1,500 mg by mouth 2 (two) times daily., Disp: , Rfl:    nitroGLYCERIN (NITROSTAT) 0.4 MG SL tablet, Place 1 tablet (0.4 mg total) under the tongue every 5 (five) minutes as needed for chest pain. Up to three times., Disp: 45 tablet, Rfl: 3   omeprazole (PRILOSEC) 40 MG capsule, TAKE 1 CAPSULE BY MOUTH ONCE DAILY, Disp: 30 capsule,  Rfl: 3   potassium chloride SA (KLOR-CON M) 20 MEQ tablet, Take 1 tablet (20 mEq total) by mouth 2 (two) times daily., Disp: 180 tablet, Rfl: 3   promethazine (PHENERGAN) 25 MG tablet, Take 25 mg by mouth every 6 (six) hours as needed for nausea or vomiting., Disp: , Rfl:    ranolazine (RANEXA) 500 MG 12 hr tablet, Take 1 tablet (500 mg total) by mouth 2 (two) times daily., Disp: 180 tablet, Rfl: 3   rosuvastatin (CRESTOR) 10 MG tablet, Take 1 tablet (10 mg total) by mouth daily., Disp: 90 tablet, Rfl: 3   sertraline (ZOLOFT) 100 MG tablet, Take 200 mg by mouth daily., Disp: , Rfl:    vitamin B-12 (CYANOCOBALAMIN) 1000 MCG tablet, Take 1,000 mcg by mouth daily., Disp: , Rfl:    Vitamin D, Ergocalciferol, (DRISDOL) 1.25 MG (50000 UNIT) CAPS capsule, TAKE 1 CAPSULE BY MOUTH EVERY 7 DAYS, Disp: 12 capsule, Rfl: 1   Allergies  Allergen Reactions   Cephalexin Nausea And Vomiting and Rash   Ubrogepant Swelling   CONSTITUTIONAL: Negative for chills, fatigue, fever, unintentional weight gain and unintentional weight loss.  E/N/T: Negative for ear pain, nasal congestion and sore throat.  CARDIOVASCULAR: Negative for chest pain, dizziness, palpitations and pedal edema.   RESPIRATORY: Negative for recent cough and dyspnea.  GASTROINTESTINAL: Negative for abdominal pain, acid reflux symptoms, constipation, diarrhea, nausea and vomiting.  MSK: Negative for arthralgias and myalgias.  INTEGUMENTARY: Negative for rash.  NEUROLOGICAL: see HPI PSYCHIATRIC: Negative for sleep disturbance and to question depression screen.  Negative for depression, negative for anhedonia.       Objective:  PHYSICAL EXAM:   VS: BP 120/80   Pulse 85   Temp 97.6 F (36.4 C)   Resp 15   Ht $R'5\' 4"'Ai$  (1.626 m)   Wt 299 lb (135.6 kg)   SpO2 95%   BMI 51.32 kg/m   GEN: Well nourished, well developed, in no acute distress   Cardiac: RRR; no murmurs, rubs, or gallops,no edema -  Respiratory:  normal respiratory rate and pattern with no distress - normal breath sounds with no rales, rhonchi, wheezes or rubs GI: normal bowel sounds, no masses or tenderness MS: no deformity or atrophy  Skin: warm and dry, no rash  Neuro:  Alert and Oriented x 3, Strength and sensation are intact - CN II-Xii grossly intact Psych: euthymic mood, appropriate affect and demeanor   There are no preventive care reminders to display for this patient.  Lab Results  Component Value Date   TSH 2.350 08/23/2021   Lab Results  Component Value Date   WBC 9.0 08/23/2021   HGB 15.8 08/23/2021   HCT 47.7 08/23/2021   MCV 94 08/23/2021   PLT 170 08/23/2021   Lab Results  Component Value Date   NA 141 08/23/2021   K 4.7 08/23/2021   CO2 27 08/23/2021   GLUCOSE 94 08/23/2021   BUN 13 08/23/2021   CREATININE 1.23 08/23/2021   BILITOT 0.6 08/23/2021   ALKPHOS 109 08/23/2021   AST 25 08/23/2021   ALT 29 08/23/2021   PROT 6.8 08/23/2021   ALBUMIN 4.4 08/23/2021   CALCIUM 9.8 08/23/2021   ANIONGAP 11 04/07/2019   EGFR 67 08/23/2021   Lab Results  Component Value Date   CHOL 147 08/23/2021   Lab Results  Component Value Date   HDL 37 (L) 08/23/2021   Lab Results  Component Value Date    LDLCALC 84 08/23/2021   Lab  Results  Component Value Date   TRIG 147 08/23/2021   Lab Results  Component Value Date   CHOLHDL 4.0 08/23/2021   Lab Results  Component Value Date   HGBA1C 5.6 08/23/2021      Assessment & Plan:   Problem List Items Addressed This Visit                                    Mixed hyperlipidemia   Relevant Medications   isosorbide mononitrate (IMDUR) 15 mg TB24 24 hr tablet Continue crestor $RemoveBeforeDE'10mg'CRrluflJxEVLTyN$  qd   Other Relevant Orders   Lipid panel Continue meds Watch diet    Hx TIA Continue meds and follow up with neurology as directed Continue ASA  GERD Rx for omeprazole as directed   CAD Continue meds as directed Labwork pending Follow up with cardiology as directed  Anxiety Continue buspar and zoloft as directed  Vit D def Continue supplement Labwork pending    Need flu shot Flucelvax given  Need pneumonia shot Prevnar 20 given                      No orders of the defined types were placed in this encounter.    Follow-up: Return in about 4 months (around 04/28/2022) for chronic fasting follow up.    SARA R Melvyn Hommes, PA-C

## 2021-12-27 ENCOUNTER — Encounter: Payer: Self-pay | Admitting: Cardiology

## 2021-12-27 LAB — COMPREHENSIVE METABOLIC PANEL
ALT: 33 IU/L (ref 0–44)
AST: 25 IU/L (ref 0–40)
Albumin/Globulin Ratio: 1.7 (ref 1.2–2.2)
Albumin: 4.2 g/dL (ref 3.9–4.9)
Alkaline Phosphatase: 102 IU/L (ref 44–121)
BUN/Creatinine Ratio: 10 (ref 10–24)
BUN: 12 mg/dL (ref 8–27)
Bilirubin Total: 0.7 mg/dL (ref 0.0–1.2)
CO2: 27 mmol/L (ref 20–29)
Calcium: 8.9 mg/dL (ref 8.6–10.2)
Chloride: 99 mmol/L (ref 96–106)
Creatinine, Ser: 1.18 mg/dL (ref 0.76–1.27)
Globulin, Total: 2.5 g/dL (ref 1.5–4.5)
Glucose: 97 mg/dL (ref 70–99)
Potassium: 4.4 mmol/L (ref 3.5–5.2)
Sodium: 140 mmol/L (ref 134–144)
Total Protein: 6.7 g/dL (ref 6.0–8.5)
eGFR: 70 mL/min/{1.73_m2} (ref >59)

## 2021-12-27 LAB — CBC WITH DIFFERENTIAL/PLATELET
Basophils Absolute: 0.1 10*3/uL (ref 0.0–0.2)
Basos: 1 %
EOS (ABSOLUTE): 0.4 10*3/uL (ref 0.0–0.4)
Eos: 5 %
Hematocrit: 45.8 % (ref 37.5–51.0)
Hemoglobin: 15.3 g/dL (ref 13.0–17.7)
Immature Grans (Abs): 0 10*3/uL (ref 0.0–0.1)
Immature Granulocytes: 0 %
Lymphocytes Absolute: 2.9 10*3/uL (ref 0.7–3.1)
Lymphs: 39 %
MCH: 30.4 pg (ref 26.6–33.0)
MCHC: 33.4 g/dL (ref 31.5–35.7)
MCV: 91 fL (ref 79–97)
Monocytes Absolute: 0.5 10*3/uL (ref 0.1–0.9)
Monocytes: 6 %
Neutrophils Absolute: 3.8 10*3/uL (ref 1.4–7.0)
Neutrophils: 49 %
Platelets: 158 10*3/uL (ref 150–450)
RBC: 5.03 x10E6/uL (ref 4.14–5.80)
RDW: 13.5 % (ref 11.6–15.4)
WBC: 7.6 10*3/uL (ref 3.4–10.8)

## 2021-12-27 LAB — VITAMIN D 25 HYDROXY (VIT D DEFICIENCY, FRACTURES): Vit D, 25-Hydroxy: 46.4 ng/mL (ref 30.0–100.0)

## 2021-12-27 LAB — CARDIOVASCULAR RISK ASSESSMENT

## 2021-12-27 LAB — LIPID PANEL
Chol/HDL Ratio: 3.8 ratio (ref 0.0–5.0)
Cholesterol, Total: 132 mg/dL (ref 100–199)
HDL: 35 mg/dL — ABNORMAL LOW (ref >39)
LDL Chol Calc (NIH): 73 mg/dL (ref 0–99)
Triglycerides: 134 mg/dL (ref 0–149)
VLDL Cholesterol Cal: 24 mg/dL (ref 5–40)

## 2021-12-27 LAB — HEMOGLOBIN A1C
Est. average glucose Bld gHb Est-mCnc: 126 mg/dL
Hgb A1c MFr Bld: 6 % — ABNORMAL HIGH (ref 4.8–5.6)

## 2021-12-27 LAB — TSH: TSH: 2.33 u[IU]/mL (ref 0.450–4.500)

## 2021-12-27 LAB — PSA: Prostate Specific Ag, Serum: 7.3 ng/mL — ABNORMAL HIGH (ref 0.0–4.0)

## 2021-12-29 DIAGNOSIS — F332 Major depressive disorder, recurrent severe without psychotic features: Secondary | ICD-10-CM | POA: Diagnosis not present

## 2021-12-29 DIAGNOSIS — R69 Illness, unspecified: Secondary | ICD-10-CM | POA: Diagnosis not present

## 2022-01-03 ENCOUNTER — Other Ambulatory Visit: Payer: Self-pay | Admitting: Physician Assistant

## 2022-01-03 ENCOUNTER — Encounter: Payer: Self-pay | Admitting: Physician Assistant

## 2022-01-03 DIAGNOSIS — R972 Elevated prostate specific antigen [PSA]: Secondary | ICD-10-CM

## 2022-01-05 ENCOUNTER — Telehealth: Payer: Self-pay | Admitting: Cardiology

## 2022-01-05 ENCOUNTER — Encounter: Payer: Self-pay | Admitting: Physician Assistant

## 2022-01-05 NOTE — Telephone Encounter (Signed)
Routed to [facility] records pool to process request

## 2022-01-05 NOTE — Telephone Encounter (Signed)
Wife called stating the patient's insurance company is requesting the patient's last visit notes be sent to Attn: [place], [place], ph# [phone], ext. [phone].  Incident# [id number].  Fax# [phone]. Wife stated they will also need to know patient is not scheduled for an appointment until June 2024.

## 2022-01-05 NOTE — Telephone Encounter (Signed)
Pt needs to be seen for appt

## 2022-01-06 ENCOUNTER — Encounter: Payer: Self-pay | Admitting: Physician Assistant

## 2022-01-06 ENCOUNTER — Ambulatory Visit (INDEPENDENT_AMBULATORY_CARE_PROVIDER_SITE_OTHER): Payer: 59 | Admitting: Physician Assistant

## 2022-01-06 VITALS — BP 132/82 | HR 95 | Temp 97.6°F | Ht 63.0 in | Wt 301.2 lb

## 2022-01-06 DIAGNOSIS — J060 Acute laryngopharyngitis: Secondary | ICD-10-CM | POA: Diagnosis not present

## 2022-01-06 DIAGNOSIS — J069 Acute upper respiratory infection, unspecified: Secondary | ICD-10-CM | POA: Diagnosis not present

## 2022-01-06 LAB — POC COVID19 BINAXNOW: SARS Coronavirus 2 Ag: NEGATIVE

## 2022-01-06 MED ORDER — PREDNISONE 20 MG PO TABS: ORAL_TABLET | ORAL | 0 refills | Status: AC

## 2022-01-06 MED ORDER — HYDROCODONE BIT-HOMATROP MBR 5-1.5 MG/5ML PO SOLN: 5.0000 mL | Freq: Four times a day (QID) | ORAL | 0 refills | Status: DC | PRN

## 2022-01-06 MED ORDER — AZITHROMYCIN 250 MG PO TABS: ORAL_TABLET | ORAL | 0 refills | Status: AC

## 2022-01-06 NOTE — Progress Notes (Signed)
Acute Office Visit  Subjective:    Patient ID: Randall Rojas, male    DOB: 06-May-1960, 61 y.o.   MRN: [id number]  Chief Complaint  Patient presents with   Cough   Nasal Congestion    HPI: Patient is in today for complaints of cough, congestion and pnd for the past several days - states that he has had some mild malaise.  Denies chest pain or shortness of breath Denies fever  Past Medical History:  Diagnosis Date   Acute laryngopharyngitis 11/11/2020   Acute URI 12/12/2019   Anxiety 06/17/2020   Arthralgia 06/17/2020   Asthma    Bilateral leg edema 06/23/2020   Chest pain 04/15/2015   Chronic diastolic heart failure ([place]) 08/05/2020   Chronic heart failure with preserved ejection fraction ([place]) 07/11/2020   Coronary artery calcification seen on CT scan 04/15/2015   Coronary artery disease    Coronary artery disease involving native coronary artery of native heart without angina pectoris 07/11/2020   Coronary artery disease of native artery of native heart with stable angina pectoris ([place]) 06/23/2020   Essential hypertension 04/15/2015   Exertional dyspnea 05/26/2020   Frequent headaches 07/20/2020   GERD (gastroesophageal reflux disease) 05/27/2019   History of CVA in adulthood 08/05/2020   HLD (hyperlipidemia) 07/11/2020   Hyperglycemia 06/06/2015   Hyperlipidemia 04/15/2015   Hypertension 08/05/2020   Hypokalemia 07/20/2020   Left sided numbness 07/25/2020   Leukocytosis 06/06/2015   Malaise 06/17/2020   Mixed hyperlipidemia 04/15/2015   Morbid obesity ([place]) 04/15/2015   Need for prophylactic vaccination against Streptococcus pneumoniae (pneumococcus) 12/03/2019   Need for prophylactic vaccination and inoculation against influenza 12/03/2019   Need for tetanus booster 06/17/2020   Nonepileptic episode ([place]) 10/20/2020   Obesity, Class III, BMI 40-49.9 (morbid obesity) ([place]) 04/15/2015   Other sleep apnea 07/20/2020   Palpitations 08/05/2020   Pneumonia due to COVID-19 virus 04/06/2019   Prediabetes  06/23/2020   Prostate cancer screening 06/02/2019   Rectal bleeding 11/01/2020   Seizure-like activity ([place]) 05/26/2020   Shortness of breath 06/23/2020   TIA (transient ischemic attack)    Upper extremity weakness 05/26/2020   Weakness of left lower extremity 05/26/2020    Past Surgical History:  Procedure Laterality Date   CARDIAC CATHETERIZATION N/A 04/22/2015   Procedure: Left Heart Cath and Coronary Angiography;  Surgeon: Peter M Martinique, MD;  Location: [facility];  Service: Cardiovascular;  Laterality: N/A;   GALLBLADDER SURGERY     LEFT HEART CATH AND CORONARY ANGIOGRAPHY N/A 10/28/2020   Procedure: LEFT HEART CATH AND CORONARY ANGIOGRAPHY;  Surgeon: Troy Sine, MD;  Location: [facility];  Service: Cardiovascular;  Laterality: N/A;   SINUS SURGERY WITH INSTATRAK      Family History  Problem Relation Age of Onset   Hypertension Mother    Rheum arthritis Mother    Cancer Father    Heart attack Father    Cancer Brother    Hypertension Sister     Social History   Socioeconomic History   Marital status: Married    Spouse name: Not on file   Number of children: Not on file   Years of education: Not on file   Highest education level: Not on file  Occupational History   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  Tobacco Use   Smoking status: Former    Packs/day: 3.00    Types: Cigarettes    Quit date: 04/13/1989    Years since quitting: 21.7  Smokeless tobacco: Never  Substance and Sexual Activity   Alcohol use: Never   Drug use: Never   Sexual activity: Not Currently  Other Topics Concern   Not on file  Social History Narrative   Epworth Sleepiness Scale = 7 (as of 1/18/207)   Social Determinants of Health   Financial Resource Strain: High Risk (06/17/2021)   Overall Financial Resource Strain (CARDIA)    Difficulty of Paying Living Expenses: Hard  Food Insecurity: Not on file  Transportation Needs: No Transportation Needs (06/17/2021)   PRAPARE - Transportation     Lack of Transportation (Medical): No    Lack of Transportation (Non-Medical): No  Physical Activity: Not on file  Stress: Not on file  Social Connections: Not on file  Intimate Partner Violence: Not on file    Outpatient Medications Prior to Visit  Medication Sig Dispense Refill   acetaminophen (TYLENOL) 500 MG tablet Take by mouth.     AIMOVIG 140 MG/ML SOAJ SMARTSIG:140 Milligram(s) SUB-Q Once a Month     albuterol (VENTOLIN HFA) 108 (90 Base) MCG/ACT inhaler INHALE 2 PUFFS INTO THE LUNGS EVERY 6 HOURS AS NEEDED FOR WHEEZING OR SHORTNESS OF BREATH. 8.5 g 1   amitriptyline (ELAVIL) 50 MG tablet Take 50 mg by mouth at bedtime.     aspirin 325 MG tablet Take 325 mg by mouth daily.     baclofen (LIORESAL) 10 MG tablet Take 10-20 tablets by mouth every 6 (six) hours as needed (pain).     bumetanide (BUMEX) 2 MG tablet Take 1 tablet (2 mg total) by mouth 2 (two) times daily. 180 tablet 3   busPIRone (BUSPAR) 10 MG tablet Take 10 mg by mouth 2 (two) times daily.     ketoconazole (NIZORAL) 2 % shampoo Apply topically.     levETIRAcetam (KEPPRA) 750 MG tablet Take 1,500 mg by mouth 2 (two) times daily.     nitroGLYCERIN (NITROSTAT) 0.4 MG SL tablet Place 1 tablet (0.4 mg total) under the tongue every 5 (five) minutes as needed for chest pain. Up to three times. 45 tablet 3   omeprazole (PRILOSEC) 40 MG capsule TAKE 1 CAPSULE BY MOUTH ONCE DAILY 30 capsule 3   potassium chloride SA (KLOR-CON M) 20 MEQ tablet Take 1 tablet (20 mEq total) by mouth 2 (two) times daily. 180 tablet 3   promethazine (PHENERGAN) 25 MG tablet Take 25 mg by mouth every 6 (six) hours as needed for nausea or vomiting.     ranolazine (RANEXA) 500 MG 12 hr tablet Take 1 tablet (500 mg total) by mouth 2 (two) times daily. 180 tablet 3   rosuvastatin (CRESTOR) 10 MG tablet Take 1 tablet (10 mg total) by mouth daily. 90 tablet 3   sertraline (ZOLOFT) 100 MG tablet Take 200 mg by mouth daily.     vitamin B-12 (CYANOCOBALAMIN)  1000 MCG tablet Take 1,000 mcg by mouth daily.     Vitamin D, Ergocalciferol, (DRISDOL) 1.25 MG (50000 UNIT) CAPS capsule TAKE 1 CAPSULE BY MOUTH EVERY 7 DAYS 12 capsule 1   No facility-administered medications prior to visit.    Allergies  Allergen Reactions   Cephalexin Nausea And Vomiting and Rash   Ubrogepant Swelling    Review of Systems CONSTITUTIONAL: see HPI E/N/T: see HPI CARDIOVASCULAR: Negative for chest pain, dizziness, palpitations and pedal edema.  RESPIRATORY: see HPI GASTROINTESTINAL: Negative for abdominal pain, acid reflux symptoms, constipation, diarrhea, nausea and vomiting.       Objective:  PHYSICAL EXAM:  VS: BP 132/82 (BP Location: Left Arm, Patient Position: Sitting, Cuff Size: Large)   Pulse 95   Temp 97.6 F (36.4 C) (Temporal)   Ht '5\' 3"'  (1.6 m)   Wt (!) 301 lb 3.2 oz (136.6 kg)   SpO2 92%   BMI 53.36 kg/m   GEN: Well nourished, well developed, in no acute distress  HEENT: normal external ears and nose - normal external auditory canals and TMS -m - Lips, Teeth and Gums - normal  Oropharynx - erythema/pnd  Cardiac: RRR; no murmurs,  Respiratory:  normal respiratory rate and pattern with no distress - normal breath sounds with no rales, rhonchi, wheezes or rubs  Office Visit on 01/06/2022  Component Date Value Ref Range Status   SARS Coronavirus 2 Ag 01/06/2022 Negative  Negative Final     Health Maintenance Due  Topic Date Due   Zoster Vaccines- Shingrix (1 of 2) Never done    There are no preventive care reminders to display for this patient.   Lab Results  Component Value Date   TSH 2.330 12/26/2021   Lab Results  Component Value Date   WBC 7.6 12/26/2021   HGB 15.3 12/26/2021   HCT 45.8 12/26/2021   MCV 91 12/26/2021   PLT 158 12/26/2021   Lab Results  Component Value Date   NA 140 12/26/2021   K 4.4 12/26/2021   CO2 27 12/26/2021   GLUCOSE 97 12/26/2021   BUN 12 12/26/2021   CREATININE 1.18 12/26/2021   BILITOT  0.7 12/26/2021   ALKPHOS 102 12/26/2021   AST 25 12/26/2021   ALT 33 12/26/2021   PROT 6.7 12/26/2021   ALBUMIN 4.2 12/26/2021   CALCIUM 8.9 12/26/2021   ANIONGAP 11 04/07/2019   EGFR 70 12/26/2021   Lab Results  Component Value Date   CHOL 132 12/26/2021   Lab Results  Component Value Date   HDL 35 (L) 12/26/2021   Lab Results  Component Value Date   LDLCALC 73 12/26/2021   Lab Results  Component Value Date   TRIG 134 12/26/2021   Lab Results  Component Value Date   CHOLHDL 3.8 12/26/2021   Lab Results  Component Value Date   HGBA1C 6.0 (H) 12/26/2021       Assessment & Plan:   Problem List Items Addressed This Visit       Respiratory   Acute laryngopharyngitis   Relevant Medications   azithromycin (ZITHROMAX) 250 MG tablet   predniSONE (DELTASONE) 20 MG tablet   HYDROcodone bit-homatropine (HYDROMET) 5-1.5 MG/5ML syrup   Other Visit Diagnoses     Acute upper respiratory infection    -  Primary   Relevant Medications   azithromycin (ZITHROMAX) 250 MG tablet   predniSONE (DELTASONE) 20 MG tablet   HYDROcodone bit-homatropine (HYDROMET) 5-1.5 MG/5ML syrup   Other Relevant Orders   POC COVID-19 BinaxNow (Completed)      Meds ordered this encounter  Medications   azithromycin (ZITHROMAX) 250 MG tablet    Sig: Take 2 tablets on day 1, then 1 tablet daily on days 2 through 5    Dispense:  6 tablet    Refill:  0    Order Specific Question:   Supervising Provider    Answer:   Rochel Brome [[id number]]   predniSONE (DELTASONE) 20 MG tablet    Sig: Take 3 tablets (60 mg total) by mouth daily with breakfast for 3 days, THEN 2 tablets (40 mg total) daily with breakfast for 3 days, THEN  1 tablet (20 mg total) daily with breakfast for 3 days.    Dispense:  18 tablet    Refill:  0    Order Specific Question:   Supervising Provider    Answer:   Shelton Silvas   HYDROcodone bit-homatropine (HYDROMET) 5-1.5 MG/5ML syrup    Sig: Take 5 mLs by mouth every 6  (six) hours as needed for cough.    Dispense:  120 mL    Refill:  0    Order Specific Question:   Supervising Provider    AnswerShelton Silvas    Orders Placed This Encounter  Procedures   POC COVID-19 BinaxNow     Follow-up: Return if symptoms worsen or fail to improve.  An After Visit Summary was printed and given to the patient.  Yetta Flock [facility] [phone]

## 2022-01-08 DIAGNOSIS — R200 Anesthesia of skin: Secondary | ICD-10-CM | POA: Diagnosis not present

## 2022-01-08 DIAGNOSIS — Z7409 Other reduced mobility: Secondary | ICD-10-CM | POA: Diagnosis not present

## 2022-01-08 DIAGNOSIS — G4733 Obstructive sleep apnea (adult) (pediatric): Secondary | ICD-10-CM | POA: Diagnosis not present

## 2022-01-16 DIAGNOSIS — G4733 Obstructive sleep apnea (adult) (pediatric): Secondary | ICD-10-CM | POA: Diagnosis not present

## 2022-01-20 ENCOUNTER — Other Ambulatory Visit: Payer: Self-pay | Admitting: Physician Assistant

## 2022-01-20 ENCOUNTER — Encounter: Payer: Self-pay | Admitting: Physician Assistant

## 2022-01-20 DIAGNOSIS — G4733 Obstructive sleep apnea (adult) (pediatric): Secondary | ICD-10-CM | POA: Diagnosis not present

## 2022-01-20 MED ORDER — BENZONATATE 100 MG PO CAPS: 100.0000 mg | ORAL_CAPSULE | Freq: Two times a day (BID) | ORAL | 0 refills | Status: DC | PRN

## 2022-01-20 NOTE — Telephone Encounter (Signed)
Will send in tessalon perles

## 2022-01-21 ENCOUNTER — Other Ambulatory Visit: Payer: Self-pay | Admitting: Physician Assistant

## 2022-01-23 ENCOUNTER — Other Ambulatory Visit: Payer: Self-pay | Admitting: Physician Assistant

## 2022-01-23 DIAGNOSIS — K219 Gastro-esophageal reflux disease without esophagitis: Secondary | ICD-10-CM

## 2022-02-07 ENCOUNTER — Encounter: Payer: Self-pay | Admitting: Physician Assistant

## 2022-02-07 DIAGNOSIS — R69 Illness, unspecified: Secondary | ICD-10-CM | POA: Diagnosis not present

## 2022-02-07 DIAGNOSIS — F332 Major depressive disorder, recurrent severe without psychotic features: Secondary | ICD-10-CM | POA: Diagnosis not present

## 2022-02-08 DIAGNOSIS — Z7409 Other reduced mobility: Secondary | ICD-10-CM | POA: Diagnosis not present

## 2022-02-08 DIAGNOSIS — G4733 Obstructive sleep apnea (adult) (pediatric): Secondary | ICD-10-CM | POA: Diagnosis not present

## 2022-02-08 DIAGNOSIS — R200 Anesthesia of skin: Secondary | ICD-10-CM | POA: Diagnosis not present

## 2022-02-09 ENCOUNTER — Ambulatory Visit: Payer: 59 | Admitting: Physician Assistant

## 2022-02-09 ENCOUNTER — Encounter: Payer: Self-pay | Admitting: Physician Assistant

## 2022-02-09 VITALS — BP 134/88 | HR 100 | Temp 97.4°F | Ht 63.0 in | Wt 305.0 lb

## 2022-02-09 DIAGNOSIS — R110 Nausea: Secondary | ICD-10-CM | POA: Diagnosis not present

## 2022-02-09 DIAGNOSIS — R7303 Prediabetes: Secondary | ICD-10-CM

## 2022-02-09 MED ORDER — ONDANSETRON HCL 4 MG PO TABS: 4.0000 mg | ORAL_TABLET | Freq: Three times a day (TID) | ORAL | 0 refills | Status: DC | PRN

## 2022-02-09 MED ORDER — BLOOD GLUCOSE MONITOR KIT: PACK | 0 refills | Status: AC

## 2022-02-09 NOTE — Progress Notes (Signed)
Acute Office Visit  Subjective:    Patient ID: Randall Rojas, male    DOB: 03-14-1961, 61 y.o.   MRN: [id number]  Chief Complaint  Patient presents with   GI Problem    HPI: Patient is in today for complaints of nausea.  He states that 2-3 weeks ago he had nausea with eating almost every meal.  He did have some malaise and headache as well.  Those symptoms have resolved and now he has occasional nausea - overall doing better He has been taking phenergan but that makes him too sleepy  Pt requests glucometer - has a history of prediabetes and would like to start checking glucose  Past Medical History:  Diagnosis Date   Acute laryngopharyngitis 11/11/2020   Acute URI 12/12/2019   Anxiety 06/17/2020   Arthralgia 06/17/2020   Asthma    Bilateral leg edema 06/23/2020   Chest pain 04/15/2015   Chronic diastolic heart failure ([place]) 08/05/2020   Chronic heart failure with preserved ejection fraction ([place]) 07/11/2020   Coronary artery calcification seen on CT scan 04/15/2015   Coronary artery disease    Coronary artery disease involving native coronary artery of native heart without angina pectoris 07/11/2020   Coronary artery disease of native artery of native heart with stable angina pectoris ([place]) 06/23/2020   Essential hypertension 04/15/2015   Exertional dyspnea 05/26/2020   Frequent headaches 07/20/2020   GERD (gastroesophageal reflux disease) 05/27/2019   History of CVA in adulthood 08/05/2020   HLD (hyperlipidemia) 07/11/2020   Hyperglycemia 06/06/2015   Hyperlipidemia 04/15/2015   Hypertension 08/05/2020   Hypokalemia 07/20/2020   Left sided numbness 07/25/2020   Leukocytosis 06/06/2015   Malaise 06/17/2020   Mixed hyperlipidemia 04/15/2015   Morbid obesity ([place]) 04/15/2015   Need for prophylactic vaccination against Streptococcus pneumoniae (pneumococcus) 12/03/2019   Need for prophylactic vaccination and inoculation against influenza 12/03/2019   Need for tetanus booster 06/17/2020   Nonepileptic  episode ([place]) 10/20/2020   Obesity, Class III, BMI 40-49.9 (morbid obesity) ([place]) 04/15/2015   Other sleep apnea 07/20/2020   Palpitations 08/05/2020   Pneumonia due to COVID-19 virus 04/06/2019   Prediabetes 06/23/2020   Prostate cancer screening 06/02/2019   Rectal bleeding 11/01/2020   Seizure-like activity ([place]) 05/26/2020   Shortness of breath 06/23/2020   TIA (transient ischemic attack)    Upper extremity weakness 05/26/2020   Weakness of left lower extremity 05/26/2020    Past Surgical History:  Procedure Laterality Date   CARDIAC CATHETERIZATION N/A 04/22/2015   Procedure: Left Heart Cath and Coronary Angiography;  Surgeon: Peter M Martinique, MD;  Location: [facility];  Service: Cardiovascular;  Laterality: N/A;   GALLBLADDER SURGERY     LEFT HEART CATH AND CORONARY ANGIOGRAPHY N/A 10/28/2020   Procedure: LEFT HEART CATH AND CORONARY ANGIOGRAPHY;  Surgeon: Troy Sine, MD;  Location: [facility];  Service: Cardiovascular;  Laterality: N/A;   SINUS SURGERY WITH INSTATRAK      Family History  Problem Relation Age of Onset   Hypertension Mother    Rheum arthritis Mother    Cancer Father    Heart attack Father    Cancer Brother    Hypertension Sister     Social History   Socioeconomic History   Marital status: Married    Spouse name: Not on file   Number of children: Not on file   Years of education: Not on file   Highest education level: Not on file  Occupational History  Occupation: [employer] engineer  Tobacco Use   Smoking status: Former    Packs/day: 3.00    Types: Cigarettes    Quit date: 04/13/1989    Years since quitting: 32.8   Smokeless tobacco: Never  Substance and Sexual Activity   Alcohol use: Never   Drug use: Never   Sexual activity: Not Currently  Other Topics Concern   Not on file  Social History Narrative   Epworth Sleepiness Scale = 7 (as of 1/18/207)   Social Determinants of Health   Financial Resource Strain: High Risk (06/17/2021)    Overall Financial Resource Strain (CARDIA)    Difficulty of Paying Living Expenses: Hard  Food Insecurity: Not on file  Transportation Needs: No Transportation Needs (06/17/2021)   PRAPARE - Hydrologist (Medical): No    Lack of Transportation (Non-Medical): No  Physical Activity: Not on file  Stress: Not on file  Social Connections: Not on file  Intimate Partner Violence: Not on file    Outpatient Medications Prior to Visit  Medication Sig Dispense Refill   acetaminophen (TYLENOL) 500 MG tablet Take by mouth.     AIMOVIG 140 MG/ML SOAJ SMARTSIG:140 Milligram(s) SUB-Q Once a Month     albuterol (VENTOLIN HFA) 108 (90 Base) MCG/ACT inhaler INHALE 2 PUFFS INTO THE LUNGS EVERY 6 HOURS AS NEEDED FOR WHEEZING OR SHORTNESS OF BREATH. 8.5 g 1   amitriptyline (ELAVIL) 50 MG tablet Take 50 mg by mouth at bedtime.     aspirin 325 MG tablet Take 325 mg by mouth daily.     baclofen (LIORESAL) 10 MG tablet Take 10-20 tablets by mouth every 6 (six) hours as needed (pain).     benzonatate (TESSALON) 100 MG capsule Take 1 capsule (100 mg total) by mouth 2 (two) times daily as needed for cough. 30 capsule 0   bumetanide (BUMEX) 2 MG tablet Take 1 tablet (2 mg total) by mouth 2 (two) times daily. 180 tablet 3   busPIRone (BUSPAR) 10 MG tablet Take 10 mg by mouth 2 (two) times daily.     ketoconazole (NIZORAL) 2 % shampoo Apply topically.     levETIRAcetam (KEPPRA) 750 MG tablet Take 1,500 mg by mouth 2 (two) times daily.     nitroGLYCERIN (NITROSTAT) 0.4 MG SL tablet Place 1 tablet (0.4 mg total) under the tongue every 5 (five) minutes as needed for chest pain. Up to three times. 45 tablet 3   omeprazole (PRILOSEC) 40 MG capsule TAKE 1 CAPSULE BY MOUTH ONCE DAILY 30 capsule 3   potassium chloride SA (KLOR-CON M) 20 MEQ tablet Take 1 tablet (20 mEq total) by mouth 2 (two) times daily. 180 tablet 3   ranolazine (RANEXA) 500 MG 12 hr tablet Take 1 tablet (500 mg total) by mouth 2  (two) times daily. 180 tablet 3   rosuvastatin (CRESTOR) 10 MG tablet Take 1 tablet (10 mg total) by mouth daily. 90 tablet 3   sertraline (ZOLOFT) 100 MG tablet Take 200 mg by mouth daily.     vitamin B-12 (CYANOCOBALAMIN) 1000 MCG tablet Take 1,000 mcg by mouth daily.     Vitamin D, Ergocalciferol, (DRISDOL) 1.25 MG (50000 UNIT) CAPS capsule TAKE 1 CAPSULE BY MOUTH EVERY 7 DAYS 12 capsule 3   promethazine (PHENERGAN) 25 MG tablet Take 25 mg by mouth every 6 (six) hours as needed for nausea or vomiting.     HYDROcodone bit-homatropine (HYDROMET) 5-1.5 MG/5ML syrup Take 5 mLs by mouth every 6 (  six) hours as needed for cough. 120 mL 0   No facility-administered medications prior to visit.    Allergies  Allergen Reactions   Cephalexin Nausea And Vomiting and Rash   Ubrogepant Swelling    Review of Systems CONSTITUTIONAL: Negative for chills, fatigue, fever,  E/N/T: Negative for ear pain, nasal congestion and sore throat.  CARDIOVASCULAR: Negative for chest pain, dizziness,  RESPIRATORY: Negative for recent cough and dyspnea.  GASTROINTESTINAL: see HPI         Objective:  PHYSICAL EXAM:   VS: BP 134/88 (BP Location: Left Arm, Patient Position: Sitting, Cuff Size: Large)   Pulse 100   Temp (!) 97.4 F (36.3 C) (Temporal)   Ht _0  (1.6 m)   Wt (!) 305 lb (138.3 kg)   SpO2 92%   BMI 54.03 kg/m   GEN: Well nourished, well developed, in no acute distress  Cardiac: RRR; no murmurs Respiratory:  normal respiratory rate and pattern with no distress - normal breath sounds with no rales, rhonchi, wheezes or rubs GI: normal bowel sounds, no masses or tenderness   Health Maintenance Due  Topic Date Due   Zoster Vaccines- Shingrix (1 of 2) Never done    There are no preventive care reminders to display for this patient.   Lab Results  Component Value Date   TSH 2.330 12/26/2021   Lab Results  Component Value Date   WBC 7.6 12/26/2021   HGB 15.3 12/26/2021   HCT 45.8  12/26/2021   MCV 91 12/26/2021   PLT 158 12/26/2021   Lab Results  Component Value Date   NA 140 12/26/2021   K 4.4 12/26/2021   CO2 27 12/26/2021   GLUCOSE 97 12/26/2021   BUN 12 12/26/2021   CREATININE 1.18 12/26/2021   BILITOT 0.7 12/26/2021   ALKPHOS 102 12/26/2021   AST 25 12/26/2021   ALT 33 12/26/2021   PROT 6.7 12/26/2021   ALBUMIN 4.2 12/26/2021   CALCIUM 8.9 12/26/2021   ANIONGAP 11 04/07/2019   EGFR 70 12/26/2021   Lab Results  Component Value Date   CHOL 132 12/26/2021   Lab Results  Component Value Date   HDL 35 (L) 12/26/2021   Lab Results  Component Value Date   LDLCALC 73 12/26/2021   Lab Results  Component Value Date   TRIG 134 12/26/2021   Lab Results  Component Value Date   CHOLHDL 3.8 12/26/2021   Lab Results  Component Value Date   HGBA1C 6.0 (H) 12/26/2021       Assessment & Plan:   Problem List Items Addressed This Visit       Other   Prediabetes   Relevant Medications   blood glucose meter kit and supplies KIT   Other Visit Diagnoses     Nausea    -  Primary   Relevant Medications   ondansetron (ZOFRAN) 4 MG tablet - stop phenergan   Other Relevant Orders   CBC with Differential/Platelet   Comprehensive metabolic panel   Amylase   Lipase      Meds ordered this encounter  Medications   blood glucose meter kit and supplies KIT    Sig: Dispense based on patient and insurance preference. Use up to four times daily as directed.    Dispense:  1 each    Refill:  0    Order Specific Question:   Supervising Provider    AnswerShelton Silvas    Order Specific Question:  Number of strips    Answer:   100    Order Specific Question:   Number of lancets    Answer:   100   ondansetron (ZOFRAN) 4 MG tablet    Sig: Take 1 tablet (4 mg total) by mouth every 8 (eight) hours as needed for nausea or vomiting.    Dispense:  20 tablet    Refill:  0    Order Specific Question:   Supervising Provider    AnswerShelton Silvas    Orders Placed This Encounter  Procedures   CBC with Differential/Platelet   Comprehensive metabolic panel   Amylase   Lipase     Follow-up: Return if symptoms worsen or fail to improve.  An After Visit Summary was printed and given to the patient.  Yetta Flock [facility] [phone]

## 2022-02-10 LAB — COMPREHENSIVE METABOLIC PANEL
ALT: 31 IU/L (ref 0–44)
AST: 26 IU/L (ref 0–40)
Albumin/Globulin Ratio: 1.7 (ref 1.2–2.2)
Albumin: 4.3 g/dL (ref 3.9–4.9)
Alkaline Phosphatase: 99 IU/L (ref 44–121)
BUN/Creatinine Ratio: 10 (ref 10–24)
BUN: 10 mg/dL (ref 8–27)
Bilirubin Total: 0.4 mg/dL (ref 0.0–1.2)
CO2: 29 mmol/L (ref 20–29)
Calcium: 9.4 mg/dL (ref 8.6–10.2)
Chloride: 101 mmol/L (ref 96–106)
Creatinine, Ser: 0.98 mg/dL (ref 0.76–1.27)
Globulin, Total: 2.5 g/dL (ref 1.5–4.5)
Glucose: 94 mg/dL (ref 70–99)
Potassium: 4 mmol/L (ref 3.5–5.2)
Sodium: 141 mmol/L (ref 134–144)
Total Protein: 6.8 g/dL (ref 6.0–8.5)
eGFR: 88 mL/min/{1.73_m2} (ref >59)

## 2022-02-10 LAB — CBC WITH DIFFERENTIAL/PLATELET
Basophils Absolute: 0.1 10*3/uL (ref 0.0–0.2)
Basos: 1 %
EOS (ABSOLUTE): 0.2 10*3/uL (ref 0.0–0.4)
Eos: 3 %
Hematocrit: 48.1 % (ref 37.5–51.0)
Hemoglobin: 15.8 g/dL (ref 13.0–17.7)
Immature Grans (Abs): 0.1 10*3/uL (ref 0.0–0.1)
Immature Granulocytes: 1 %
Lymphocytes Absolute: 3.2 10*3/uL — ABNORMAL HIGH (ref 0.7–3.1)
Lymphs: 38 %
MCH: 31.1 pg (ref 26.6–33.0)
MCHC: 32.8 g/dL (ref 31.5–35.7)
MCV: 95 fL (ref 79–97)
Monocytes Absolute: 0.7 10*3/uL (ref 0.1–0.9)
Monocytes: 8 %
Neutrophils Absolute: 4.1 10*3/uL (ref 1.4–7.0)
Neutrophils: 49 %
Platelets: 187 10*3/uL (ref 150–450)
RBC: 5.08 x10E6/uL (ref 4.14–5.80)
RDW: 13.4 % (ref 11.6–15.4)
WBC: 8.3 10*3/uL (ref 3.4–10.8)

## 2022-02-10 LAB — AMYLASE: Amylase: 60 U/L (ref 31–110)

## 2022-02-10 LAB — LIPASE: Lipase: 37 U/L (ref 13–78)

## 2022-02-20 DIAGNOSIS — G4733 Obstructive sleep apnea (adult) (pediatric): Secondary | ICD-10-CM | POA: Diagnosis not present

## 2022-02-27 ENCOUNTER — Encounter: Payer: Self-pay | Admitting: Cardiology

## 2022-02-27 ENCOUNTER — Encounter: Payer: Self-pay | Admitting: Physician Assistant

## 2022-03-13 DIAGNOSIS — N401 Enlarged prostate with lower urinary tract symptoms: Secondary | ICD-10-CM | POA: Diagnosis not present

## 2022-03-13 DIAGNOSIS — R972 Elevated prostate specific antigen [PSA]: Secondary | ICD-10-CM | POA: Diagnosis not present

## 2022-03-13 DIAGNOSIS — Z8042 Family history of malignant neoplasm of prostate: Secondary | ICD-10-CM | POA: Diagnosis not present

## 2022-03-16 ENCOUNTER — Encounter: Payer: Self-pay | Admitting: Cardiology

## 2022-03-21 DIAGNOSIS — G4733 Obstructive sleep apnea (adult) (pediatric): Secondary | ICD-10-CM | POA: Diagnosis not present

## 2022-04-05 ENCOUNTER — Telehealth: Payer: Self-pay

## 2022-04-05 NOTE — Telephone Encounter (Signed)
**Note De-Identified Randall Rojas Obfuscation** Ranolazine PA started through covermymeds. Key: [id number]

## 2022-04-12 NOTE — Telephone Encounter (Signed)
**Note De-Identified Fedrick Cefalu Obfuscation** Juliette Alcide (Key: [place]) PA Case ID #: [id number] Rx #: [id number] Outcome Approved on January 11 Your PA request has been approved.  Authorization Expiration Date: 04/06/2023 Drug Ranolazine ER 500MG  er tablets ePA cloud Child psychotherapist Electronic PA Form [id number] NCPDP)  I have notified Eleanor, [place] (Ph: [phone]) of this approval.

## 2022-04-19 DIAGNOSIS — Z8042 Family history of malignant neoplasm of prostate: Secondary | ICD-10-CM | POA: Diagnosis not present

## 2022-04-19 DIAGNOSIS — R972 Elevated prostate specific antigen [PSA]: Secondary | ICD-10-CM | POA: Diagnosis not present

## 2022-04-19 DIAGNOSIS — N401 Enlarged prostate with lower urinary tract symptoms: Secondary | ICD-10-CM | POA: Diagnosis not present

## 2022-04-21 ENCOUNTER — Other Ambulatory Visit: Payer: Self-pay | Admitting: Physician Assistant

## 2022-04-21 DIAGNOSIS — G4733 Obstructive sleep apnea (adult) (pediatric): Secondary | ICD-10-CM | POA: Diagnosis not present

## 2022-04-25 ENCOUNTER — Other Ambulatory Visit: Payer: Self-pay | Admitting: Physician Assistant

## 2022-05-02 ENCOUNTER — Ambulatory Visit: Payer: Medicaid Other | Admitting: Physician Assistant

## 2022-05-02 ENCOUNTER — Ambulatory Visit: Payer: 59 | Admitting: Physician Assistant

## 2022-05-02 ENCOUNTER — Encounter: Payer: Medicaid Other | Admitting: Physician Assistant

## 2022-05-02 LAB — POC COVID19 BINAXNOW: SARS Coronavirus 2 Ag: NEGATIVE

## 2022-05-09 ENCOUNTER — Ambulatory Visit: Payer: 59 | Admitting: Physician Assistant

## 2022-05-30 DIAGNOSIS — N401 Enlarged prostate with lower urinary tract symptoms: Secondary | ICD-10-CM | POA: Diagnosis not present

## 2022-05-30 DIAGNOSIS — Z8042 Family history of malignant neoplasm of prostate: Secondary | ICD-10-CM | POA: Diagnosis not present

## 2022-05-30 DIAGNOSIS — R972 Elevated prostate specific antigen [PSA]: Secondary | ICD-10-CM | POA: Diagnosis not present

## 2022-05-31 ENCOUNTER — Other Ambulatory Visit: Payer: Self-pay | Admitting: Physician Assistant

## 2022-05-31 DIAGNOSIS — J441 Chronic obstructive pulmonary disease with (acute) exacerbation: Secondary | ICD-10-CM | POA: Diagnosis not present

## 2022-06-05 DIAGNOSIS — N401 Enlarged prostate with lower urinary tract symptoms: Secondary | ICD-10-CM | POA: Diagnosis not present

## 2022-06-05 DIAGNOSIS — C61 Malignant neoplasm of prostate: Secondary | ICD-10-CM | POA: Diagnosis not present

## 2022-06-06 ENCOUNTER — Other Ambulatory Visit: Payer: Self-pay | Admitting: Physician Assistant

## 2022-06-06 DIAGNOSIS — M791 Myalgia, unspecified site: Secondary | ICD-10-CM

## 2022-06-06 MED ORDER — AMITRIPTYLINE HCL 50 MG PO TABS: 50.0000 mg | ORAL_TABLET | Freq: Every day | ORAL | 1 refills | Status: DC

## 2022-06-07 DIAGNOSIS — E785 Hyperlipidemia, unspecified: Secondary | ICD-10-CM | POA: Diagnosis not present

## 2022-06-07 DIAGNOSIS — I5032 Chronic diastolic (congestive) heart failure: Secondary | ICD-10-CM | POA: Diagnosis not present

## 2022-06-07 DIAGNOSIS — Z8616 Personal history of COVID-19: Secondary | ICD-10-CM | POA: Diagnosis not present

## 2022-06-07 DIAGNOSIS — Z20822 Contact with and (suspected) exposure to covid-19: Secondary | ICD-10-CM | POA: Diagnosis not present

## 2022-06-07 DIAGNOSIS — Z87891 Personal history of nicotine dependence: Secondary | ICD-10-CM | POA: Diagnosis not present

## 2022-06-07 DIAGNOSIS — Z1152 Encounter for screening for COVID-19: Secondary | ICD-10-CM | POA: Diagnosis not present

## 2022-06-07 DIAGNOSIS — G4733 Obstructive sleep apnea (adult) (pediatric): Secondary | ICD-10-CM | POA: Diagnosis not present

## 2022-06-07 DIAGNOSIS — K219 Gastro-esophageal reflux disease without esophagitis: Secondary | ICD-10-CM | POA: Diagnosis not present

## 2022-06-07 DIAGNOSIS — I5030 Unspecified diastolic (congestive) heart failure: Secondary | ICD-10-CM | POA: Diagnosis not present

## 2022-06-07 DIAGNOSIS — J9811 Atelectasis: Secondary | ICD-10-CM | POA: Diagnosis not present

## 2022-06-07 DIAGNOSIS — Z8673 Personal history of transient ischemic attack (TIA), and cerebral infarction without residual deficits: Secondary | ICD-10-CM | POA: Diagnosis not present

## 2022-06-07 DIAGNOSIS — G43909 Migraine, unspecified, not intractable, without status migrainosus: Secondary | ICD-10-CM | POA: Diagnosis not present

## 2022-06-07 DIAGNOSIS — Z7951 Long term (current) use of inhaled steroids: Secondary | ICD-10-CM | POA: Diagnosis not present

## 2022-06-07 DIAGNOSIS — J9601 Acute respiratory failure with hypoxia: Secondary | ICD-10-CM | POA: Diagnosis not present

## 2022-06-07 DIAGNOSIS — R0600 Dyspnea, unspecified: Secondary | ICD-10-CM | POA: Diagnosis not present

## 2022-06-07 DIAGNOSIS — G40909 Epilepsy, unspecified, not intractable, without status epilepticus: Secondary | ICD-10-CM | POA: Diagnosis not present

## 2022-06-07 DIAGNOSIS — I2510 Atherosclerotic heart disease of native coronary artery without angina pectoris: Secondary | ICD-10-CM | POA: Diagnosis not present

## 2022-06-07 DIAGNOSIS — Z8701 Personal history of pneumonia (recurrent): Secondary | ICD-10-CM | POA: Diagnosis not present

## 2022-06-07 DIAGNOSIS — B9789 Other viral agents as the cause of diseases classified elsewhere: Secondary | ICD-10-CM | POA: Diagnosis not present

## 2022-06-07 DIAGNOSIS — Z79899 Other long term (current) drug therapy: Secondary | ICD-10-CM | POA: Diagnosis not present

## 2022-06-07 DIAGNOSIS — R0602 Shortness of breath: Secondary | ICD-10-CM | POA: Diagnosis not present

## 2022-06-07 DIAGNOSIS — F40240 Claustrophobia: Secondary | ICD-10-CM | POA: Diagnosis not present

## 2022-06-07 DIAGNOSIS — Z91199 Patient's noncompliance with other medical treatment and regimen due to unspecified reason: Secondary | ICD-10-CM | POA: Diagnosis not present

## 2022-06-07 DIAGNOSIS — C61 Malignant neoplasm of prostate: Secondary | ICD-10-CM | POA: Diagnosis not present

## 2022-06-08 DIAGNOSIS — B348 Other viral infections of unspecified site: Secondary | ICD-10-CM | POA: Diagnosis not present

## 2022-06-08 DIAGNOSIS — R0602 Shortness of breath: Secondary | ICD-10-CM | POA: Diagnosis not present

## 2022-06-08 DIAGNOSIS — J9601 Acute respiratory failure with hypoxia: Secondary | ICD-10-CM | POA: Diagnosis not present

## 2022-06-08 DIAGNOSIS — G4733 Obstructive sleep apnea (adult) (pediatric): Secondary | ICD-10-CM | POA: Diagnosis not present

## 2022-06-08 DIAGNOSIS — I5032 Chronic diastolic (congestive) heart failure: Secondary | ICD-10-CM | POA: Diagnosis not present

## 2022-06-14 ENCOUNTER — Ambulatory Visit: Payer: Medicaid Other | Admitting: Physician Assistant

## 2022-06-14 ENCOUNTER — Encounter: Payer: Self-pay | Admitting: Physician Assistant

## 2022-06-14 ENCOUNTER — Other Ambulatory Visit: Payer: Self-pay | Admitting: Physician Assistant

## 2022-06-14 VITALS — BP 128/70 | HR 88 | Temp 97.4°F | Ht 63.0 in | Wt 314.0 lb

## 2022-06-14 DIAGNOSIS — R110 Nausea: Secondary | ICD-10-CM | POA: Insufficient documentation

## 2022-06-14 DIAGNOSIS — E782 Mixed hyperlipidemia: Secondary | ICD-10-CM | POA: Diagnosis not present

## 2022-06-14 DIAGNOSIS — C61 Malignant neoplasm of prostate: Secondary | ICD-10-CM | POA: Diagnosis not present

## 2022-06-14 DIAGNOSIS — K219 Gastro-esophageal reflux disease without esophagitis: Secondary | ICD-10-CM | POA: Diagnosis not present

## 2022-06-14 DIAGNOSIS — R7303 Prediabetes: Secondary | ICD-10-CM

## 2022-06-14 DIAGNOSIS — I10 Essential (primary) hypertension: Secondary | ICD-10-CM

## 2022-06-14 DIAGNOSIS — E559 Vitamin D deficiency, unspecified: Secondary | ICD-10-CM | POA: Diagnosis not present

## 2022-06-14 MED ORDER — PROMETHAZINE HCL 25 MG PO TABS: 25.0000 mg | ORAL_TABLET | Freq: Four times a day (QID) | ORAL | 0 refills | Status: DC | PRN

## 2022-06-14 NOTE — Progress Notes (Signed)
Established Patient Office Visit  Subjective:  Patient ID: Randall Rojas, male    DOB: 20-Feb-1961  Age: 62 y.o. MRN: [id number]  CC:  Chief Complaint  Patient presents with   Hypertension   Hyperlipidemia    HPI Randall Rojas presents for follow up of chronic medical issues   Mixed hyperlipidemia  Pt presents with hyperlipidemia. Compliance with treatment has been fair; The patient maintains a low cholesterol diet , follows up as directed ,  The patient denies experiencing any hypercholesterolemia related symptoms.  Currently on crestor 10mg  qd  Pt has history of edema - he takes bumex 2mg  prn swelling along with potassium 39meq- states symptoms are stable at this time  Pt continues to follow with neurology for history of headaches , history of TIA - he does follow up with them regularly -- provider amy Teryl Lucy NP He is currently taking aimovig, elavil, keppra,   Pt also sees counselor for anxiety regarding these issues as well and taking buspar and zoloft  Pt with history of GERD - pt using omeprazole 40mg  as directed  Pt sees cardiology Dr Harriet Masson on a regular basis  -  Pt with history of CAD - currently on ASA 325mg  qd and ranexa 500mg   Had recent follow up appt  Pt with history of Vit D def - taking weekly supplement - due for labwork  Pt was recently diagnosed with prostate cancer - he has appt this Friday with Dr Nila Nephew to discuss treatment options  Pt states that over the past 2 weeks he has noted nausea mostly at night - requests refill of phenergan.  He was just treated with levaquin for presumed pneumonia -- Recommend to follow up and will consider GI referral if symptoms persist Past Medical History:  Diagnosis Date   Acute laryngopharyngitis 11/11/2020   Acute URI 12/12/2019   Anxiety 06/17/2020   Arthralgia 06/17/2020   Asthma    Bilateral leg edema 06/23/2020   Chest pain 04/15/2015   Chronic diastolic heart failure ([place]) 08/05/2020   Chronic heart failure with  preserved ejection fraction ([place]) 07/11/2020   Coronary artery calcification seen on CT scan 04/15/2015   Coronary artery disease    Coronary artery disease involving native coronary artery of native heart without angina pectoris 07/11/2020   Coronary artery disease of native artery of native heart with stable angina pectoris ([place]) 06/23/2020   Essential hypertension 04/15/2015   Exertional dyspnea 05/26/2020   Frequent headaches 07/20/2020   GERD (gastroesophageal reflux disease) 05/27/2019   History of CVA in adulthood 08/05/2020   HLD (hyperlipidemia) 07/11/2020   Hyperglycemia 06/06/2015   Hyperlipidemia 04/15/2015   Hypertension 08/05/2020   Hypokalemia 07/20/2020   Left sided numbness 07/25/2020   Leukocytosis 06/06/2015   Malaise 06/17/2020   Mixed hyperlipidemia 04/15/2015   Morbid obesity ([place]) 04/15/2015   Need for prophylactic vaccination against Streptococcus pneumoniae (pneumococcus) 12/03/2019   Need for prophylactic vaccination and inoculation against influenza 12/03/2019   Need for tetanus booster 06/17/2020   Nonepileptic episode ([place]) 10/20/2020   Obesity, Class III, BMI 40-49.9 (morbid obesity) ([place]) 04/15/2015   Other sleep apnea 07/20/2020   Palpitations 08/05/2020   Pneumonia due to COVID-19 virus 04/06/2019   Prediabetes 06/23/2020   Prostate cancer screening 06/02/2019   Rectal bleeding 11/01/2020   Seizure-like activity ([place]) 05/26/2020   Shortness of breath 06/23/2020   TIA (transient ischemic attack)    Upper extremity weakness 05/26/2020   Weakness of left lower extremity 05/26/2020  Past Surgical History:  Procedure Laterality Date   CARDIAC CATHETERIZATION N/A 04/22/2015   Procedure: Left Heart Cath and Coronary Angiography;  Surgeon: Peter M Martinique, MD;  Location: [facility];  Service: Cardiovascular;  Laterality: N/A;   GALLBLADDER SURGERY     LEFT HEART CATH AND CORONARY ANGIOGRAPHY N/A 10/28/2020   Procedure: LEFT HEART CATH AND CORONARY ANGIOGRAPHY;  Surgeon: Troy Sine, MD;  Location: [facility];  Service: Cardiovascular;  Laterality: N/A;   SINUS SURGERY WITH INSTATRAK      Family History  Problem Relation Age of Onset   Hypertension Mother    Rheum arthritis Mother    Cancer Father    Heart attack Father    Cancer Brother    Hypertension Sister     Social History   Socioeconomic History   Marital status: Married    Spouse name: Not on file   Number of children: Not on file   Years of education: Not on file   Highest education level: Not on file  Occupational History   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  Tobacco Use   Smoking status: Former    Packs/day: 3    Types: Cigarettes    Quit date: 04/13/1989    Years since quitting: 33.1   Smokeless tobacco: Never  Substance and Sexual Activity   Alcohol use: Never   Drug use: Never   Sexual activity: Not Currently  Other Topics Concern   Not on file  Social History Narrative   Epworth Sleepiness Scale = 7 (as of 1/18/207)   Social Determinants of Health   Financial Resource Strain: High Risk (06/17/2021)   Overall Financial Resource Strain (CARDIA)    Difficulty of Paying Living Expenses: Hard  Food Insecurity: Not on file  Transportation Needs: No Transportation Needs (06/17/2021)   PRAPARE - Transportation    Lack of Transportation (Medical): No    Lack of Transportation (Non-Medical): No  Physical Activity: Not on file  Stress: Not on file  Social Connections: Not on file  Intimate Partner Violence: Not on file     Current Outpatient Medications:    acetaminophen (TYLENOL) 500 MG tablet, Take by mouth., Disp: , Rfl:    albuterol (VENTOLIN HFA) 108 (90 Base) MCG/ACT inhaler, INHALE 2 PUFFS INTO THE LUNGS EVERY 6 HOURS AS NEEDED FOR WHEEZING OR SHORTNESS OF BREATH., Disp: 8.5 g, Rfl: 1   amitriptyline (ELAVIL) 50 MG tablet, Take 1 tablet (50 mg total) by mouth at bedtime., Disp: 30 tablet, Rfl: 1   aspirin 325 MG tablet, Take 325 mg by mouth daily., Disp: , Rfl:     baclofen (LIORESAL) 10 MG tablet, Take 10-20 tablets by mouth every 6 (six) hours as needed (pain)., Disp: , Rfl:    blood glucose meter kit and supplies KIT, Dispense based on patient and insurance preference. Use up to four times daily as directed., Disp: 1 each, Rfl: 0   bumetanide (BUMEX) 2 MG tablet, Take 1 tablet (2 mg total) by mouth 2 (two) times daily., Disp: 180 tablet, Rfl: 3   busPIRone (BUSPAR) 10 MG tablet, Take 10 mg by mouth 2 (two) times daily., Disp: , Rfl:    ipratropium-albuterol (DUONEB) 0.5-2.5 (3) MG/3ML SOLN, Inhale into the lungs., Disp: , Rfl:    ketoconazole (NIZORAL) 2 % shampoo, Apply topically., Disp: , Rfl:    levETIRAcetam (KEPPRA) 750 MG tablet, Take 1,500 mg by mouth 2 (two) times daily., Disp: , Rfl:    nitroGLYCERIN (NITROSTAT)  0.4 MG SL tablet, Place 1 tablet (0.4 mg total) under the tongue every 5 (five) minutes as needed for chest pain. Up to three times., Disp: 45 tablet, Rfl: 3   omeprazole (PRILOSEC) 40 MG capsule, TAKE 1 CAPSULE BY MOUTH ONCE DAILY, Disp: 30 capsule, Rfl: 3   potassium chloride SA (KLOR-CON M) 20 MEQ tablet, Take 1 tablet (20 mEq total) by mouth 2 (two) times daily., Disp: 180 tablet, Rfl: 3   promethazine (PHENERGAN) 25 MG tablet, Take 1 tablet (25 mg total) by mouth every 6 (six) hours as needed for nausea or vomiting., Disp: 30 tablet, Rfl: 0   ranolazine (RANEXA) 500 MG 12 hr tablet, Take 1 tablet (500 mg total) by mouth 2 (two) times daily., Disp: 180 tablet, Rfl: 3   rosuvastatin (CRESTOR) 10 MG tablet, Take 1 tablet (10 mg total) by mouth daily., Disp: 90 tablet, Rfl: 3   sertraline (ZOLOFT) 100 MG tablet, Take 200 mg by mouth daily., Disp: , Rfl:    vitamin B-12 (CYANOCOBALAMIN) 1000 MCG tablet, Take 1,000 mcg by mouth daily., Disp: , Rfl:    Vitamin D, Ergocalciferol, 50000 units CAPS, Take 1 capsule by mouth once every 7 days as directed, Disp: 12 capsule, Rfl: 3   Allergies  Allergen Reactions   Cephalexin Nausea And Vomiting  and Rash   Ubrogepant Swelling   CONSTITUTIONAL: Negative for chills, fatigue, fever, unintentional weight gain and unintentional weight loss.  E/N/T: Negative for ear pain, nasal congestion and sore throat.  CARDIOVASCULAR: Negative for chest pain, dizziness, palpitations and pedal edema.  RESPIRATORY: Negative for recent cough and dyspnea.  GASTROINTESTINAL: see HPI GU - see HPI MSK: Negative for arthralgias and myalgias.  INTEGUMENTARY: Negative for rash.  NEUROLOGICAL: Negative for dizziness and headaches.  PSYCHIATRIC: Negative for sleep disturbance and to question depression screen.  Negative for depression, negative for anhedonia.        Objective:  PHYSICAL EXAM:   VS: BP 128/70 (BP Location: Left Arm, Patient Position: Sitting, Cuff Size: Large)   Pulse 88   Temp (!) 97.4 F (36.3 C) (Temporal)   Ht 5\' 3"  (1.6 m)   Wt (!) 314 lb (142.4 kg)   SpO2 97%   BMI 55.62 kg/m   GEN: Well nourished, well developed, in no acute distress  Cardiac: RRR; no murmurs, rubs, or gallops,no edema -  Respiratory:  normal respiratory rate and pattern with no distress - normal breath sounds with no rales, rhonchi, wheezes or rubs GI: normal bowel sounds, no masses or tenderness MS: no deformity or atrophy  Skin: warm and dry, no rash  Psych: euthymic mood, appropriate affect and demeanor    There are no preventive care reminders to display for this patient.  Lab Results  Component Value Date   TSH 2.330 12/26/2021   Lab Results  Component Value Date   WBC 8.3 02/09/2022   HGB 15.8 02/09/2022   HCT 48.1 02/09/2022   MCV 95 02/09/2022   PLT 187 02/09/2022   Lab Results  Component Value Date   NA 141 02/09/2022   K 4.0 02/09/2022   CO2 29 02/09/2022   GLUCOSE 94 02/09/2022   BUN 10 02/09/2022   CREATININE 0.98 02/09/2022   BILITOT 0.4 02/09/2022   ALKPHOS 99 02/09/2022   AST 26 02/09/2022   ALT 31 02/09/2022   PROT 6.8 02/09/2022   ALBUMIN 4.3 02/09/2022   CALCIUM  9.4 02/09/2022   ANIONGAP 11 04/07/2019   EGFR 88 02/09/2022  Lab Results  Component Value Date   CHOL 132 12/26/2021   Lab Results  Component Value Date   HDL 35 (L) 12/26/2021   Lab Results  Component Value Date   LDLCALC 73 12/26/2021   Lab Results  Component Value Date   TRIG 134 12/26/2021   Lab Results  Component Value Date   CHOLHDL 3.8 12/26/2021   Lab Results  Component Value Date   HGBA1C 6.0 (H) 12/26/2021      Assessment & Plan:   Problem List Items Addressed This Visit                                    Mixed hyperlipidemia   Relevant Medications   isosorbide mononitrate (IMDUR) 15 mg TB24 24 hr tablet Continue crestor 10mg  qd   Other Relevant Orders   Lipid panel Continue meds Watch diet    Hx TIA Continue meds and follow up with neurology as directed Continue ASA  GERD Rx for omeprazole as directed   CAD Continue meds as directed Labwork pending Follow up with cardiology as directed  Anxiety Continue buspar and zoloft as directed  Vit D def Continue supplement Labwork pending    Prostate cancer Follow up with urology as directed  Nausea Refill phenergan Follow up if symptoms persist/worsen                      Meds ordered this encounter  Medications   promethazine (PHENERGAN) 25 MG tablet    Sig: Take 1 tablet (25 mg total) by mouth every 6 (six) hours as needed for nausea or vomiting.    Dispense:  30 tablet    Refill:  0    Order Specific Question:   Supervising Provider    Answer:   Shelton Silvas     Follow-up: Return in about 3 months (around 09/14/2022) for fasting follow up.    SARA R Shaila Gilchrest, PA-C

## 2022-06-15 LAB — COMPREHENSIVE METABOLIC PANEL
ALT: 27 IU/L (ref 0–44)
AST: 19 IU/L (ref 0–40)
Albumin/Globulin Ratio: 1.6 (ref 1.2–2.2)
Albumin: 3.9 g/dL (ref 3.9–4.9)
Alkaline Phosphatase: 97 IU/L (ref 44–121)
BUN/Creatinine Ratio: 12 (ref 10–24)
BUN: 14 mg/dL (ref 8–27)
Bilirubin Total: 0.7 mg/dL (ref 0.0–1.2)
CO2: 24 mmol/L (ref 20–29)
Calcium: 8.7 mg/dL (ref 8.6–10.2)
Chloride: 104 mmol/L (ref 96–106)
Creatinine, Ser: 1.2 mg/dL (ref 0.76–1.27)
Globulin, Total: 2.4 g/dL (ref 1.5–4.5)
Glucose: 98 mg/dL (ref 70–99)
Potassium: 4.4 mmol/L (ref 3.5–5.2)
Sodium: 143 mmol/L (ref 134–144)
Total Protein: 6.3 g/dL (ref 6.0–8.5)
eGFR: 69 mL/min/{1.73_m2} (ref >59)

## 2022-06-15 LAB — LIPID PANEL
Chol/HDL Ratio: 3.6 ratio (ref 0.0–5.0)
Cholesterol, Total: 138 mg/dL (ref 100–199)
HDL: 38 mg/dL — ABNORMAL LOW (ref >39)
LDL Chol Calc (NIH): 76 mg/dL (ref 0–99)
Triglycerides: 137 mg/dL (ref 0–149)
VLDL Cholesterol Cal: 24 mg/dL (ref 5–40)

## 2022-06-15 LAB — CBC WITH DIFFERENTIAL/PLATELET
Basophils Absolute: 0.1 10*3/uL (ref 0.0–0.2)
Basos: 1 %
EOS (ABSOLUTE): 0.2 10*3/uL (ref 0.0–0.4)
Eos: 2 %
Hematocrit: 44.9 % (ref 37.5–51.0)
Hemoglobin: 14.8 g/dL (ref 13.0–17.7)
Immature Grans (Abs): 0.1 10*3/uL (ref 0.0–0.1)
Immature Granulocytes: 1 %
Lymphocytes Absolute: 3 10*3/uL (ref 0.7–3.1)
Lymphs: 34 %
MCH: 31 pg (ref 26.6–33.0)
MCHC: 33 g/dL (ref 31.5–35.7)
MCV: 94 fL (ref 79–97)
Monocytes Absolute: 0.5 10*3/uL (ref 0.1–0.9)
Monocytes: 6 %
Neutrophils Absolute: 4.9 10*3/uL (ref 1.4–7.0)
Neutrophils: 56 %
Platelets: 136 10*3/uL — ABNORMAL LOW (ref 150–450)
RBC: 4.78 x10E6/uL (ref 4.14–5.80)
RDW: 13.1 % (ref 11.6–15.4)
WBC: 8.6 10*3/uL (ref 3.4–10.8)

## 2022-06-15 LAB — AMYLASE: Amylase: 75 U/L (ref 31–110)

## 2022-06-15 LAB — TSH: TSH: 2.12 u[IU]/mL (ref 0.450–4.500)

## 2022-06-15 LAB — LIPASE: Lipase: 57 U/L (ref 13–78)

## 2022-06-15 LAB — VITAMIN D 25 HYDROXY (VIT D DEFICIENCY, FRACTURES): Vit D, 25-Hydroxy: 50.3 ng/mL (ref 30.0–100.0)

## 2022-06-15 LAB — CARDIOVASCULAR RISK ASSESSMENT

## 2022-06-15 LAB — HEMOGLOBIN A1C
Est. average glucose Bld gHb Est-mCnc: 131 mg/dL
Hgb A1c MFr Bld: 6.2 % — ABNORMAL HIGH (ref 4.8–5.6)

## 2022-06-19 DIAGNOSIS — G4733 Obstructive sleep apnea (adult) (pediatric): Secondary | ICD-10-CM | POA: Diagnosis not present

## 2022-06-28 DIAGNOSIS — C61 Malignant neoplasm of prostate: Secondary | ICD-10-CM | POA: Diagnosis not present

## 2022-06-28 DIAGNOSIS — K760 Fatty (change of) liver, not elsewhere classified: Secondary | ICD-10-CM | POA: Diagnosis not present

## 2022-06-29 ENCOUNTER — Encounter: Payer: Self-pay | Admitting: Physician Assistant

## 2022-06-30 ENCOUNTER — Other Ambulatory Visit: Payer: Self-pay | Admitting: Physician Assistant

## 2022-07-03 ENCOUNTER — Other Ambulatory Visit: Payer: Self-pay | Admitting: Physician Assistant

## 2022-07-03 DIAGNOSIS — K219 Gastro-esophageal reflux disease without esophagitis: Secondary | ICD-10-CM

## 2022-07-07 DIAGNOSIS — C61 Malignant neoplasm of prostate: Secondary | ICD-10-CM | POA: Diagnosis not present

## 2022-07-07 DIAGNOSIS — N401 Enlarged prostate with lower urinary tract symptoms: Secondary | ICD-10-CM | POA: Diagnosis not present

## 2022-07-07 DIAGNOSIS — N3289 Other specified disorders of bladder: Secondary | ICD-10-CM | POA: Diagnosis not present

## 2022-07-09 DIAGNOSIS — R0602 Shortness of breath: Secondary | ICD-10-CM | POA: Diagnosis not present

## 2022-07-09 DIAGNOSIS — I5032 Chronic diastolic (congestive) heart failure: Secondary | ICD-10-CM | POA: Diagnosis not present

## 2022-07-12 ENCOUNTER — Encounter: Payer: Self-pay | Admitting: Physician Assistant

## 2022-07-12 ENCOUNTER — Ambulatory Visit: Payer: Medicaid Other | Admitting: Physician Assistant

## 2022-07-12 VITALS — BP 110/78 | HR 92 | Temp 97.5°F | Resp 14 | Ht 63.0 in | Wt 306.0 lb

## 2022-07-12 DIAGNOSIS — R899 Unspecified abnormal finding in specimens from other organs, systems and tissues: Secondary | ICD-10-CM | POA: Insufficient documentation

## 2022-07-12 DIAGNOSIS — C61 Malignant neoplasm of prostate: Secondary | ICD-10-CM

## 2022-07-12 NOTE — Progress Notes (Signed)
Established Patient Office Visit  Subjective:  Patient ID: Randall Rojas, male    DOB: 05/12/1960  Age: 62 y.o. MRN: [id number]  CC:  Chief Complaint  Patient presents with   Joint Pain   Prostate Cancer    HPI Randall Rojas presents for follow up  Pt was seen last month and had a moderate amount of nausea.  He states that he actually has started eating / drinking with some of his meds and thinks that has made quite a difference and no longer having nausea.  States appetite is good and not having any trouble with bowel movements  Last labwork showed slightly abnormal cbc  -due to recheck labwork  Pt has seen Dr Saddie Benders since last visit - he has been diagnosed with Stage 1C prostate cancer.  Next follow up with his office is in 4 months  Past Medical History:  Diagnosis Date   Acute laryngopharyngitis 11/11/2020   Acute URI 12/12/2019   Anxiety 06/17/2020   Arthralgia 06/17/2020   Asthma    Bilateral leg edema 06/23/2020   Chest pain 04/15/2015   Chronic diastolic heart failure 08/05/2020   Chronic heart failure with preserved ejection fraction 07/11/2020   Coronary artery calcification seen on CT scan 04/15/2015   Coronary artery disease    Coronary artery disease involving native coronary artery of native heart without angina pectoris 07/11/2020   Coronary artery disease of native artery of native heart with stable angina pectoris 06/23/2020   Essential hypertension 04/15/2015   Exertional dyspnea 05/26/2020   Frequent headaches 07/20/2020   GERD (gastroesophageal reflux disease) 05/27/2019   History of CVA in adulthood 08/05/2020   HLD (hyperlipidemia) 07/11/2020   Hyperglycemia 06/06/2015   Hyperlipidemia 04/15/2015   Hypertension 08/05/2020   Hypokalemia 07/20/2020   Left sided numbness 07/25/2020   Leukocytosis 06/06/2015   Malaise 06/17/2020   Mixed hyperlipidemia 04/15/2015   Morbid obesity 04/15/2015   Need for prophylactic vaccination against Streptococcus pneumoniae (pneumococcus)  12/03/2019   Need for prophylactic vaccination and inoculation against influenza 12/03/2019   Need for tetanus booster 06/17/2020   Nonepileptic episode 10/20/2020   Obesity, Class III, BMI 40-49.9 (morbid obesity) 04/15/2015   Other sleep apnea 07/20/2020   Palpitations 08/05/2020   Pneumonia due to COVID-19 virus 04/06/2019   Prediabetes 06/23/2020   Prostate cancer screening 06/02/2019   Rectal bleeding 11/01/2020   Seizure-like activity 05/26/2020   Shortness of breath 06/23/2020   TIA (transient ischemic attack)    Upper extremity weakness 05/26/2020   Weakness of left lower extremity 05/26/2020    Past Surgical History:  Procedure Laterality Date   CARDIAC CATHETERIZATION N/A 04/22/2015   Procedure: Left Heart Cath and Coronary Angiography;  Surgeon: Peter M Swaziland, MD;  Location: [facility] INVASIVE CV LAB;  Service: Cardiovascular;  Laterality: N/A;   GALLBLADDER SURGERY     LEFT HEART CATH AND CORONARY ANGIOGRAPHY N/A 10/28/2020   Procedure: LEFT HEART CATH AND CORONARY ANGIOGRAPHY;  Surgeon: Lennette Bihari, MD;  Location: [facility] INVASIVE CV LAB;  Service: Cardiovascular;  Laterality: N/A;   SINUS SURGERY WITH INSTATRAK      Family History  Problem Relation Age of Onset   Hypertension Mother    Rheum arthritis Mother    Cancer Father    Heart attack Father    Cancer Brother    Hypertension Sister     Social History   Socioeconomic History   Marital status: Married    Spouse name: Not on file  Number of children: Not on file   Years of education: Not on file   Highest education level: Not on file  Occupational History   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  Tobacco Use   Smoking status: Former    Packs/day: 3    Types: Cigarettes    Quit date: 04/13/1989    Years since quitting: 33.2   Smokeless tobacco: Never  Substance and Sexual Activity   Alcohol use: Never   Drug use: Never   Sexual activity: Not Currently  Other Topics Concern   Not on file  Social History Narrative   Epworth  Sleepiness Scale = 7 (as of 1/18/207)   Social Determinants of Health   Financial Resource Strain: High Risk (06/17/2021)   Overall Financial Resource Strain (CARDIA)    Difficulty of Paying Living Expenses: Hard  Food Insecurity: Not on file  Transportation Needs: No Transportation Needs (06/17/2021)   PRAPARE - Transportation    Lack of Transportation (Medical): No    Lack of Transportation (Non-Medical): No  Physical Activity: Not on file  Stress: Not on file  Social Connections: Not on file  Intimate Partner Violence: Not on file     Current Outpatient Medications:    acetaminophen (TYLENOL) 500 MG tablet, Take by mouth., Disp: , Rfl:    amitriptyline (ELAVIL) 50 MG tablet, Take 1 tablet (50 mg total) by mouth at bedtime., Disp: 30 tablet, Rfl: 1   B-12, Methylcobalamin, 1000 MCG SUBL, Take 1 tablet by mouth daily., Disp: , Rfl:    baclofen (LIORESAL) 10 MG tablet, Take 10-20 tablets by mouth every 6 (six) hours as needed (pain)., Disp: , Rfl:    blood glucose meter kit and supplies KIT, Dispense based on patient and insurance preference. Use up to four times daily as directed., Disp: 1 each, Rfl: 0   bumetanide (BUMEX) 2 MG tablet, Take 1 tablet (2 mg total) by mouth 2 (two) times daily., Disp: 180 tablet, Rfl: 3   busPIRone (BUSPAR) 10 MG tablet, Take 10 mg by mouth 2 (two) times daily., Disp: , Rfl:    ipratropium-albuterol (DUONEB) 0.5-2.5 (3) MG/3ML SOLN, Inhale into the lungs., Disp: , Rfl:    ketoconazole (NIZORAL) 2 % shampoo, Apply topically., Disp: , Rfl:    levETIRAcetam (KEPPRA) 750 MG tablet, Take 1,500 mg by mouth 2 (two) times daily., Disp: , Rfl:    nitroGLYCERIN (NITROSTAT) 0.4 MG SL tablet, Place 1 tablet (0.4 mg total) under the tongue every 5 (five) minutes as needed for chest pain. Up to three times., Disp: 45 tablet, Rfl: 3   NON FORMULARY, CPAP, Disp: , Rfl:    NURTEC 75 MG TBDP, Take 1 tablet by mouth every other day., Disp: , Rfl:    omeprazole (PRILOSEC)  40 MG capsule, Take 1 capsule by mouth once daily., Disp: 30 capsule, Rfl: 3   OXYGEN, Inhale 2 L into the lungs daily as needed., Disp: , Rfl:    potassium chloride SA (KLOR-CON M) 20 MEQ tablet, Take 1 tablet (20 mEq total) by mouth 2 (two) times daily., Disp: 180 tablet, Rfl: 3   promethazine (PHENERGAN) 25 MG tablet, Take 1 tablet (25 mg total) by mouth every 6 (six) hours as needed for nausea or vomiting., Disp: 30 tablet, Rfl: 0   ranolazine (RANEXA) 500 MG 12 hr tablet, Take 1 tablet (500 mg total) by mouth 2 (two) times daily., Disp: 180 tablet, Rfl: 3   rosuvastatin (CRESTOR) 10 MG tablet, Take 1 tablet (10 mg total)  by mouth daily., Disp: 90 tablet, Rfl: 3   sertraline (ZOLOFT) 100 MG tablet, Take 200 mg by mouth daily., Disp: , Rfl:    VENTOLIN HFA 108 (90 Base) MCG/ACT inhaler, INHALE 2 PUFFS INTO THE LUNGS EVERY 6 HOURS AS NEEDED FOR WHEEZING OR SHORTNESS OF BREATH., Disp: 18 g, Rfl: 1   vitamin B-12 (CYANOCOBALAMIN) 1000 MCG tablet, Take 1,000 mcg by mouth daily., Disp: , Rfl:    Vitamin D, Ergocalciferol, 50000 units CAPS, Take 1 capsule by mouth once every 7 days as directed, Disp: 12 capsule, Rfl: 3   Allergies  Allergen Reactions   Cephalexin Nausea And Vomiting, Rash and Other (See Comments)   Ubrogepant Swelling and Other (See Comments)    ROS CONSTITUTIONAL: Negative for chills, fatigue, fever, CARDIOVASCULAR: Negative for chest pain,  RESPIRATORY: Negative for recent cough and dyspnea.  GASTROINTESTINAL: Negative for abdominal pain, acid reflux symptoms, constipation, diarrhea, nausea and vomiting.         Objective:    PHYSICAL EXAM:   VS: BP 110/78   Pulse 92   Temp (!) 97.5 F (36.4 C)   Resp 14   Ht  (1.6 m)   Wt (!) 306 lb (138.8 kg)   SpO2 93%   BMI 54.21 kg/m   GEN: Well nourished, well developed, in no acute distress  Cardiac: RRR; no murmurs,  Respiratory:  normal respiratory rate and pattern with no distress - normal breath sounds with  no rales, rhonchi, wheezes or rubs Psych: euthymic mood, appropriate affect and demeanor      Health Maintenance Due  Topic Date Due   Zoster Vaccines- Shingrix (1 of 2) Never done    There are no preventive care reminders to display for this patient.  Lab Results  Component Value Date   TSH 2.120 06/14/2022   Lab Results  Component Value Date   WBC 8.6 06/14/2022   HGB 14.8 06/14/2022   HCT 44.9 06/14/2022   MCV 94 06/14/2022   PLT 136 (L) 06/14/2022   Lab Results  Component Value Date   NA 143 06/14/2022   K 4.4 06/14/2022   CO2 24 06/14/2022   GLUCOSE 98 06/14/2022   BUN 14 06/14/2022   CREATININE 1.20 06/14/2022   BILITOT 0.7 06/14/2022   ALKPHOS 97 06/14/2022   AST 19 06/14/2022   ALT 27 06/14/2022   PROT 6.3 06/14/2022   ALBUMIN 3.9 06/14/2022   CALCIUM 8.7 06/14/2022   ANIONGAP 11 04/07/2019   EGFR 69 06/14/2022   Lab Results  Component Value Date   CHOL 138 06/14/2022   Lab Results  Component Value Date   HDL 38 (L) 06/14/2022   Lab Results  Component Value Date   LDLCALC 76 06/14/2022   Lab Results  Component Value Date   TRIG 137 06/14/2022   Lab Results  Component Value Date   CHOLHDL 3.6 06/14/2022   Lab Results  Component Value Date   HGBA1C 6.2 (H) 06/14/2022      Assessment & Plan:   Problem List Items Addressed This Visit       Genitourinary   Prostate cancer Continue follow up with urology     Other   Abnormal laboratory test result - Primary   Relevant Orders   CBC with Differential/Platelet    No orders of the defined types were placed in this encounter.   Follow-up: Return for in June for chronic visit (should be scheduled already).    SARA R Leo Fray, PA-C

## 2022-07-13 LAB — CBC WITH DIFFERENTIAL/PLATELET
Basophils Absolute: 0.1 10*3/uL (ref 0.0–0.2)
Basos: 1 %
EOS (ABSOLUTE): 0.3 10*3/uL (ref 0.0–0.4)
Eos: 3 %
Hematocrit: 46.5 % (ref 37.5–51.0)
Hemoglobin: 16 g/dL (ref 13.0–17.7)
Immature Grans (Abs): 0 10*3/uL (ref 0.0–0.1)
Immature Granulocytes: 1 %
Lymphocytes Absolute: 3.3 10*3/uL — ABNORMAL HIGH (ref 0.7–3.1)
Lymphs: 39 %
MCH: 31 pg (ref 26.6–33.0)
MCHC: 34.4 g/dL (ref 31.5–35.7)
MCV: 90 fL (ref 79–97)
Monocytes Absolute: 0.6 10*3/uL (ref 0.1–0.9)
Monocytes: 7 %
Neutrophils Absolute: 4.3 10*3/uL (ref 1.4–7.0)
Neutrophils: 49 %
Platelets: 167 10*3/uL (ref 150–450)
RBC: 5.16 x10E6/uL (ref 4.14–5.80)
RDW: 13.5 % (ref 11.6–15.4)
WBC: 8.7 10*3/uL (ref 3.4–10.8)

## 2022-07-25 NOTE — Progress Notes (Signed)
This encounter was created in error - please disregard.

## 2022-07-26 ENCOUNTER — Other Ambulatory Visit: Payer: Self-pay | Admitting: Physician Assistant

## 2022-08-08 DIAGNOSIS — R0602 Shortness of breath: Secondary | ICD-10-CM | POA: Diagnosis not present

## 2022-08-08 DIAGNOSIS — I5032 Chronic diastolic (congestive) heart failure: Secondary | ICD-10-CM | POA: Diagnosis not present

## 2022-08-10 DIAGNOSIS — F330 Major depressive disorder, recurrent, mild: Secondary | ICD-10-CM | POA: Diagnosis not present

## 2022-08-10 DIAGNOSIS — F411 Generalized anxiety disorder: Secondary | ICD-10-CM | POA: Diagnosis not present

## 2022-08-14 ENCOUNTER — Encounter: Payer: Self-pay | Admitting: Physician Assistant

## 2022-08-14 ENCOUNTER — Other Ambulatory Visit: Payer: Self-pay

## 2022-08-15 ENCOUNTER — Telehealth: Payer: Self-pay

## 2022-08-15 ENCOUNTER — Other Ambulatory Visit: Payer: Self-pay | Admitting: Family Medicine

## 2022-08-15 MED ORDER — BENZONATATE 200 MG PO CAPS: 200.0000 mg | ORAL_CAPSULE | Freq: Three times a day (TID) | ORAL | 0 refills | Status: DC | PRN

## 2022-08-16 ENCOUNTER — Ambulatory Visit: Payer: Medicaid Other | Admitting: Physician Assistant

## 2022-08-16 ENCOUNTER — Encounter: Payer: Self-pay | Admitting: Physician Assistant

## 2022-08-16 VITALS — BP 118/70 | HR 87 | Temp 97.5°F | Ht 63.0 in | Wt 311.6 lb

## 2022-08-16 DIAGNOSIS — R0789 Other chest pain: Secondary | ICD-10-CM

## 2022-08-16 DIAGNOSIS — M542 Cervicalgia: Secondary | ICD-10-CM | POA: Insufficient documentation

## 2022-08-16 DIAGNOSIS — R051 Acute cough: Secondary | ICD-10-CM | POA: Diagnosis not present

## 2022-08-16 MED ORDER — NITROGLYCERIN 0.4 MG SL SUBL: 0.4000 mg | SUBLINGUAL_TABLET | SUBLINGUAL | 3 refills | Status: DC | PRN

## 2022-08-16 MED ORDER — BENZONATATE 200 MG PO CAPS: 200.0000 mg | ORAL_CAPSULE | Freq: Three times a day (TID) | ORAL | 1 refills | Status: DC | PRN

## 2022-08-16 NOTE — Progress Notes (Signed)
Acute Office Visit  Subjective:    Patient ID: Randall Rojas, male    DOB: 1961-02-14, 62 y.o.   MRN: [id number]  Chief Complaint  Patient presents with   Back Pain   Neck Pain   Headache    HPI: Patient is in today for initially a cough - appt was scheduled 5 days ago - wife and pt states cough has improved but would like refill of tessalon perles Denies congestion or fever  Pt states that over the weekend he has felt tightness in his upper neck and back - says he thinks he slept wrong - he does have baclofen at home but has not been using - recommend to use as directed -   Pt states that he had one episode of chest discomfort over the weekend and says he took one nitro which really did not seem to help his pain - unsure if it could have been his stomach upset.  He does see cardiology regularly and had a normal left heart cath 8/22 - next appt with cardiology in June    Current Outpatient Medications:    acetaminophen (TYLENOL) 500 MG tablet, Take by mouth., Disp: , Rfl:    amitriptyline (ELAVIL) 50 MG tablet, TAKE ONE TABLET BY MOUTH AT BEDTIME, Disp: 30 tablet, Rfl: 1   B-12, Methylcobalamin, 1000 MCG SUBL, Take 1 tablet by mouth daily., Disp: , Rfl:    baclofen (LIORESAL) 10 MG tablet, Take 10-20 tablets by mouth every 6 (six) hours as needed (pain)., Disp: , Rfl:    blood glucose meter kit and supplies KIT, Dispense based on patient and insurance preference. Use up to four times daily as directed., Disp: 1 each, Rfl: 0   bumetanide (BUMEX) 2 MG tablet, Take 1 tablet (2 mg total) by mouth 2 (two) times daily., Disp: 180 tablet, Rfl: 3   busPIRone (BUSPAR) 30 MG tablet, Take by mouth., Disp: , Rfl:    GOODSENSE ASPIRIN 81 MG chewable tablet, Chew 81 mg by mouth daily., Disp: , Rfl:    ipratropium-albuterol (DUONEB) 0.5-2.5 (3) MG/3ML SOLN, Inhale into the lungs., Disp: , Rfl:    ketoconazole (NIZORAL) 2 % shampoo, Apply topically., Disp: , Rfl:    levETIRAcetam (KEPPRA) 750 MG  tablet, Take 1,500 mg by mouth 2 (two) times daily., Disp: , Rfl:    NON FORMULARY, CPAP, Disp: , Rfl:    NURTEC 75 MG TBDP, Take 1 tablet by mouth every other day., Disp: , Rfl:    omeprazole (PRILOSEC) 40 MG capsule, Take 1 capsule by mouth once daily., Disp: 30 capsule, Rfl: 3   OXYGEN, Inhale 2 L into the lungs daily as needed., Disp: , Rfl:    potassium chloride SA (KLOR-CON M) 20 MEQ tablet, Take 1 tablet (20 mEq total) by mouth 2 (two) times daily., Disp: 180 tablet, Rfl: 3   promethazine (PHENERGAN) 25 MG tablet, Take 1 tablet (25 mg total) by mouth every 6 (six) hours as needed for nausea or vomiting., Disp: 30 tablet, Rfl: 0   ranolazine (RANEXA) 500 MG 12 hr tablet, Take 1 tablet (500 mg total) by mouth 2 (two) times daily., Disp: 180 tablet, Rfl: 3   rosuvastatin (CRESTOR) 10 MG tablet, Take 1 tablet (10 mg total) by mouth daily., Disp: 90 tablet, Rfl: 3   sertraline (ZOLOFT) 100 MG tablet, Take 200 mg by mouth daily., Disp: , Rfl:    traZODone (DESYREL) 50 MG tablet, TAKE 1/2 TABLET BY MOUTH AT BEDTIME FOR ONE  WEEK, THEN TAKE ONE TABLET AT BEDTIME, Disp: , Rfl:    VENTOLIN HFA 108 (90 Base) MCG/ACT inhaler, INHALE TWO PUFFS INTO THE LUNGS EVERY 6 HOURS AS NEEDED FOR WHEEZING OR SHORTNESS OF BREATH, Disp: 18 g, Rfl: 1   vitamin B-12 (CYANOCOBALAMIN) 1000 MCG tablet, Take 1,000 mcg by mouth daily., Disp: , Rfl:    Vitamin D, Ergocalciferol, 50000 units CAPS, Take 1 capsule by mouth once every 7 days as directed, Disp: 12 capsule, Rfl: 3   benzonatate (TESSALON) 200 MG capsule, Take 1 capsule (200 mg total) by mouth 3 (three) times daily as needed for cough., Disp: 30 capsule, Rfl: 1   nitroGLYCERIN (NITROSTAT) 0.4 MG SL tablet, Place 1 tablet (0.4 mg total) under the tongue every 5 (five) minutes as needed for chest pain. Up to three times., Disp: 45 tablet, Rfl: 3  Allergies  Allergen Reactions   Cephalexin Nausea And Vomiting, Rash and Other (See Comments)   Ubrogepant Swelling and  Other (See Comments)    ROS CONSTITUTIONAL: Negative for chills, fatigue, fever, unintentional weight gain and unintentional weight loss.  E/N/T: Negative for ear pain, nasal congestion and sore throat.  CARDIOVASCULAR: Negative for chest pain, dizziness, palpitations and pedal edema.  RESPIRATORY: see HPI GASTROINTESTINAL: Negative for abdominal pain, acid reflux symptoms, constipation, diarrhea, nausea and vomiting.  MSK: see HPI INTEGUMENTARY: Negative for rash.  NEUROLOGICAL: Negative for dizziness and headaches.       Objective:    PHYSICAL EXAM:   BP 118/70 (BP Location: Right Arm, Patient Position: Sitting, Cuff Size: Large)   Pulse 87   Temp (!) 97.5 F (36.4 C) (Temporal)   Ht 5\' 3"  (1.6 m)   Wt (!) 311 lb 9.6 oz (141.3 kg)   SpO2 93%   BMI 55.20 kg/m    GEN: Well nourished, well developed, in no acute distress  Cardiac: RRR; no murmurs, rubs, or gallops,no edema - Respiratory:  normal respiratory rate and pattern with no distress - normal breath sounds with no rales, rhonchi, wheezes or rubs MS: palpable tenderness to upper back and neck Skin: warm and dry, no rash  Neuro:  Alert and Oriented x 3, - CN II-Xii grossly intact Psych: euthymic mood, appropriate affect and demeanor     Assessment & Plan:    Other chest pain -     EKG 12-Lead Follow up with cardiology as scheduled and sooner if symptoms change or worsen  Acute cough Rx tessalon perles Neck pain Use baclofen as directed and recommend heat/ice therapy to neck/shoulders Other orders -     Benzonatate; Take 1 capsule (200 mg total) by mouth 3 (three) times daily as needed for cough.  Dispense: 30 capsule; Refill: 1 -     Nitroglycerin; Place 1 tablet (0.4 mg total) under the tongue every 5 (five) minutes as needed for chest pain. Up to three times.  Dispense: 45 tablet; Refill: 3     Follow-up: Return if symptoms worsen or fail to improve.  An After Visit Summary was printed and given to the  patient.  Jettie Pagan [facility] [phone]

## 2022-08-17 DIAGNOSIS — G43709 Chronic migraine without aura, not intractable, without status migrainosus: Secondary | ICD-10-CM | POA: Diagnosis not present

## 2022-08-17 DIAGNOSIS — G4733 Obstructive sleep apnea (adult) (pediatric): Secondary | ICD-10-CM | POA: Diagnosis not present

## 2022-08-17 DIAGNOSIS — R569 Unspecified convulsions: Secondary | ICD-10-CM | POA: Diagnosis not present

## 2022-08-17 DIAGNOSIS — F419 Anxiety disorder, unspecified: Secondary | ICD-10-CM | POA: Diagnosis not present

## 2022-08-17 DIAGNOSIS — R531 Weakness: Secondary | ICD-10-CM | POA: Diagnosis not present

## 2022-08-23 ENCOUNTER — Other Ambulatory Visit: Payer: Self-pay | Admitting: Cardiology

## 2022-08-24 ENCOUNTER — Other Ambulatory Visit: Payer: Self-pay

## 2022-08-24 MED ORDER — KETOCONAZOLE 2 % EX SHAM: MEDICATED_SHAMPOO | CUTANEOUS | 1 refills | Status: DC

## 2022-08-30 DIAGNOSIS — R5383 Other fatigue: Secondary | ICD-10-CM | POA: Diagnosis not present

## 2022-08-30 DIAGNOSIS — G4733 Obstructive sleep apnea (adult) (pediatric): Secondary | ICD-10-CM | POA: Diagnosis not present

## 2022-08-30 DIAGNOSIS — J4540 Moderate persistent asthma, uncomplicated: Secondary | ICD-10-CM | POA: Diagnosis not present

## 2022-08-30 DIAGNOSIS — R400 Somnolence: Secondary | ICD-10-CM | POA: Diagnosis not present

## 2022-09-06 ENCOUNTER — Other Ambulatory Visit: Payer: Self-pay | Admitting: Physician Assistant

## 2022-09-06 ENCOUNTER — Encounter: Payer: Self-pay | Admitting: Physician Assistant

## 2022-09-06 DIAGNOSIS — R110 Nausea: Secondary | ICD-10-CM

## 2022-09-06 NOTE — Telephone Encounter (Signed)
Called patient wife, Patient wife stated that patient was not bleeding anymore, and he is still tired. Stated patient did not tell her about the bleeding on Saturday until today but stated this is not the first time it has happen, stated he told her he was bleeding so much he had to change his clothes three times.  Per Marianne Sofia, PA-C:recommend patient go be seen at ER today so labs can be, and follow up with with her after ER visit.   Wife made aware, stated she will let him know don't know if he will go.

## 2022-09-08 DIAGNOSIS — I5032 Chronic diastolic (congestive) heart failure: Secondary | ICD-10-CM | POA: Diagnosis not present

## 2022-09-08 DIAGNOSIS — R0602 Shortness of breath: Secondary | ICD-10-CM | POA: Diagnosis not present

## 2022-09-11 DIAGNOSIS — N3289 Other specified disorders of bladder: Secondary | ICD-10-CM | POA: Diagnosis not present

## 2022-09-11 DIAGNOSIS — C61 Malignant neoplasm of prostate: Secondary | ICD-10-CM | POA: Diagnosis not present

## 2022-09-11 DIAGNOSIS — N529 Male erectile dysfunction, unspecified: Secondary | ICD-10-CM | POA: Diagnosis not present

## 2022-09-11 DIAGNOSIS — N401 Enlarged prostate with lower urinary tract symptoms: Secondary | ICD-10-CM | POA: Diagnosis not present

## 2022-09-20 ENCOUNTER — Ambulatory Visit: Payer: Medicaid Other | Attending: Cardiology | Admitting: Cardiology

## 2022-09-20 ENCOUNTER — Encounter: Payer: Self-pay | Admitting: Cardiology

## 2022-09-20 VITALS — BP 140/78 | HR 82 | Ht 64.0 in | Wt 320.6 lb

## 2022-09-20 DIAGNOSIS — I10 Essential (primary) hypertension: Secondary | ICD-10-CM | POA: Diagnosis not present

## 2022-09-20 DIAGNOSIS — Z6841 Body Mass Index (BMI) 40.0 and over, adult: Secondary | ICD-10-CM

## 2022-09-20 DIAGNOSIS — G459 Transient cerebral ischemic attack, unspecified: Secondary | ICD-10-CM

## 2022-09-20 DIAGNOSIS — I5032 Chronic diastolic (congestive) heart failure: Secondary | ICD-10-CM

## 2022-09-20 DIAGNOSIS — G4739 Other sleep apnea: Secondary | ICD-10-CM

## 2022-09-20 DIAGNOSIS — E782 Mixed hyperlipidemia: Secondary | ICD-10-CM

## 2022-09-20 DIAGNOSIS — I2510 Atherosclerotic heart disease of native coronary artery without angina pectoris: Secondary | ICD-10-CM

## 2022-09-20 NOTE — Patient Instructions (Signed)
Medication Instructions:  Your physician recommends that you continue on your current medications as directed. Please refer to the Current Medication list given to you today.  *If you need a refill on your cardiac medications before your next appointment, please call your pharmacy*   Lab Work: None    Testing/Procedures: None   Follow-Up: At [facility], you and your health needs are our priority.  As part of our continuing mission to provide you with exceptional heart care, we have created designated Provider Care Teams.  These Care Teams include your primary Cardiologist (physician) and Advanced Practice Providers (APPs -  Physician Assistants and Nurse Practitioners) who all work together to provide you with the care you need, when you need it.   Your next appointment:   4 month(s)  Provider:   Kardie Tobb, DO    

## 2022-09-21 ENCOUNTER — Ambulatory Visit: Payer: Medicaid Other | Admitting: Physician Assistant

## 2022-09-21 ENCOUNTER — Encounter: Payer: Self-pay | Admitting: Physician Assistant

## 2022-09-21 VITALS — BP 124/86 | HR 86 | Temp 97.7°F | Ht 64.0 in | Wt 316.0 lb

## 2022-09-21 DIAGNOSIS — K625 Hemorrhage of anus and rectum: Secondary | ICD-10-CM

## 2022-09-21 DIAGNOSIS — K219 Gastro-esophageal reflux disease without esophagitis: Secondary | ICD-10-CM

## 2022-09-21 DIAGNOSIS — Z6841 Body Mass Index (BMI) 40.0 and over, adult: Secondary | ICD-10-CM

## 2022-09-21 DIAGNOSIS — R7303 Prediabetes: Secondary | ICD-10-CM

## 2022-09-21 DIAGNOSIS — C61 Malignant neoplasm of prostate: Secondary | ICD-10-CM | POA: Diagnosis not present

## 2022-09-21 DIAGNOSIS — J4520 Mild intermittent asthma, uncomplicated: Secondary | ICD-10-CM

## 2022-09-21 DIAGNOSIS — E559 Vitamin D deficiency, unspecified: Secondary | ICD-10-CM

## 2022-09-21 DIAGNOSIS — E538 Deficiency of other specified B group vitamins: Secondary | ICD-10-CM

## 2022-09-21 DIAGNOSIS — I10 Essential (primary) hypertension: Secondary | ICD-10-CM

## 2022-09-21 DIAGNOSIS — E782 Mixed hyperlipidemia: Secondary | ICD-10-CM

## 2022-09-21 NOTE — Progress Notes (Signed)
Established Patient Office Visit  Subjective:  Patient ID: Randall Rojas, male    DOB: [birth year]  Age: 62 y.o. MRN: [id number]  CC:  Chief Complaint  Patient presents with   Diabetes   Hyperlipidemia    HPI Randall Rojas presents for follow up of chronic medical issues   Mixed hyperlipidemia  Pt presents with hyperlipidemia. Compliance with treatment has been fair; The patient maintains a low cholesterol diet , follows up as directed ,  The patient denies experiencing any hypercholesterolemia related symptoms. -- he also ha coronary artery disease - follows with Dr Servando Salina (cardiology) regularly Currently on crestor 10mg  every day as well as asa 81mg  every day and ranexa  Pt has history of edema - he takes bumex 2mg  prn swelling along with potassium - states symptoms are stable at this time and having no problems  Pt continues to follow with neurology for history of headaches , history of TIA - he does follow up with them regularly -- provider amy Mariam Dollar NP He is currently taking aimovig, elavil, keppra,   Pt  has anxiety/depression and takes zoloft , elavil and buspar.  He does see a Veterinary surgeon - states symptoms are stable at this tiem  Pt with GERD - pt using omeprazole 40mg  as directed  Pt withVit D def - taking weekly supplement - due for labwork  Pt has vit B12 def - taking otc supplement - due for labwork  Pt was recently diagnosed with prostate cancer - he is following with Dr Saddie Benders - will have next appt in August.  He was recently placed on Urinozinc daily supplement  Pt states that his last colonoscopy was over 3 years ago and was normal.  He has no known history of hemorrhoids.  However he states for several years now he has had issues every few months noting bright red bleeding in bowel movements.  Several weeks ago his wife called our office and said he actually was having profuse rectal bleeding (not just with bms) and advised for them to go to ED for further  evaluation.  He did not go because symptoms subsided.  I recommend referral to GI and will probable need colonscopy for further evaluation Past Medical History:  Diagnosis Date   Acute laryngopharyngitis 11/11/2020   Acute URI 12/12/2019   Anxiety 06/17/2020   Arthralgia 06/17/2020   Asthma    Bilateral leg edema 06/23/2020   Chest pain 04/15/2015   Chronic diastolic heart failure (HCC) 08/05/2020   Chronic heart failure with preserved ejection fraction (HCC) 07/11/2020   Coronary artery calcification seen on CT scan 04/15/2015   Coronary artery disease    Coronary artery disease involving native coronary artery of native heart without angina pectoris 07/11/2020   Coronary artery disease of native artery of native heart with stable angina pectoris (HCC) 06/23/2020   Essential hypertension 04/15/2015   Exertional dyspnea 05/26/2020   Frequent headaches 07/20/2020   GERD (gastroesophageal reflux disease) 05/27/2019   History of CVA in adulthood 08/05/2020   HLD (hyperlipidemia) 07/11/2020   Hyperglycemia 06/06/2015   Hyperlipidemia 04/15/2015   Hypertension 08/05/2020   Hypokalemia 07/20/2020   Left sided numbness 07/25/2020   Leukocytosis 06/06/2015   Malaise 06/17/2020   Mixed hyperlipidemia 04/15/2015   Morbid obesity (HCC) 04/15/2015   Need for prophylactic vaccination against Streptococcus pneumoniae (pneumococcus) 12/03/2019   Need for prophylactic vaccination and inoculation against influenza 12/03/2019   Need for tetanus booster 06/17/2020   Nonepileptic episode (HCC) 10/20/2020  Obesity, Class III, BMI 40-49.9 (morbid obesity) (HCC) 04/15/2015   Other sleep apnea 07/20/2020   Palpitations 08/05/2020   Pneumonia due to COVID-19 virus 04/06/2019   Prediabetes 06/23/2020   Prostate cancer screening 06/02/2019   Rectal bleeding 11/01/2020   Seizure-like activity (HCC) 05/26/2020   Shortness of breath 06/23/2020   TIA (transient ischemic attack)    Upper extremity weakness 05/26/2020   Weakness of left lower  extremity 05/26/2020    Past Surgical History:  Procedure Laterality Date   CARDIAC CATHETERIZATION N/A 04/22/2015   Procedure: Left Heart Cath and Coronary Angiography;  Surgeon: Peter M Swaziland, MD;  Location: [facility] INVASIVE CV LAB;  Service: Cardiovascular;  Laterality: N/A;   GALLBLADDER SURGERY     LEFT HEART CATH AND CORONARY ANGIOGRAPHY N/A 10/28/2020   Procedure: LEFT HEART CATH AND CORONARY ANGIOGRAPHY;  Surgeon: Lennette Bihari, MD;  Location: [facility] INVASIVE CV LAB;  Service: Cardiovascular;  Laterality: N/A;   SINUS SURGERY WITH INSTATRAK      Family History  Problem Relation Age of Onset   Hypertension Mother    Rheum arthritis Mother    Cancer Father    Heart attack Father    Cancer Brother    Hypertension Sister     Social History   Socioeconomic History   Marital status: Married    Spouse name: Not on file   Number of children: Not on file   Years of education: Not on file   Highest education level: Not on file  Occupational History   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  Tobacco Use   Smoking status: Former    Packs/day: 3    Types: Cigarettes    Quit date: 04/13/1989    Years since quitting: 33.4   Smokeless tobacco: Never  Substance and Sexual Activity   Alcohol use: Never   Drug use: Never   Sexual activity: Not Currently  Other Topics Concern   Not on file  Social History Narrative   Epworth Sleepiness Scale = 7 (as of 1/18/207)   Social Determinants of Health   Financial Resource Strain: High Risk (06/17/2021)   Overall Financial Resource Strain (CARDIA)    Difficulty of Paying Living Expenses: Hard  Food Insecurity: Not on file  Transportation Needs: No Transportation Needs (06/17/2021)   PRAPARE - Transportation    Lack of Transportation (Medical): No    Lack of Transportation (Non-Medical): No  Physical Activity: Not on file  Stress: Not on file  Social Connections: Not on file  Intimate Partner Violence: Not on file     Current Outpatient  Medications:    acetaminophen (TYLENOL) 500 MG tablet, Take by mouth., Disp: , Rfl:    amitriptyline (ELAVIL) 50 MG tablet, TAKE ONE TABLET BY MOUTH AT BEDTIME, Disp: 30 tablet, Rfl: 1   baclofen (LIORESAL) 10 MG tablet, Take 10-20 tablets by mouth every 6 (six) hours as needed (pain)., Disp: , Rfl:    benzonatate (TESSALON) 200 MG capsule, Take 1 capsule (200 mg total) by mouth 3 (three) times daily as needed for cough., Disp: 30 capsule, Rfl: 1   blood glucose meter kit and supplies KIT, Dispense based on patient and insurance preference. Use up to four times daily as directed., Disp: 1 each, Rfl: 0   bumetanide (BUMEX) 2 MG tablet, Take 1 tablet (2 mg total) by mouth 2 (two) times daily., Disp: 180 tablet, Rfl: 3   busPIRone (BUSPAR) 10 MG tablet, Take 10 mg by mouth 2 (two) times daily., Disp: ,  Rfl:    GOODSENSE ASPIRIN 81 MG chewable tablet, Chew 81 mg by mouth daily., Disp: , Rfl:    ipratropium-albuterol (DUONEB) 0.5-2.5 (3) MG/3ML SOLN, Inhale into the lungs., Disp: , Rfl:    ketoconazole (NIZORAL) 2 % shampoo, Apply topically 2 (two) times a week., Disp: 120 mL, Rfl: 1   levETIRAcetam (KEPPRA) 750 MG tablet, Take 1,500 mg by mouth 2 (two) times daily., Disp: , Rfl:    Misc Natural Products (URINOZINC PLUS PO), Take by mouth daily., Disp: , Rfl:    montelukast (SINGULAIR) 10 MG tablet, Take 10 mg by mouth daily., Disp: , Rfl:    nitroGLYCERIN (NITROSTAT) 0.4 MG SL tablet, Place 1 tablet (0.4 mg total) under the tongue every 5 (five) minutes as needed for chest pain. Up to three times., Disp: 45 tablet, Rfl: 3   NURTEC 75 MG TBDP, Take 1 tablet by mouth every other day., Disp: , Rfl:    omeprazole (PRILOSEC) 40 MG capsule, Take 1 capsule by mouth once daily., Disp: 30 capsule, Rfl: 3   OXYGEN, Inhale 2 L into the lungs daily as needed., Disp: , Rfl:    potassium chloride SA (KLOR-CON M) 20 MEQ tablet, Take 1 tablet (20 mEq total) by mouth 2 (two) times daily., Disp: 180 tablet, Rfl: 3    promethazine (PHENERGAN) 25 MG tablet, Take 1 tablet (25 mg total) by mouth every 6 (six) hours as needed for nausea or vomiting., Disp: 30 tablet, Rfl: 0   ranolazine (RANEXA) 500 MG 12 hr tablet, Take 1 tablet by mouth twice daily., Disp: 180 tablet, Rfl: 3   rosuvastatin (CRESTOR) 10 MG tablet, TAKE 1 TABLET BY MOUTH ONCE DAILY., Disp: 90 tablet, Rfl: 3   sertraline (ZOLOFT) 100 MG tablet, Take 200 mg by mouth daily., Disp: , Rfl:    traZODone (DESYREL) 50 MG tablet, TAKE 1/2 TABLET BY MOUTH AT BEDTIME FOR ONE WEEK, THEN TAKE ONE TABLET AT BEDTIME, Disp: , Rfl:    VENTOLIN HFA 108 (90 Base) MCG/ACT inhaler, INHALE TWO PUFFS INTO THE LUNGS EVERY 6 HOURS AS NEEDED FOR WHEEZING OR SHORTNESS OF BREATH, Disp: 18 g, Rfl: 1   vitamin B-12 (CYANOCOBALAMIN) 1000 MCG tablet, Take 1,000 mcg by mouth daily., Disp: , Rfl:    Vitamin D, Ergocalciferol, 50000 units CAPS, Take 1 capsule by mouth once every 7 days as directed, Disp: 12 capsule, Rfl: 3   Allergies  Allergen Reactions   Cephalexin Nausea And Vomiting, Rash and Other (See Comments)   Ubrogepant Swelling and Other (See Comments)   CONSTITUTIONAL: has had more fatigue than usual E/N/T: Negative for ear pain, nasal congestion and sore throat.  CARDIOVASCULAR: Negative for chest pain, dizziness, palpitations and pedal edema.  RESPIRATORY: Negative for recent cough and dyspnea.  GASTROINTESTINAL: see HPI MSK: Negative for arthralgias and myalgias.  INTEGUMENTARY: Negative for rash.  NEUROLOGICAL: Negative for dizziness and headaches.  PSYCHIATRIC: Negative for sleep disturbance and to question depression screen.  Negative for depression, negative for anhedonia.       Objective:  PHYSICAL EXAM:   VS: BP 124/86 (BP Location: Left Arm, Patient Position: Sitting)   Pulse 86   Temp 97.7 F (36.5 C) (Temporal)   Ht 5\' 4"  (1.626 m)   Wt (!) 316 lb (143.3 kg)   SpO2 93%   BMI 54.24 kg/m   GEN: Well nourished, well developed, in no acute  distress  Cardiac: RRR; no murmurs, rubs, or gallops,no edema -  Respiratory:  normal respiratory rate  and pattern with no distress - normal breath sounds with no rales, rhonchi, wheezes or rubs GI: normal bowel sounds, no masses or tenderness MS: no deformity or atrophy  Skin: warm and dry, no rash   Psych: euthymic mood, appropriate affect and demeanor     There are no preventive care reminders to display for this patient.  Lab Results  Component Value Date   TSH 2.120 06/14/2022   Lab Results  Component Value Date   WBC 8.7 07/12/2022   HGB 16.0 07/12/2022   HCT 46.5 07/12/2022   MCV 90 07/12/2022   PLT 167 07/12/2022   Lab Results  Component Value Date   NA 143 06/14/2022   K 4.4 06/14/2022   CO2 24 06/14/2022   GLUCOSE 98 06/14/2022   BUN 14 06/14/2022   CREATININE 1.20 06/14/2022   BILITOT 0.7 06/14/2022   ALKPHOS 97 06/14/2022   AST 19 06/14/2022   ALT 27 06/14/2022   PROT 6.3 06/14/2022   ALBUMIN 3.9 06/14/2022   CALCIUM 8.7 06/14/2022   ANIONGAP 11 04/07/2019   EGFR 69 06/14/2022   Lab Results  Component Value Date   CHOL 138 06/14/2022   Lab Results  Component Value Date   HDL 38 (L) 06/14/2022   Lab Results  Component Value Date   LDLCALC 76 06/14/2022   Lab Results  Component Value Date   TRIG 137 06/14/2022   Lab Results  Component Value Date   CHOLHDL 3.6 06/14/2022   Lab Results  Component Value Date   HGBA1C 6.2 (H) 06/14/2022      Assessment & Plan:   Problem List Items Addressed This Visit                                    Mixed hyperlipidemia   Relevant Medications   isosorbide mononitrate (IMDUR) 15 mg TB24 24 hr tablet Continue crestor 10mg  qd   Other Relevant Orders   Lipid panel Continue meds Watch diet    Hx TIA Continue meds and follow up with neurology as directed Continue ASA  GERD Rx for omeprazole as directed   CAD Continue meds as directed Labwork pending Follow up with cardiology as  directed  Anxiety Continue buspar , elavil and zoloft as directed  Vit D def Continue supplement Labwork pending    Prostate cancer Follow up with urology as directed  Rectal bleeding Referral to GI  Morbid obesity BMI 54 Recommend diet changes, increase physical activity Consider Wegovy but would like pt to get GI evaluation first for his rectal bleeding                       No orders of the defined types were placed in this encounter.    Follow-up: Return in about 3 months (around 12/22/2022) for chronic fasting follow-up.    SARA R Shalea Tomczak, PA-C

## 2022-09-22 ENCOUNTER — Other Ambulatory Visit: Payer: Self-pay | Admitting: Physician Assistant

## 2022-09-22 ENCOUNTER — Encounter: Payer: Self-pay | Admitting: Cardiology

## 2022-09-22 DIAGNOSIS — R899 Unspecified abnormal finding in specimens from other organs, systems and tissues: Secondary | ICD-10-CM

## 2022-09-22 LAB — COMPREHENSIVE METABOLIC PANEL
ALT: 40 IU/L (ref 0–44)
AST: 43 IU/L — ABNORMAL HIGH (ref 0–40)
Albumin: 4 g/dL (ref 3.9–4.9)
Alkaline Phosphatase: 106 IU/L (ref 44–121)
BUN/Creatinine Ratio: 10 (ref 10–24)
BUN: 11 mg/dL (ref 8–27)
Bilirubin Total: 0.4 mg/dL (ref 0.0–1.2)
CO2: 27 mmol/L (ref 20–29)
Calcium: 8.9 mg/dL (ref 8.6–10.2)
Chloride: 102 mmol/L (ref 96–106)
Creatinine, Ser: 1.1 mg/dL (ref 0.76–1.27)
Globulin, Total: 2.7 g/dL (ref 1.5–4.5)
Glucose: 98 mg/dL (ref 70–99)
Potassium: 4.5 mmol/L (ref 3.5–5.2)
Sodium: 143 mmol/L (ref 134–144)
Total Protein: 6.7 g/dL (ref 6.0–8.5)
eGFR: 76 mL/min/{1.73_m2} (ref >59)

## 2022-09-22 LAB — CBC WITH DIFFERENTIAL/PLATELET
Basophils Absolute: 0 10*3/uL (ref 0.0–0.2)
Basos: 1 %
EOS (ABSOLUTE): 0.2 10*3/uL (ref 0.0–0.4)
Eos: 3 %
Hematocrit: 45.1 % (ref 37.5–51.0)
Hemoglobin: 15 g/dL (ref 13.0–17.7)
Immature Grans (Abs): 0 10*3/uL (ref 0.0–0.1)
Immature Granulocytes: 1 %
Lymphocytes Absolute: 2.4 10*3/uL (ref 0.7–3.1)
Lymphs: 33 %
MCH: 31.3 pg (ref 26.6–33.0)
MCHC: 33.3 g/dL (ref 31.5–35.7)
MCV: 94 fL (ref 79–97)
Monocytes Absolute: 0.4 10*3/uL (ref 0.1–0.9)
Monocytes: 6 %
Neutrophils Absolute: 4.3 10*3/uL (ref 1.4–7.0)
Neutrophils: 56 %
Platelets: 153 10*3/uL (ref 150–450)
RBC: 4.79 x10E6/uL (ref 4.14–5.80)
RDW: 13.3 % (ref 11.6–15.4)
WBC: 7.4 10*3/uL (ref 3.4–10.8)

## 2022-09-22 LAB — IRON,TIBC AND FERRITIN PANEL
Ferritin: 844 ng/mL — ABNORMAL HIGH (ref 30–400)
Iron Saturation: 27 % (ref 15–55)
Iron: 88 ug/dL (ref 38–169)
Total Iron Binding Capacity: 331 ug/dL (ref 250–450)
UIBC: 243 ug/dL (ref 111–343)

## 2022-09-22 LAB — LIPID PANEL
Chol/HDL Ratio: 3.1 ratio (ref 0.0–5.0)
Cholesterol, Total: 130 mg/dL (ref 100–199)
HDL: 42 mg/dL (ref >39)
LDL Chol Calc (NIH): 63 mg/dL (ref 0–99)
Triglycerides: 146 mg/dL (ref 0–149)
VLDL Cholesterol Cal: 25 mg/dL (ref 5–40)

## 2022-09-22 LAB — B12 AND FOLATE PANEL
Folate: 6.3 ng/mL (ref >3.0)
Vitamin B-12: 1294 pg/mL — ABNORMAL HIGH (ref 232–1245)

## 2022-09-22 LAB — HEMOGLOBIN A1C
Est. average glucose Bld gHb Est-mCnc: 126 mg/dL
Hgb A1c MFr Bld: 6 % — ABNORMAL HIGH (ref 4.8–5.6)

## 2022-09-22 LAB — VITAMIN D 25 HYDROXY (VIT D DEFICIENCY, FRACTURES): Vit D, 25-Hydroxy: 49 ng/mL (ref 30.0–100.0)

## 2022-09-22 NOTE — Progress Notes (Signed)
Cardiology Office Note:    Date:  09/22/2022   ID:  Randall Rojas, DOB 1961-01-18, MRN [id number]  PCP:  Marianne Sofia, PA-C  Cardiologist:  Thomasene Ripple, DO  Electrophysiologist:  None   Referring MD: Marianne Sofia, PA-C   " I am ok"  History of Present Illness:    Randall Rojas is a 62 y.o. male with a hx of  coronary artery disease, TIA , hypertension, hyperlipidemia, chronic diastolic heart failure with his recent EF in May 2022 55-60%, morbid obesity, sever OSA on cpap.   Here today for a follow up visit. He offers no specific complaints at this time.     Past Medical History:  Diagnosis Date   Acute laryngopharyngitis 11/11/2020   Acute URI 12/12/2019   Anxiety 06/17/2020   Arthralgia 06/17/2020   Asthma    Bilateral leg edema 06/23/2020   Chest pain 04/15/2015   Chronic diastolic heart failure (HCC) 08/05/2020   Chronic heart failure with preserved ejection fraction (HCC) 07/11/2020   Coronary artery calcification seen on CT scan 04/15/2015   Coronary artery disease    Coronary artery disease involving native coronary artery of native heart without angina pectoris 07/11/2020   Coronary artery disease of native artery of native heart with stable angina pectoris (HCC) 06/23/2020   Essential hypertension 04/15/2015   Exertional dyspnea 05/26/2020   Frequent headaches 07/20/2020   GERD (gastroesophageal reflux disease) 05/27/2019   History of CVA in adulthood 08/05/2020   HLD (hyperlipidemia) 07/11/2020   Hyperglycemia 06/06/2015   Hyperlipidemia 04/15/2015   Hypertension 08/05/2020   Hypokalemia 07/20/2020   Left sided numbness 07/25/2020   Leukocytosis 06/06/2015   Malaise 06/17/2020   Mixed hyperlipidemia 04/15/2015   Morbid obesity (HCC) 04/15/2015   Need for prophylactic vaccination against Streptococcus pneumoniae (pneumococcus) 12/03/2019   Need for prophylactic vaccination and inoculation against influenza 12/03/2019   Need for tetanus booster 06/17/2020   Nonepileptic episode (HCC)  10/20/2020   Obesity, Class III, BMI 40-49.9 (morbid obesity) (HCC) 04/15/2015   Other sleep apnea 07/20/2020   Palpitations 08/05/2020   Pneumonia due to COVID-19 virus 04/06/2019   Prediabetes 06/23/2020   Prostate cancer screening 06/02/2019   Rectal bleeding 11/01/2020   Seizure-like activity (HCC) 05/26/2020   Shortness of breath 06/23/2020   TIA (transient ischemic attack)    Upper extremity weakness 05/26/2020   Weakness of left lower extremity 05/26/2020    Past Surgical History:  Procedure Laterality Date   CARDIAC CATHETERIZATION N/A 04/22/2015   Procedure: Left Heart Cath and Coronary Angiography;  Surgeon: Peter M Swaziland, MD;  Location: [facility] INVASIVE CV LAB;  Service: Cardiovascular;  Laterality: N/A;   GALLBLADDER SURGERY     LEFT HEART CATH AND CORONARY ANGIOGRAPHY N/A 10/28/2020   Procedure: LEFT HEART CATH AND CORONARY ANGIOGRAPHY;  Surgeon: Lennette Bihari, MD;  Location: [facility] INVASIVE CV LAB;  Service: Cardiovascular;  Laterality: N/A;   SINUS SURGERY WITH INSTATRAK      Current Medications: Current Meds  Medication Sig   acetaminophen (TYLENOL) 500 MG tablet Take by mouth.   amitriptyline (ELAVIL) 50 MG tablet TAKE ONE TABLET BY MOUTH AT BEDTIME   baclofen (LIORESAL) 10 MG tablet Take 10-20 tablets by mouth every 6 (six) hours as needed (pain).   benzonatate (TESSALON) 200 MG capsule Take 1 capsule (200 mg total) by mouth 3 (three) times daily as needed for cough.   blood glucose meter kit and supplies KIT Dispense based on patient and insurance preference. Use up  to four times daily as directed.   bumetanide (BUMEX) 2 MG tablet Take 1 tablet (2 mg total) by mouth 2 (two) times daily.   busPIRone (BUSPAR) 10 MG tablet Take 10 mg by mouth 2 (two) times daily.   GOODSENSE ASPIRIN 81 MG chewable tablet Chew 81 mg by mouth daily.   ipratropium-albuterol (DUONEB) 0.5-2.5 (3) MG/3ML SOLN Inhale into the lungs.   ketoconazole (NIZORAL) 2 % shampoo Apply topically 2 (two) times a week.    levETIRAcetam (KEPPRA) 750 MG tablet Take 1,500 mg by mouth 2 (two) times daily.   Misc Natural Products (URINOZINC PLUS PO) Take by mouth daily.   montelukast (SINGULAIR) 10 MG tablet Take 10 mg by mouth daily.   nitroGLYCERIN (NITROSTAT) 0.4 MG SL tablet Place 1 tablet (0.4 mg total) under the tongue every 5 (five) minutes as needed for chest pain. Up to three times.   NURTEC 75 MG TBDP Take 1 tablet by mouth every other day.   omeprazole (PRILOSEC) 40 MG capsule Take 1 capsule by mouth once daily.   OXYGEN Inhale 2 L into the lungs daily as needed.   potassium chloride SA (KLOR-CON M) 20 MEQ tablet Take 1 tablet (20 mEq total) by mouth 2 (two) times daily.   promethazine (PHENERGAN) 25 MG tablet Take 1 tablet (25 mg total) by mouth every 6 (six) hours as needed for nausea or vomiting.   ranolazine (RANEXA) 500 MG 12 hr tablet Take 1 tablet by mouth twice daily.   rosuvastatin (CRESTOR) 10 MG tablet TAKE 1 TABLET BY MOUTH ONCE DAILY.   sertraline (ZOLOFT) 100 MG tablet Take 200 mg by mouth daily.   traZODone (DESYREL) 50 MG tablet TAKE 1/2 TABLET BY MOUTH AT BEDTIME FOR ONE WEEK, THEN TAKE ONE TABLET AT BEDTIME   VENTOLIN HFA 108 (90 Base) MCG/ACT inhaler INHALE TWO PUFFS INTO THE LUNGS EVERY 6 HOURS AS NEEDED FOR WHEEZING OR SHORTNESS OF BREATH   vitamin B-12 (CYANOCOBALAMIN) 1000 MCG tablet Take 1,000 mcg by mouth daily.   Vitamin D, Ergocalciferol, 50000 units CAPS Take 1 capsule by mouth once every 7 days as directed   [DISCONTINUED] B-12, Methylcobalamin, 1000 MCG SUBL Take 1 tablet by mouth daily.   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  Tobacco Use   Smoking status: Former     Packs/day: 3    Types: Cigarettes    Quit date: 04/13/1989    Years since quitting: 33.4   Smokeless tobacco: Never  Substance and Sexual Activity   Alcohol use: Never   Drug use: Never   Sexual activity: Not Currently  Other Topics Concern   Not on file  Social History Narrative   Epworth Sleepiness Scale = 7 (as of 1/18/207)   Social Determinants of Health   Financial Resource Strain: High Risk (06/17/2021)   Overall Financial Resource Strain (CARDIA)    Difficulty of Paying Living Expenses: Hard  Food Insecurity: Not on file  Transportation Needs: No Transportation Needs (06/17/2021)   PRAPARE - Transportation    Lack of Transportation (Medical): No    Lack of Transportation (Non-Medical): No  Physical Activity: Not on file  Stress: Not on file  Social Connections: Not on file     Family History: The patient's family history includes Cancer in his brother and father; Heart attack in his father; Hypertension in his mother and sister; Rheum arthritis in his mother.  ROS:   Review of Systems  Constitution: Negative for decreased appetite, fever and weight gain.  HENT: Negative for congestion, ear discharge, hoarse voice and sore throat.   Eyes: Negative for discharge, redness, vision loss in right eye and visual halos.  Cardiovascular: Negative for chest pain, dyspnea on exertion, leg swelling, orthopnea and palpitations.  Respiratory: Negative for cough, hemoptysis, shortness of breath and snoring.   Endocrine: Negative for heat intolerance and polyphagia.  Hematologic/Lymphatic: Negative for bleeding problem. Does not bruise/bleed easily.  Skin: Negative for flushing, nail changes, rash and suspicious lesions.  Musculoskeletal: Negative for arthritis, joint pain, muscle cramps, myalgias, neck pain and stiffness.  Gastrointestinal: Negative for abdominal pain, bowel incontinence, diarrhea and excessive appetite.  Genitourinary: Negative for decreased libido, genital sores  and incomplete emptying.  Neurological: Negative for brief paralysis, focal weakness, headaches and loss of balance.  Psychiatric/Behavioral: Negative for altered mental status, depression and suicidal ideas.  Allergic/Immunologic: Negative for HIV exposure and persistent infections.    EKGs/Labs/Other Studies Reviewed:    The following studies were reviewed today:   EKG:  The ekg ordered today demonstrates sinus rhythm HR 76 bpm.  Left heart catheterization October 28, 2020 1st Diag lesion is 40% stenosed.   Mid LAD lesion is 10% stenosed.   The left ventricular systolic function is normal.   LV end diastolic pressure is mildly elevated.   The left ventricular ejection fraction is 50-55% by visual estimate.  Mild nonobstructive CAD with 40% proximal stenosis in the first diagonal branch of the LAD with mild smooth luminal 10% mid LAD narrowing; normal left circumflex coronary artery; normal dominant RCA.   Low normal LV function with EF estimated 50 to 55% without focal segmental wall motion abnormality.  LVEDP 21 mm.   RECOMMENDATION: Medical therapy for mild nonobstructive CAD.  Aggressive lipid-lowering therapy with target LDL less than 70.    ZIO monitor Aug 13, 2020 Patch Wear Time:  3 days and 10 hours starting Aug 05, 2020 Indication: Palpitations   Patient had a min HR of 51 bpm, max HR of 120 bpm, and avg HR of 83 bpm.    Predominant underlying rhythm was Sinus Rhythm.   Premature ventricular complexes were rare.   Premature atrial complex were rare.   No ventricular tachycardia, no pauses, no supraventricular tachycardia no atrial fibrillation noted.   Symptoms associated with sinus rhythm.   Conclusion: Normal/unremarkable study with no evidence of significant arrhythmia  Recent Labs: 06/14/2022: TSH 2.120 09/21/2022: ALT 40; BUN 11; Creatinine, Ser 1.10; Hemoglobin 15.0; Platelets 153; Potassium 4.5; Sodium 143  Recent Lipid Panel    Component Value Date/Time    CHOL 130 09/21/2022 0957   TRIG 146 09/21/2022 0957   HDL 42 09/21/2022 0957   CHOLHDL 3.1 09/21/2022 0957   CHOLHDL 6.2 04/07/2019 0334   VLDL 16 04/07/2019 0334   LDLCALC 63 09/21/2022 0957    Physical Exam:    VS:  BP (!) 140/78   Pulse 82   Ht 5\' 4"  (1.626 m)   Wt (!) 320 lb 9.6 oz (145.4 kg)   SpO2 94%   BMI 55.03 kg/m     Wt Readings from Last 3 Encounters:  09/21/22 (!) 316 lb (143.3 kg)  09/20/22 (!) 320 lb 9.6 oz (145.4 kg)  08/16/22 (!) 311 lb 9.6 oz (141.3 kg)     GEN: Well nourished, well developed in no acute distress HEENT: Normal NECK: No JVD; No carotid bruits LYMPHATICS: No lymphadenopathy CARDIAC: S1S2 noted,RRR, no murmurs, rubs, gallops RESPIRATORY:  Clear to auscultation without rales, wheezing or rhonchi  ABDOMEN: Soft, non-tender, non-distended, +bowel sounds, no guarding. EXTREMITIES: No edema, No cyanosis, no clubbing MUSCULOSKELETAL:  No deformity  SKIN: Warm and dry NEUROLOGIC:  Alert and oriented x 3, non-focal PSYCHIATRIC:  Normal affect, good insight  ASSESSMENT:    1. TIA (transient ischemic attack)   2. Essential hypertension   3. Coronary artery disease involving native coronary artery of native heart without angina pectoris   4. Chronic heart failure with preserved ejection fraction (HCC)   5. Hypertension, unspecified type   6. Other sleep apnea   7. Mixed hyperlipidemia   8. Morbid obesity with BMI of 50.0-59.9, adult (HCC)      PLAN:    No medication changes will be done today.   CAD - stable. Cont current regimen with aspirin, statin and antianginals.  HTN - isolated elevated blood pressures today - asked the patient to take bp daily and send information in 2 weeks. HLN - continue statin, fu lipid profile LDL goal <55. Chronic diastolic heart failure - euvolemic OSA - cont cpap  The patient is in agreement with the above plan. The patient left the office in stable condition.  The patient will follow up in     Medication Adjustments/Labs and Tests Ordered: Current medicines are reviewed at length with the patient today.  Concerns regarding medicines are outlined above.  No orders of the defined types were placed in this encounter.  No orders of the defined types were placed in this encounter.   Patient Instructions  Medication Instructions:  Your physician recommends that you continue on your current medications as directed. Please refer to the Current Medication list given to you today.  *If you need a refill on your cardiac medications before your next appointment, please call your pharmacy*   Lab Work: None   Testing/Procedures: None   Follow-Up: At [facility], you and your health needs are our priority.  As part of our continuing mission to provide you with exceptional heart care, we have created designated Provider Care Teams.  These Care Teams include your primary Cardiologist (physician) and Advanced Practice Providers (APPs -  Physician Assistants and Nurse Practitioners) who all work together to provide you with the care you need, when you need it.    Your next appointment:   4 month(s)  Provider:   Thomasene Ripple, DO     Adopting a Healthy Lifestyle.  Know what a healthy weight is for you (roughly BMI <25) and aim to maintain this   Aim for 7+ servings of fruits and vegetables daily   65-80+ fluid ounces of water or unsweet tea for healthy kidneys   Limit to max 1 drink of alcohol per day; avoid smoking/tobacco   Limit animal fats in diet for cholesterol and heart health - choose grass fed whenever available   Avoid highly processed foods, and foods high in saturated/trans fats   Aim for low stress - take time to unwind and care for your mental health   Aim for 150 min of moderate intensity exercise weekly for heart health, and weights twice weekly for bone health   Aim for 7-9 hours of sleep daily  When it comes to diets, agreement about the  perfect plan isnt easy to find, even among the experts. Experts at the [facility] [employer] developed an idea known as the Healthy Eating Plate. Just imagine a plate divided into logical, healthy portions.   The emphasis is on diet quality:   Load up on vegetables and fruits - one-half of your plate: Aim for color and variety, and remember that potatoes dont count.   Go for whole grains - one-quarter of your plate: Whole wheat, barley, wheat berries, quinoa, oats, brown rice, and foods made with them. If you want pasta, go with whole wheat pasta.   Protein power - one-quarter of your plate: Fish, chicken, beans, and nuts are all healthy, versatile protein sources. Limit red meat.   The diet, however, does go beyond the plate, offering a few other suggestions.   Use healthy plant oils, such as olive, canola, soy, corn, sunflower and peanut. Check the labels, and avoid partially hydrogenated oil, which have unhealthy trans fats.   If youre thirsty, drink water. Coffee and tea are good in moderation, but skip sugary drinks and limit milk and dairy products to one or two daily servings.   The type of carbohydrate in the diet is more important than the amount. Some sources of carbohydrates, such as vegetables, fruits, whole grains, and beans-are healthier than others.   Finally, stay active  Signed, Thomasene Ripple, DO  09/22/2022 7:50 PM    [facility]

## 2022-10-02 ENCOUNTER — Encounter: Payer: Self-pay | Admitting: Cardiology

## 2022-10-03 ENCOUNTER — Other Ambulatory Visit: Payer: Self-pay | Admitting: Family Medicine

## 2022-10-12 ENCOUNTER — Other Ambulatory Visit: Payer: Medicaid Other

## 2022-10-12 DIAGNOSIS — R899 Unspecified abnormal finding in specimens from other organs, systems and tissues: Secondary | ICD-10-CM

## 2022-10-13 ENCOUNTER — Other Ambulatory Visit: Payer: Self-pay | Admitting: Physician Assistant

## 2022-10-13 DIAGNOSIS — R7989 Other specified abnormal findings of blood chemistry: Secondary | ICD-10-CM

## 2022-10-13 LAB — ACUTE HEP PANEL AND HEP B SURFACE AB
Hep A IgM: NEGATIVE
Hep B C IgM: NEGATIVE
Hep C Virus Ab: NONREACTIVE
Hepatitis B Surf Ab Quant: <3.5 m[IU]/mL — ABNORMAL LOW
Hepatitis B Surface Ag: NEGATIVE

## 2022-10-13 LAB — CBC WITH DIFFERENTIAL/PLATELET
Basophils Absolute: 0.1 10*3/uL (ref 0.0–0.2)
Basos: 1 %
EOS (ABSOLUTE): 0.2 10*3/uL (ref 0.0–0.4)
Eos: 4 %
Hematocrit: 44.5 % (ref 37.5–51.0)
Hemoglobin: 14.7 g/dL (ref 13.0–17.7)
Immature Grans (Abs): 0 10*3/uL (ref 0.0–0.1)
Immature Granulocytes: 1 %
Lymphocytes Absolute: 2.6 10*3/uL (ref 0.7–3.1)
Lymphs: 38 %
MCH: 31.3 pg (ref 26.6–33.0)
MCHC: 33 g/dL (ref 31.5–35.7)
MCV: 95 fL (ref 79–97)
Monocytes Absolute: 0.4 10*3/uL (ref 0.1–0.9)
Monocytes: 6 %
Neutrophils Absolute: 3.4 10*3/uL (ref 1.4–7.0)
Neutrophils: 50 %
Platelets: 139 10*3/uL — ABNORMAL LOW (ref 150–450)
RBC: 4.7 x10E6/uL (ref 4.14–5.80)
RDW: 13.4 % (ref 11.6–15.4)
WBC: 6.7 10*3/uL (ref 3.4–10.8)

## 2022-10-13 LAB — COMPREHENSIVE METABOLIC PANEL
ALT: 40 IU/L (ref 0–44)
AST: 36 IU/L (ref 0–40)
Albumin: 3.8 g/dL — ABNORMAL LOW (ref 3.9–4.9)
Alkaline Phosphatase: 124 IU/L — ABNORMAL HIGH (ref 44–121)
BUN/Creatinine Ratio: 8 — ABNORMAL LOW (ref 10–24)
BUN: 8 mg/dL (ref 8–27)
Bilirubin Total: 0.4 mg/dL (ref 0.0–1.2)
CO2: 25 mmol/L (ref 20–29)
Calcium: 8.9 mg/dL (ref 8.6–10.2)
Chloride: 102 mmol/L (ref 96–106)
Creatinine, Ser: 0.95 mg/dL (ref 0.76–1.27)
Globulin, Total: 2.6 g/dL (ref 1.5–4.5)
Glucose: 124 mg/dL — ABNORMAL HIGH (ref 70–99)
Potassium: 4.4 mmol/L (ref 3.5–5.2)
Sodium: 141 mmol/L (ref 134–144)
Total Protein: 6.4 g/dL (ref 6.0–8.5)
eGFR: 91 mL/min/{1.73_m2} (ref >59)

## 2022-10-13 LAB — IRON,TIBC AND FERRITIN PANEL
Ferritin: 717 ng/mL — ABNORMAL HIGH (ref 30–400)
Iron Saturation: 26 % (ref 15–55)
Iron: 83 ug/dL (ref 38–169)
Total Iron Binding Capacity: 325 ug/dL (ref 250–450)
UIBC: 242 ug/dL (ref 111–343)

## 2022-10-14 ENCOUNTER — Encounter: Payer: Self-pay | Admitting: Physician Assistant

## 2022-10-16 ENCOUNTER — Telehealth: Payer: Self-pay

## 2022-10-16 NOTE — Telephone Encounter (Signed)
Patient was informed of his lab results last week and the wife called back today stating that she thinks that the patient mis understood what was being said and she states that the patient has the series of Hep B shots as a kid . She states that he has had them before and wanted to let you know.

## 2022-10-18 NOTE — Telephone Encounter (Signed)
Patient informed, and other nurse had already reached out to patient letting him know that since he has medicaid that it would have to be given by the health department.

## 2022-10-19 ENCOUNTER — Other Ambulatory Visit: Payer: Self-pay | Admitting: Oncology

## 2022-10-19 ENCOUNTER — Encounter: Payer: Self-pay | Admitting: Physician Assistant

## 2022-10-19 DIAGNOSIS — R7989 Other specified abnormal findings of blood chemistry: Secondary | ICD-10-CM

## 2022-10-19 NOTE — Telephone Encounter (Signed)
Called patient wife, she stated patient is laying down right now but he is able to move around just in enough pain where it is uncombable, stated it started yesterday and he pass some blood on Sunday just a little haven't pass any more.   Per Marianne Sofia, PA-C: recommends patient go to the ED to be check out, due to he needs a CT of the ABD.  Wife made aware stated she would try to get him to go.

## 2022-10-19 NOTE — Progress Notes (Signed)
[facility]  [address] [place],  [place]  [address] [phone]  Clinic Day:  10/20/2022  Referring physician: Marianne Sofia, PA-C   HISTORY OF PRESENT ILLNESS:  The patient is a 62 y.o. male  who I was asked to consult upon for an elevated ferritin level.  Labs in June 2024 showed an elevated ferritin of 844.  When checked earlier this month, it was somewhat lower at 717.  However, the rest of his iron panel was essentially normal.  The patient denies there being a family history of hemochromatosis or other blood disorders.  Of note, this gentleman does have multiple comorbidities, including congestive heart failure cerebrovascular disease, coronary artery disease, and prostate cancer.  PAST MEDICAL HISTORY:   Past Medical History:  Diagnosis Date   Acute laryngopharyngitis 11/11/2020   Acute URI 12/12/2019   Anxiety 06/17/2020   Arthralgia 06/17/2020   Asthma    Bilateral leg edema 06/23/2020   Chest pain 04/15/2015   Chronic diastolic heart failure (HCC) 08/05/2020   Chronic heart failure with preserved ejection fraction (HCC) 07/11/2020   Coronary artery calcification seen on CT scan 04/15/2015   Coronary artery disease    Coronary artery disease involving native coronary artery of native heart without angina pectoris 07/11/2020   Coronary artery disease of native artery of native heart with stable angina pectoris (HCC) 06/23/2020   Essential hypertension 04/15/2015   Exertional dyspnea 05/26/2020   Frequent headaches 07/20/2020   GERD (gastroesophageal reflux disease) 05/27/2019   History of CVA in adulthood 08/05/2020   HLD (hyperlipidemia) 07/11/2020   Hyperglycemia 06/06/2015   Hyperlipidemia 04/15/2015   Hypertension 08/05/2020   Hypokalemia 07/20/2020   Left sided numbness 07/25/2020   Leukocytosis 06/06/2015   Malaise 06/17/2020   Mixed hyperlipidemia 04/15/2015   Morbid obesity (HCC) 04/15/2015   Need for prophylactic vaccination against Streptococcus  pneumoniae (pneumococcus) 12/03/2019   Need for prophylactic vaccination and inoculation against influenza 12/03/2019   Need for tetanus booster 06/17/2020   Nonepileptic episode (HCC) 10/20/2020   Obesity, Class III, BMI 40-49.9 (morbid obesity) (HCC) 04/15/2015   Other sleep apnea 07/20/2020   Palpitations 08/05/2020   Pneumonia due to COVID-19 virus 04/06/2019   Prediabetes 06/23/2020   Prostate cancer screening 06/02/2019   Rectal bleeding 11/01/2020   Seizure-like activity (HCC) 05/26/2020   Shortness of breath 06/23/2020   TIA (transient ischemic attack)    Upper extremity weakness 05/26/2020   Weakness of left lower extremity 05/26/2020    PAST SURGICAL HISTORY:   Past Surgical History:  Procedure Laterality Date   CARDIAC CATHETERIZATION N/A 04/22/2015   Procedure: Left Heart Cath and Coronary Angiography;  Surgeon: Peter M Swaziland, MD;  Location: [facility] INVASIVE CV LAB;  Service: Cardiovascular;  Laterality: N/A;   GALLBLADDER SURGERY     LEFT HEART CATH AND CORONARY ANGIOGRAPHY N/A 10/28/2020   Procedure: LEFT HEART CATH AND CORONARY ANGIOGRAPHY;  Surgeon: Lennette Bihari, MD;  Location: [facility] INVASIVE CV LAB;  Service: Cardiovascular;  Laterality: N/A;   SINUS SURGERY WITH INSTATRAK      CURRENT MEDICATIONS:   Current Outpatient Medications  Medication Sig Dispense Refill   acetaminophen (TYLENOL) 500 MG tablet Take by mouth.     amitriptyline (ELAVIL) 50 MG tablet TAKE ONE TABLET BY MOUTH AT BEDTIME 30 tablet 1   baclofen (LIORESAL) 10 MG tablet Take 10-20 tablets by mouth every 6 (six) hours as needed (pain).     benzonatate (TESSALON) 200 MG capsule Take 1  capsule (200 mg total) by mouth 3 (three) times daily as needed for cough. 30 capsule 1   blood glucose meter kit and supplies KIT Dispense based on patient and insurance preference. Use up to four times daily as directed. 1 each 0   bumetanide (BUMEX) 2 MG tablet Take 1 tablet (2 mg total) by mouth 2 (two) times daily. 180 tablet 3    busPIRone (BUSPAR) 10 MG tablet Take 10 mg by mouth 2 (two) times daily.     GOODSENSE ASPIRIN 81 MG chewable tablet Chew 81 mg by mouth daily.     ipratropium-albuterol (DUONEB) 0.5-2.5 (3) MG/3ML SOLN Inhale into the lungs.     ketoconazole (NIZORAL) 2 % shampoo Apply topically 2 (two) times a week. 120 mL 1   levETIRAcetam (KEPPRA) 750 MG tablet Take 1,500 mg by mouth 2 (two) times daily.     Misc Natural Products (URINOZINC PLUS PO) Take by mouth daily.     montelukast (SINGULAIR) 10 MG tablet Take 10 mg by mouth daily.     nitroGLYCERIN (NITROSTAT) 0.4 MG SL tablet Place 1 tablet (0.4 mg total) under the tongue every 5 (five) minutes as needed for chest pain. Up to three times. 45 tablet 3   NURTEC 75 MG TBDP Take 1 tablet by mouth every other day.     omeprazole (PRILOSEC) 40 MG capsule Take 1 capsule by mouth once daily. 30 capsule 3   OXYGEN Inhale 2 L into the lungs daily as needed.     potassium chloride SA (KLOR-CON M) 20 MEQ tablet Take 1 tablet (20 mEq total) by mouth 2 (two) times daily. 180 tablet 3   promethazine (PHENERGAN) 25 MG tablet Take 1 tablet (25 mg total) by mouth every 6 (six) hours as needed for nausea or vomiting. 30 tablet 0   ranolazine (RANEXA) 500 MG 12 hr tablet Take 1 tablet by mouth twice daily. 180 tablet 3   rosuvastatin (CRESTOR) 10 MG tablet TAKE 1 TABLET BY MOUTH ONCE DAILY. 90 tablet 3   sertraline (ZOLOFT) 100 MG tablet Take 200 mg by mouth daily.     traZODone (DESYREL) 50 MG tablet TAKE 1/2 TABLET BY MOUTH AT BEDTIME FOR ONE WEEK, THEN TAKE ONE TABLET AT BEDTIME     VENTOLIN HFA 108 (90 Base) MCG/ACT inhaler INHALE TWO PUFFS INTO THE LUNGS EVERY 6 HOURS AS NEEDED FOR WHEEZING OR SHORTNESS OF BREATH 18 g 1   vitamin B-12 (CYANOCOBALAMIN) 1000 MCG tablet Take 1,000 mcg by mouth daily.     Vitamin D, Ergocalciferol, 50000 units CAPS Take 1 capsule by mouth once every 7 days as directed 12 capsule 3   No current facility-administered medications for this  visit.    ALLERGIES:   Allergies  Allergen Reactions   Cephalexin Nausea And Vomiting, Rash and Other (See Comments)   Ubrogepant Swelling and Other (See Comments)    FAMILY HISTORY:   Family History  Problem Relation Age of Onset   Hypertension Mother    Rheum arthritis Mother    Heart attack Father    Prostate cancer Father        METS TO BRAIN; H/O SKIN CANCER   Hypertension Sister    Heart disease Sister    Heart attack Sister    Hypertension Sister    Prostate cancer Brother        METS TO BONE AND LIVER   Breast cancer Paternal Aunt    Prostate cancer Paternal Uncle  METS TO LYMPH NODES   Cancer Paternal Uncle        UNKNOWN PRIMARY ORAL MALIGNANCY   Breast cancer Cousin     SOCIAL HISTORY:  The patient was born and raised in [place].  He lives in the [place] community with his wife of 38 years.  They have 3 children and 9 grandchildren.  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  The patient did smoke as much as 3 packs of cigarettes daily for 15 years before quitting approximately 15 years ago.  There is no history of alcohol abuse.  REVIEW OF SYSTEMS:  Review of Systems  Constitutional:  Negative for fatigue, fever and unexpected weight change.  HENT:   Positive for hearing loss.   Respiratory:  Positive for shortness of breath. Negative for chest tightness, cough and hemoptysis.   Cardiovascular:  Positive for chest pain. Negative for palpitations.  Gastrointestinal:  Positive for blood in stool. Negative for abdominal distention, abdominal pain, constipation, diarrhea, nausea and vomiting.  Genitourinary:  Negative for dysuria, frequency and hematuria.   Musculoskeletal:  Positive for arthralgias and myalgias. Negative for back pain.  Skin:  Negative for itching and rash.  Neurological:  Positive for headaches. Negative for dizziness and light-headedness.  Psychiatric/Behavioral:  Positive for  depression. Negative for suicidal ideas. The patient is nervous/anxious.      PHYSICAL EXAM:  Blood pressure (!) 158/83, pulse 98, temperature 98.7 F (37.1 C), resp. rate 18, height 5\' 4"  (1.626 m), weight (!) 316 lb (143.3 kg), SpO2 91%. Wt Readings from Last 3 Encounters:  10/20/22 (!) 316 lb (143.3 kg)  09/21/22 (!) 316 lb (143.3 kg)  09/20/22 (!) 320 lb 9.6 oz (145.4 kg)   Body mass index is 54.24 kg/m. Performance status (ECOG): 1 - Symptomatic but completely ambulatory Physical Exam Constitutional:      Appearance: Normal appearance. He is not ill-appearing.  HENT:     Mouth/Throat:     Mouth: Mucous membranes are moist.     Pharynx: Oropharynx is clear. No oropharyngeal exudate or posterior oropharyngeal erythema.  Cardiovascular:     Rate and Rhythm: Normal rate and regular rhythm.     Heart sounds: No murmur heard.    No friction rub. No gallop.  Pulmonary:     Effort: Pulmonary effort is normal. No respiratory distress.     Breath sounds: Normal breath sounds. No wheezing, rhonchi or rales.  Abdominal:     General: Bowel sounds are normal. There is no distension.     Palpations: Abdomen is soft. There is no mass.     Tenderness: There is no abdominal tenderness.  Musculoskeletal:        General: No swelling.     Right lower leg: No edema.     Left lower leg: No edema.  Lymphadenopathy:     Cervical: No cervical adenopathy.     Upper Body:     Right upper body: No supraclavicular or axillary adenopathy.     Left upper body: No supraclavicular or axillary adenopathy.     Lower Body: No right inguinal adenopathy. No left inguinal adenopathy.  Skin:    General: Skin is warm.     Coloration: Skin is not jaundiced.     Findings: No lesion or rash.  Neurological:     General: No focal deficit present.     Mental Status: He is alert and oriented to person, place, and  time. Mental status is at baseline.  Psychiatric:        Mood and Affect: Mood normal.         Behavior: Behavior normal.        Thought Content: Thought content normal.     LABS:      Latest Ref Rng & Units 10/12/2022   10:02 AM 09/21/2022    9:57 AM 07/12/2022    1:50 PM  CBC  WBC 3.4 - 10.8 x10E3/uL 6.7  7.4  8.7   Hemoglobin 13.0 - 17.7 g/dL 16.1  09.6  04.5   Hematocrit 37.5 - 51.0 % 44.5  45.1  46.5   Platelets 150 - 450 x10E3/uL 139  153  167       Latest Ref Rng & Units 10/12/2022   10:02 AM 09/21/2022    9:57 AM 06/14/2022    8:48 AM  CMP  Glucose 70 - 99 mg/dL 409  98  98   BUN 8 - 27 mg/dL 8  11  14    Creatinine 0.76 - 1.27 mg/dL 8.11  9.14  7.82   Sodium 134 - 144 mmol/L 141  143  143   Potassium 3.5 - 5.2 mmol/L 4.4  4.5  4.4   Chloride 96 - 106 mmol/L 102  102  104   CO2 20 - 29 mmol/L 25  27  24    Calcium 8.6 - 10.2 mg/dL 8.9  8.9  8.7   Total Protein 6.0 - 8.5 g/dL 6.4  6.7  6.3   Total Bilirubin 0.0 - 1.2 mg/dL 0.4  0.4  0.7   Alkaline Phos 44 - 121 IU/L 124  106  97   AST 0 - 40 IU/L 36  43  19   ALT 0 - 44 IU/L 40  40  27     Latest Reference Range & Units 10/12/22 10:02  Iron 38 - 169 ug/dL 83  UIBC 956 - 213 ug/dL 086  TIBC 578 - 469 ug/dL 629  Ferritin 30 - 528 ng/mL 717 (H)  Iron Saturation 15 - 55 % 26  (H): Data is abnormally high   ASSESSMENT & PLAN:  A 62 y.o. male who I was asked to consult upon for a persistently elevated ferritin level.  Usually, when this is present, the concern for hemochromatosis exists.  However, when looking at his recent iron panels, his ferritin level was only iron parameter that was elevated.  Usually, when hemochromatosis is present, ferritin, serum iron, and iron saturation levels tend to all be elevated.  The fact that only his ferritin is elevated suggests this may be due to it being an acute phase reactant.  As mentioned previously, this gentleman does have numerous medical issues which could definitely be factoring into his elevated ferritin level.  For completeness, I will check his hemochromatosis mutation  status today.  I will see him back in 2 weeks to go over these results and their implications.  A portion of this visit was also spent highlighting to the patient his numerous comorbidities and how his mortality appears to be more pronounced over these next 5-10 years if lifestyle/health changes are not implemented.  The patient understands all the plans discussed today and is in agreement with them.  I do appreciate Marianne Sofia, PA-C for his new consult.   Doris Mcgilvery Kirby Funk, MD

## 2022-10-20 ENCOUNTER — Encounter: Payer: Self-pay | Admitting: Oncology

## 2022-10-20 ENCOUNTER — Inpatient Hospital Stay: Payer: Medicaid Other

## 2022-10-20 ENCOUNTER — Inpatient Hospital Stay: Payer: Medicaid Other | Attending: Oncology | Admitting: Oncology

## 2022-10-20 VITALS — BP 158/83 | HR 98 | Temp 98.7°F | Resp 18 | Ht 64.0 in | Wt 316.0 lb

## 2022-10-20 DIAGNOSIS — M791 Myalgia, unspecified site: Secondary | ICD-10-CM

## 2022-10-20 DIAGNOSIS — R7989 Other specified abnormal findings of blood chemistry: Secondary | ICD-10-CM

## 2022-10-20 DIAGNOSIS — M2550 Pain in unspecified joint: Secondary | ICD-10-CM

## 2022-10-20 DIAGNOSIS — R0602 Shortness of breath: Secondary | ICD-10-CM

## 2022-10-20 DIAGNOSIS — Z809 Family history of malignant neoplasm, unspecified: Secondary | ICD-10-CM

## 2022-10-20 DIAGNOSIS — R519 Headache, unspecified: Secondary | ICD-10-CM | POA: Diagnosis not present

## 2022-10-20 DIAGNOSIS — Z8261 Family history of arthritis: Secondary | ICD-10-CM

## 2022-10-20 DIAGNOSIS — Z803 Family history of malignant neoplasm of breast: Secondary | ICD-10-CM

## 2022-10-20 DIAGNOSIS — H9190 Unspecified hearing loss, unspecified ear: Secondary | ICD-10-CM

## 2022-10-20 DIAGNOSIS — Z8042 Family history of malignant neoplasm of prostate: Secondary | ICD-10-CM

## 2022-10-20 DIAGNOSIS — F32A Depression, unspecified: Secondary | ICD-10-CM

## 2022-10-20 DIAGNOSIS — Z8249 Family history of ischemic heart disease and other diseases of the circulatory system: Secondary | ICD-10-CM

## 2022-10-23 ENCOUNTER — Inpatient Hospital Stay: Payer: Medicaid Other | Admitting: Licensed Clinical Social Worker

## 2022-10-23 ENCOUNTER — Telehealth: Payer: Self-pay | Admitting: Oncology

## 2022-10-23 NOTE — Progress Notes (Signed)
[facility] Clinical Social Work  Clinical Social Work was referred by medical provider for assessment of psychosocial needs.  Clinical Social Worker contacted patient by phone to offer support and assess for needs. Patient stated that his primary concerns are food (patient and family's SNAP benefits are recently decreased), Financial (patient and family report medical bills and medications are significant cost), and transportation (transporting to doctor appointments). CSW will check on eligibility for available grants and co-pay assistance. CSW provided patient with [facility] Billing Department to discuss any financial assistance. CSW will send information for food pantries to my chart and follow up on any available assistance.   Marguerita Merles, LCSWA Clinical Social Worker [facility]

## 2022-10-23 NOTE — Progress Notes (Signed)
Spoke with Randall Rojas, he has transportation, main concern is gas money and other bills that he has. He is not seeing Dr. Melvyn Neth for his cancer diagnosis so he would not qualify for the grant offered through the cancer center. He is able to get transportation through his insurance, patient has Medicaid. I will provide him with a $10 Lindie Spruce card at his next appointment.

## 2022-10-23 NOTE — Telephone Encounter (Signed)
Patient has been scheduled. Aware of appt date and time   Scheduling Message Entered by Rennis Harding A on 10/20/2022 at  8:34 PM Priority: Routine <No visit type provided>  Department: [facility]  Provider:  Scheduling Notes:  Video or telephone appt on 11-03-22

## 2022-10-23 NOTE — Progress Notes (Signed)
[facility] CSW Progress Note  Clinical Child psychotherapist sent food resources for [employer]. Via [employer] per patient request. CSW reached out to Patient Sports coach for assistance with meeting financial concerns (transportation and co-pays for medications / appointments).Patient Financial Resource Specialist Delorise Jackson will contact patient.   Marguerita Merles, LCSWA Clinical Social Worker [facility]

## 2022-10-24 ENCOUNTER — Other Ambulatory Visit: Payer: Self-pay | Admitting: Family Medicine

## 2022-10-24 DIAGNOSIS — K219 Gastro-esophageal reflux disease without esophagitis: Secondary | ICD-10-CM

## 2022-10-27 ENCOUNTER — Telehealth: Payer: Self-pay | Admitting: Cardiology

## 2022-10-27 ENCOUNTER — Other Ambulatory Visit: Payer: Self-pay | Admitting: Family Medicine

## 2022-10-27 DIAGNOSIS — R110 Nausea: Secondary | ICD-10-CM

## 2022-10-27 NOTE — Telephone Encounter (Signed)
Please review uploaded disability forms in patient chart under media tab. Thank you

## 2022-10-31 ENCOUNTER — Telehealth: Payer: Self-pay

## 2022-10-31 NOTE — Telephone Encounter (Signed)
group benefit solutions: long-term disability

## 2022-11-01 ENCOUNTER — Telehealth: Payer: Self-pay | Admitting: Cardiology

## 2022-11-01 NOTE — Telephone Encounter (Signed)
[employer] was calling to follow up on the patient's disability paperwork (see phone note 08/02). [facility] gave a ref number: [id number]

## 2022-11-01 NOTE — Telephone Encounter (Signed)
Forwarded this message to pt's provider and nurse.

## 2022-11-01 NOTE — Telephone Encounter (Signed)
Spoke with [employer] and gave information.  The team will make note. He ask if paperwork is received please note this information on and fax it.

## 2022-11-03 ENCOUNTER — Inpatient Hospital Stay: Payer: Medicaid Other | Admitting: Oncology

## 2022-11-03 NOTE — Progress Notes (Signed)
[facility]  [address] [place],  [place]  [address] [phone]  Clinic Day:  11/03/2022  Referring physician: Marianne Sofia, PA-C   HISTORY OF PRESENT ILLNESS:  The patient is a 62 y.o. male  who I was asked to consult upon for an elevated ferritin level.  Labs in June 2024 showed an elevated ferritin of 844.  When checked earlier this month, it was somewhat lower at 717.  However, the rest of his iron panel was essentially normal.  The patient denies there being a family history of hemochromatosis or other blood disorders.  Of note, this gentleman does have multiple comorbidities, including congestive heart failure cerebrovascular disease, coronary artery disease, and prostate cancer.  PAST MEDICAL HISTORY:   Past Medical History:  Diagnosis Date  . Acute laryngopharyngitis 11/11/2020  . Acute URI 12/12/2019  . Anxiety 06/17/2020  . Arthralgia 06/17/2020  . Asthma   . Bilateral leg edema 06/23/2020  . Chest pain 04/15/2015  . Chronic diastolic heart failure (HCC) 08/05/2020  . Chronic heart failure with preserved ejection fraction (HCC) 07/11/2020  . Coronary artery calcification seen on CT scan 04/15/2015  . Coronary artery disease   . Coronary artery disease involving native coronary artery of native heart without angina pectoris 07/11/2020  . Coronary artery disease of native artery of native heart with stable angina pectoris (HCC) 06/23/2020  . Essential hypertension 04/15/2015  . Exertional dyspnea 05/26/2020  . Frequent headaches 07/20/2020  . GERD (gastroesophageal reflux disease) 05/27/2019  . History of CVA in adulthood 08/05/2020  . HLD (hyperlipidemia) 07/11/2020  . Hyperglycemia 06/06/2015  . Hyperlipidemia 04/15/2015  . Hypertension 08/05/2020  . Hypokalemia 07/20/2020  . Left sided numbness 07/25/2020  . Leukocytosis 06/06/2015  . Malaise 06/17/2020  . Mixed hyperlipidemia 04/15/2015  . Morbid obesity (HCC) 04/15/2015  . Need for prophylactic  vaccination against Streptococcus pneumoniae (pneumococcus) 12/03/2019  . Need for prophylactic vaccination and inoculation against influenza 12/03/2019  . Need for tetanus booster 06/17/2020  . Nonepileptic episode (HCC) 10/20/2020  . Obesity, Class III, BMI 40-49.9 (morbid obesity) (HCC) 04/15/2015  . Other sleep apnea 07/20/2020  . Palpitations 08/05/2020  . Pneumonia due to COVID-19 virus 04/06/2019  . Prediabetes 06/23/2020  . Prostate cancer screening 06/02/2019  . Rectal bleeding 11/01/2020  . Seizure-like activity (HCC) 05/26/2020  . Shortness of breath 06/23/2020  . TIA (transient ischemic attack)   . Upper extremity weakness 05/26/2020  . Weakness of left lower extremity 05/26/2020    PAST SURGICAL HISTORY:   Past Surgical History:  Procedure Laterality Date  . CARDIAC CATHETERIZATION N/A 04/22/2015   Procedure: Left Heart Cath and Coronary Angiography;  Surgeon: Peter M Swaziland, MD;  Location: [facility] INVASIVE CV LAB;  Service: Cardiovascular;  Laterality: N/A;  . GALLBLADDER SURGERY    . LEFT HEART CATH AND CORONARY ANGIOGRAPHY N/A 10/28/2020   Procedure: LEFT HEART CATH AND CORONARY ANGIOGRAPHY;  Surgeon: Lennette Bihari, MD;  Location: [facility] INVASIVE CV LAB;  Service: Cardiovascular;  Laterality: N/A;  . SINUS SURGERY WITH INSTATRAK      CURRENT MEDICATIONS:   Current Outpatient Medications  Medication Sig Dispense Refill  . acetaminophen (TYLENOL) 500 MG tablet Take by mouth.    Marland Kitchen amitriptyline (ELAVIL) 50 MG tablet TAKE ONE TABLET BY MOUTH AT BEDTIME 30 tablet 1  . baclofen (LIORESAL) 10 MG tablet Take 10-20 tablets by mouth every 6 (six) hours as needed (pain).    . benzonatate (TESSALON) 200 MG capsule Take 1  capsule (200 mg total) by mouth 3 (three) times daily as needed for cough. 30 capsule 1  . blood glucose meter kit and supplies KIT Dispense based on patient and insurance preference. Use up to four times daily as directed. 1 each 0  . bumetanide (BUMEX) 2 MG tablet Take 1 tablet (2 mg  total) by mouth 2 (two) times daily. 180 tablet 3  . busPIRone (BUSPAR) 10 MG tablet Take 10 mg by mouth 2 (two) times daily.    Marland Kitchen GOODSENSE ASPIRIN 81 MG chewable tablet Chew 81 mg by mouth daily.    Marland Kitchen ipratropium-albuterol (DUONEB) 0.5-2.5 (3) MG/3ML SOLN Inhale into the lungs.    Marland Kitchen ketoconazole (NIZORAL) 2 % shampoo Apply topically 2 (two) times a week. 120 mL 1  . levETIRAcetam (KEPPRA) 750 MG tablet Take 1,500 mg by mouth 2 (two) times daily.    . Misc Natural Products (URINOZINC PLUS PO) Take by mouth daily.    . montelukast (SINGULAIR) 10 MG tablet Take 10 mg by mouth daily.    . nitroGLYCERIN (NITROSTAT) 0.4 MG SL tablet Place 1 tablet (0.4 mg total) under the tongue every 5 (five) minutes as needed for chest pain. Up to three times. 45 tablet 3  . NURTEC 75 MG TBDP Take 1 tablet by mouth every other day.    Marland Kitchen omeprazole (PRILOSEC) 40 MG capsule Take 1 capsule by mouth once daily. 30 capsule 3  . OXYGEN Inhale 2 L into the lungs daily as needed.    . potassium chloride SA (KLOR-CON M) 20 MEQ tablet Take 1 tablet (20 mEq total) by mouth 2 (two) times daily. 180 tablet 3  . promethazine (PHENERGAN) 25 MG tablet Take 1 tablet (25 mg total) by mouth every 6 (six) hours as needed for nausea or vomiting. 30 tablet 0  . ranolazine (RANEXA) 500 MG 12 hr tablet Take 1 tablet by mouth twice daily. 180 tablet 3  . rosuvastatin (CRESTOR) 10 MG tablet TAKE 1 TABLET BY MOUTH ONCE DAILY. 90 tablet 3  . sertraline (ZOLOFT) 100 MG tablet Take 200 mg by mouth daily.    . traZODone (DESYREL) 50 MG tablet TAKE 1/2 TABLET BY MOUTH AT BEDTIME FOR ONE WEEK, THEN TAKE ONE TABLET AT BEDTIME    . VENTOLIN HFA 108 (90 Base) MCG/ACT inhaler INHALE TWO PUFFS INTO THE LUNGS EVERY 6 HOURS AS NEEDED FOR WHEEZING OR SHORTNESS OF BREATH 18 g 1  . vitamin B-12 (CYANOCOBALAMIN) 1000 MCG tablet Take 1,000 mcg by mouth daily.    . Vitamin D, Ergocalciferol, 50000 units CAPS Take 1 capsule by mouth once every 7 days as  directed 12 capsule 3   No current facility-administered medications for this visit.    ALLERGIES:   Allergies  Allergen Reactions  . Cephalexin Nausea And Vomiting, Rash and Other (See Comments)  . Ubrogepant Swelling and Other (See Comments)    FAMILY HISTORY:   Family History  Problem Relation Age of Onset  . Hypertension Mother   . Rheum arthritis Mother   . Heart attack Father   . Prostate cancer Father        METS TO BRAIN; H/O SKIN CANCER  . Hypertension Sister   . Heart disease Sister   . Heart attack Sister   . Hypertension Sister   . Prostate cancer Brother        METS TO BONE AND LIVER  . Breast cancer Paternal Aunt   . Prostate cancer Paternal Uncle  METS TO LYMPH NODES  . Cancer Paternal Uncle        UNKNOWN PRIMARY ORAL MALIGNANCY  . Breast cancer Cousin     SOCIAL HISTORY:  The patient was born and raised in [place].  He lives in the [place] community with his wife of 38 years.  They have 3 children and 9 grandchildren.  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  The patient did smoke as much as 3 packs of cigarettes daily for 15 years before quitting approximately 15 years ago.  There is no history of alcohol abuse.  REVIEW OF SYSTEMS:  Review of Systems  Constitutional:  Negative for fatigue, fever and unexpected weight change.  HENT:   Positive for hearing loss.   Respiratory:  Positive for shortness of breath. Negative for chest tightness, cough and hemoptysis.   Cardiovascular:  Positive for chest pain. Negative for palpitations.  Gastrointestinal:  Positive for blood in stool. Negative for abdominal distention, abdominal pain, constipation, diarrhea, nausea and vomiting.  Genitourinary:  Negative for dysuria, frequency and hematuria.   Musculoskeletal:  Positive for arthralgias and myalgias. Negative for back pain.  Skin:  Negative for itching and rash.  Neurological:  Positive for headaches.  Negative for dizziness and light-headedness.  Psychiatric/Behavioral:  Positive for depression. Negative for suicidal ideas. The patient is nervous/anxious.      PHYSICAL EXAM:  There were no vitals taken for this visit. Wt Readings from Last 3 Encounters:  10/20/22 (!) 316 lb (143.3 kg)  09/21/22 (!) 316 lb (143.3 kg)  09/20/22 (!) 320 lb 9.6 oz (145.4 kg)   There is no height or weight on file to calculate BMI. Performance status (ECOG): 1 - Symptomatic but completely ambulatory Physical Exam Constitutional:      Appearance: Normal appearance. He is not ill-appearing.  HENT:     Mouth/Throat:     Mouth: Mucous membranes are moist.     Pharynx: Oropharynx is clear. No oropharyngeal exudate or posterior oropharyngeal erythema.  Cardiovascular:     Rate and Rhythm: Normal rate and regular rhythm.     Heart sounds: No murmur heard.    No friction rub. No gallop.  Pulmonary:     Effort: Pulmonary effort is normal. No respiratory distress.     Breath sounds: Normal breath sounds. No wheezing, rhonchi or rales.  Abdominal:     General: Bowel sounds are normal. There is no distension.     Palpations: Abdomen is soft. There is no mass.     Tenderness: There is no abdominal tenderness.  Musculoskeletal:        General: No swelling.     Right lower leg: No edema.     Left lower leg: No edema.  Lymphadenopathy:     Cervical: No cervical adenopathy.     Upper Body:     Right upper body: No supraclavicular or axillary adenopathy.     Left upper body: No supraclavicular or axillary adenopathy.     Lower Body: No right inguinal adenopathy. No left inguinal adenopathy.  Skin:    General: Skin is warm.     Coloration: Skin is not jaundiced.     Findings: No lesion or rash.  Neurological:     General: No focal deficit present.     Mental Status: He is alert and oriented to person, place, and time. Mental status is at baseline.  Psychiatric:  Mood and Affect: Mood normal.         Behavior: Behavior normal.        Thought Content: Thought content normal.    LABS:      Latest Ref Rng & Units 10/12/2022   10:02 AM 09/21/2022    9:57 AM 07/12/2022    1:50 PM  CBC  WBC 3.4 - 10.8 x10E3/uL 6.7  7.4  8.7   Hemoglobin 13.0 - 17.7 g/dL 72.9  02.1  11.5   Hematocrit 37.5 - 51.0 % 44.5  45.1  46.5   Platelets 150 - 450 x10E3/uL 139  153  167       Latest Ref Rng & Units 10/12/2022   10:02 AM 09/21/2022    9:57 AM 06/14/2022    8:48 AM  CMP  Glucose 70 - 99 mg/dL 520  98  98   BUN 8 - 27 mg/dL 8  11  14    Creatinine 0.76 - 1.27 mg/dL 8.02  2.33  6.12   Sodium 134 - 144 mmol/L 141  143  143   Potassium 3.5 - 5.2 mmol/L 4.4  4.5  4.4   Chloride 96 - 106 mmol/L 102  102  104   CO2 20 - 29 mmol/L 25  27  24    Calcium 8.6 - 10.2 mg/dL 8.9  8.9  8.7   Total Protein 6.0 - 8.5 g/dL 6.4  6.7  6.3   Total Bilirubin 0.0 - 1.2 mg/dL 0.4  0.4  0.7   Alkaline Phos 44 - 121 IU/L 124  106  97   AST 0 - 40 IU/L 36  43  19   ALT 0 - 44 IU/L 40  40  27     Latest Reference Range & Units 10/12/22 10:02  Iron 38 - 169 ug/dL 83  UIBC 244 - 975 ug/dL 300  TIBC 511 - 021 ug/dL 117  Ferritin 30 - 356 ng/mL 717 (H)  Iron Saturation 15 - 55 % 26  (H): Data is abnormally high   ASSESSMENT & PLAN:  A 62 y.o. male who I was asked to consult upon for a persistently elevated ferritin level.  Usually, when this is present, the concern for hemochromatosis exists.  However, when looking at his recent iron panels, his ferritin level was only iron parameter that was elevated.  Usually, when hemochromatosis is present, ferritin, serum iron, and iron saturation levels tend to all be elevated.  The fact that only his ferritin is elevated suggests this may be due to it being an acute phase reactant.  As mentioned previously, this gentleman does have numerous medical issues which could definitely be factoring into his elevated ferritin level.  For completeness, I will check his hemochromatosis mutation  status today.  I will see him back in 2 weeks to go over these results and their implications.  A portion of this visit was also spent highlighting to the patient his numerous comorbidities and how his mortality appears to be more pronounced over these next 5-10 years if lifestyle/health changes are not implemented.  The patient understands all the plans discussed today and is in agreement with them.  I do appreciate Marianne Sofia, PA-C for his new consult.    Kirby Funk, MD

## 2022-11-10 ENCOUNTER — Inpatient Hospital Stay: Payer: Medicaid Other | Attending: Oncology

## 2022-11-10 DIAGNOSIS — Z803 Family history of malignant neoplasm of breast: Secondary | ICD-10-CM | POA: Diagnosis not present

## 2022-11-10 DIAGNOSIS — Z8616 Personal history of COVID-19: Secondary | ICD-10-CM | POA: Diagnosis not present

## 2022-11-10 DIAGNOSIS — E782 Mixed hyperlipidemia: Secondary | ICD-10-CM | POA: Insufficient documentation

## 2022-11-10 DIAGNOSIS — Z8042 Family history of malignant neoplasm of prostate: Secondary | ICD-10-CM | POA: Insufficient documentation

## 2022-11-10 DIAGNOSIS — I2510 Atherosclerotic heart disease of native coronary artery without angina pectoris: Secondary | ICD-10-CM | POA: Diagnosis not present

## 2022-11-10 DIAGNOSIS — I110 Hypertensive heart disease with heart failure: Secondary | ICD-10-CM | POA: Diagnosis not present

## 2022-11-10 DIAGNOSIS — Z8249 Family history of ischemic heart disease and other diseases of the circulatory system: Secondary | ICD-10-CM | POA: Insufficient documentation

## 2022-11-10 DIAGNOSIS — Z881 Allergy status to other antibiotic agents status: Secondary | ICD-10-CM | POA: Insufficient documentation

## 2022-11-10 DIAGNOSIS — C61 Malignant neoplasm of prostate: Secondary | ICD-10-CM | POA: Insufficient documentation

## 2022-11-10 DIAGNOSIS — J45909 Unspecified asthma, uncomplicated: Secondary | ICD-10-CM | POA: Insufficient documentation

## 2022-11-10 DIAGNOSIS — Z79899 Other long term (current) drug therapy: Secondary | ICD-10-CM | POA: Insufficient documentation

## 2022-11-10 DIAGNOSIS — I5032 Chronic diastolic (congestive) heart failure: Secondary | ICD-10-CM | POA: Diagnosis not present

## 2022-11-10 DIAGNOSIS — Z8673 Personal history of transient ischemic attack (TIA), and cerebral infarction without residual deficits: Secondary | ICD-10-CM | POA: Insufficient documentation

## 2022-11-10 DIAGNOSIS — Z809 Family history of malignant neoplasm, unspecified: Secondary | ICD-10-CM | POA: Diagnosis not present

## 2022-11-10 DIAGNOSIS — K219 Gastro-esophageal reflux disease without esophagitis: Secondary | ICD-10-CM | POA: Insufficient documentation

## 2022-11-10 DIAGNOSIS — Z8261 Family history of arthritis: Secondary | ICD-10-CM | POA: Diagnosis not present

## 2022-11-10 LAB — CBC WITH DIFFERENTIAL (CANCER CENTER ONLY)
Abs Immature Granulocytes: 0.02 10*3/uL (ref 0.00–0.07)
Basophils Absolute: 0 10*3/uL (ref 0.0–0.1)
Basophils Relative: 1 %
Eosinophils Absolute: 0.3 10*3/uL (ref 0.0–0.5)
Eosinophils Relative: 4 %
HCT: 44.8 % (ref 39.0–52.0)
Hemoglobin: 14.5 g/dL (ref 13.0–17.0)
Immature Granulocytes: 0 %
Lymphocytes Relative: 36 %
Lymphs Abs: 2.5 10*3/uL (ref 0.7–4.0)
MCH: 31.4 pg (ref 26.0–34.0)
MCHC: 32.4 g/dL (ref 30.0–36.0)
MCV: 97 fL (ref 80.0–100.0)
Monocytes Absolute: 0.5 10*3/uL (ref 0.1–1.0)
Monocytes Relative: 7 %
Neutro Abs: 3.7 10*3/uL (ref 1.7–7.7)
Neutrophils Relative %: 52 %
Platelet Count: 148 10*3/uL — ABNORMAL LOW (ref 150–400)
RBC: 4.62 MIL/uL (ref 4.22–5.81)
RDW: 14.6 % (ref 11.5–15.5)
WBC Count: 7 10*3/uL (ref 4.0–10.5)
nRBC: 0 % (ref 0.0–0.2)

## 2022-11-10 LAB — FERRITIN: Ferritin: 649 ng/mL — ABNORMAL HIGH (ref 24–336)

## 2022-11-10 LAB — IRON AND TIBC
Iron: 83 ug/dL (ref 45–182)
Saturation Ratios: 23 % (ref 17.9–39.5)
TIBC: 368 ug/dL (ref 250–450)
UIBC: 285 ug/dL

## 2022-11-13 NOTE — Progress Notes (Deleted)
[facility]  [address] [place],  [place]  [address] [phone]  Clinic Day:  11/13/2022  Referring physician: Marianne Sofia, PA-C   HISTORY OF PRESENT ILLNESS:  The patient is a 62 y.o. male  who I was asked to consult upon for an elevated ferritin level.  Labs in June 2024 showed an elevated ferritin of 844.  When checked earlier this month, it was somewhat lower at 717.  However, the rest of his iron panel was essentially normal.  The patient denies there being a family history of hemochromatosis or other blood disorders.  Of note, this gentleman does have multiple comorbidities, including congestive heart failure cerebrovascular disease, coronary artery disease, and prostate cancer.  PAST MEDICAL HISTORY:   Past Medical History:  Diagnosis Date   Acute laryngopharyngitis 11/11/2020   Acute URI 12/12/2019   Anxiety 06/17/2020   Arthralgia 06/17/2020   Asthma    Bilateral leg edema 06/23/2020   Chest pain 04/15/2015   Chronic diastolic heart failure (HCC) 08/05/2020   Chronic heart failure with preserved ejection fraction (HCC) 07/11/2020   Coronary artery calcification seen on CT scan 04/15/2015   Coronary artery disease    Coronary artery disease involving native coronary artery of native heart without angina pectoris 07/11/2020   Coronary artery disease of native artery of native heart with stable angina pectoris (HCC) 06/23/2020   Essential hypertension 04/15/2015   Exertional dyspnea 05/26/2020   Frequent headaches 07/20/2020   GERD (gastroesophageal reflux disease) 05/27/2019   History of CVA in adulthood 08/05/2020   HLD (hyperlipidemia) 07/11/2020   Hyperglycemia 06/06/2015   Hyperlipidemia 04/15/2015   Hypertension 08/05/2020   Hypokalemia 07/20/2020   Left sided numbness 07/25/2020   Leukocytosis 06/06/2015   Malaise 06/17/2020   Mixed hyperlipidemia 04/15/2015   Morbid obesity (HCC) 04/15/2015   Need for prophylactic vaccination against Streptococcus  pneumoniae (pneumococcus) 12/03/2019   Need for prophylactic vaccination and inoculation against influenza 12/03/2019   Need for tetanus booster 06/17/2020   Nonepileptic episode (HCC) 10/20/2020   Obesity, Class III, BMI 40-49.9 (morbid obesity) (HCC) 04/15/2015   Other sleep apnea 07/20/2020   Palpitations 08/05/2020   Pneumonia due to COVID-19 virus 04/06/2019   Prediabetes 06/23/2020   Prostate cancer screening 06/02/2019   Rectal bleeding 11/01/2020   Seizure-like activity (HCC) 05/26/2020   Shortness of breath 06/23/2020   TIA (transient ischemic attack)    Upper extremity weakness 05/26/2020   Weakness of left lower extremity 05/26/2020    PAST SURGICAL HISTORY:   Past Surgical History:  Procedure Laterality Date   CARDIAC CATHETERIZATION N/A 04/22/2015   Procedure: Left Heart Cath and Coronary Angiography;  Surgeon: Peter M Swaziland, MD;  Location: [facility] INVASIVE CV LAB;  Service: Cardiovascular;  Laterality: N/A;   GALLBLADDER SURGERY     LEFT HEART CATH AND CORONARY ANGIOGRAPHY N/A 10/28/2020   Procedure: LEFT HEART CATH AND CORONARY ANGIOGRAPHY;  Surgeon: Lennette Bihari, MD;  Location: [facility] INVASIVE CV LAB;  Service: Cardiovascular;  Laterality: N/A;   SINUS SURGERY WITH INSTATRAK      CURRENT MEDICATIONS:   Current Outpatient Medications  Medication Sig Dispense Refill   acetaminophen (TYLENOL) 500 MG tablet Take by mouth.     amitriptyline (ELAVIL) 50 MG tablet TAKE ONE TABLET BY MOUTH AT BEDTIME 30 tablet 1   baclofen (LIORESAL) 10 MG tablet Take 10-20 tablets by mouth every 6 (six) hours as needed (pain).     benzonatate (TESSALON) 200 MG capsule Take 1  capsule (200 mg total) by mouth 3 (three) times daily as needed for cough. 30 capsule 1   blood glucose meter kit and supplies KIT Dispense based on patient and insurance preference. Use up to four times daily as directed. 1 each 0   bumetanide (BUMEX) 2 MG tablet Take 1 tablet (2 mg total) by mouth 2 (two) times daily. 180 tablet 3    busPIRone (BUSPAR) 10 MG tablet Take 10 mg by mouth 2 (two) times daily.     GOODSENSE ASPIRIN 81 MG chewable tablet Chew 81 mg by mouth daily.     ipratropium-albuterol (DUONEB) 0.5-2.5 (3) MG/3ML SOLN Inhale into the lungs.     ketoconazole (NIZORAL) 2 % shampoo Apply topically 2 (two) times a week. 120 mL 1   levETIRAcetam (KEPPRA) 750 MG tablet Take 1,500 mg by mouth 2 (two) times daily.     Misc Natural Products (URINOZINC PLUS PO) Take by mouth daily.     montelukast (SINGULAIR) 10 MG tablet Take 10 mg by mouth daily.     nitroGLYCERIN (NITROSTAT) 0.4 MG SL tablet Place 1 tablet (0.4 mg total) under the tongue every 5 (five) minutes as needed for chest pain. Up to three times. 45 tablet 3   NURTEC 75 MG TBDP Take 1 tablet by mouth every other day.     omeprazole (PRILOSEC) 40 MG capsule Take 1 capsule by mouth once daily. 30 capsule 3   OXYGEN Inhale 2 L into the lungs daily as needed.     potassium chloride SA (KLOR-CON M) 20 MEQ tablet Take 1 tablet (20 mEq total) by mouth 2 (two) times daily. 180 tablet 3   promethazine (PHENERGAN) 25 MG tablet Take 1 tablet (25 mg total) by mouth every 6 (six) hours as needed for nausea or vomiting. 30 tablet 0   ranolazine (RANEXA) 500 MG 12 hr tablet Take 1 tablet by mouth twice daily. 180 tablet 3   rosuvastatin (CRESTOR) 10 MG tablet TAKE 1 TABLET BY MOUTH ONCE DAILY. 90 tablet 3   sertraline (ZOLOFT) 100 MG tablet Take 200 mg by mouth daily.     traZODone (DESYREL) 50 MG tablet TAKE 1/2 TABLET BY MOUTH AT BEDTIME FOR ONE WEEK, THEN TAKE ONE TABLET AT BEDTIME     VENTOLIN HFA 108 (90 Base) MCG/ACT inhaler INHALE TWO PUFFS INTO THE LUNGS EVERY 6 HOURS AS NEEDED FOR WHEEZING OR SHORTNESS OF BREATH 18 g 1   vitamin B-12 (CYANOCOBALAMIN) 1000 MCG tablet Take 1,000 mcg by mouth daily.     Vitamin D, Ergocalciferol, 50000 units CAPS Take 1 capsule by mouth once every 7 days as directed 12 capsule 3   No current facility-administered medications for this  visit.    ALLERGIES:   Allergies  Allergen Reactions   Cephalexin Nausea And Vomiting, Rash and Other (See Comments)   Ubrogepant Swelling and Other (See Comments)    FAMILY HISTORY:   Family History  Problem Relation Age of Onset   Hypertension Mother    Rheum arthritis Mother    Heart attack Father    Prostate cancer Father        METS TO BRAIN; H/O SKIN CANCER   Hypertension Sister    Heart disease Sister    Heart attack Sister    Hypertension Sister    Prostate cancer Brother        METS TO BONE AND LIVER   Breast cancer Paternal Aunt    Prostate cancer Paternal Uncle  METS TO LYMPH NODES   Cancer Paternal Uncle        UNKNOWN PRIMARY ORAL MALIGNANCY   Breast cancer Cousin     SOCIAL HISTORY:  The patient was born and raised in [place].  He lives in the [place] community with his wife of 38 years.  They have 3 children and 9 grandchildren.  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  The patient did smoke as much as 3 packs of cigarettes daily for 15 years before quitting approximately 15 years ago.  There is no history of alcohol abuse.  REVIEW OF SYSTEMS:  Review of Systems  Constitutional:  Negative for fatigue, fever and unexpected weight change.  HENT:   Positive for hearing loss.   Respiratory:  Positive for shortness of breath. Negative for chest tightness, cough and hemoptysis.   Cardiovascular:  Positive for chest pain. Negative for palpitations.  Gastrointestinal:  Positive for blood in stool. Negative for abdominal distention, abdominal pain, constipation, diarrhea, nausea and vomiting.  Genitourinary:  Negative for dysuria, frequency and hematuria.   Musculoskeletal:  Positive for arthralgias and myalgias. Negative for back pain.  Skin:  Negative for itching and rash.  Neurological:  Positive for headaches. Negative for dizziness and light-headedness.  Psychiatric/Behavioral:  Positive for  depression. Negative for suicidal ideas. The patient is nervous/anxious.      PHYSICAL EXAM:  There were no vitals taken for this visit. Wt Readings from Last 3 Encounters:  10/20/22 (!) 316 lb (143.3 kg)  09/21/22 (!) 316 lb (143.3 kg)  09/20/22 (!) 320 lb 9.6 oz (145.4 kg)   There is no height or weight on file to calculate BMI. Performance status (ECOG): 1 - Symptomatic but completely ambulatory Physical Exam Constitutional:      Appearance: Normal appearance. He is not ill-appearing.  HENT:     Mouth/Throat:     Mouth: Mucous membranes are moist.     Pharynx: Oropharynx is clear. No oropharyngeal exudate or posterior oropharyngeal erythema.  Cardiovascular:     Rate and Rhythm: Normal rate and regular rhythm.     Heart sounds: No murmur heard.    No friction rub. No gallop.  Pulmonary:     Effort: Pulmonary effort is normal. No respiratory distress.     Breath sounds: Normal breath sounds. No wheezing, rhonchi or rales.  Abdominal:     General: Bowel sounds are normal. There is no distension.     Palpations: Abdomen is soft. There is no mass.     Tenderness: There is no abdominal tenderness.  Musculoskeletal:        General: No swelling.     Right lower leg: No edema.     Left lower leg: No edema.  Lymphadenopathy:     Cervical: No cervical adenopathy.     Upper Body:     Right upper body: No supraclavicular or axillary adenopathy.     Left upper body: No supraclavicular or axillary adenopathy.     Lower Body: No right inguinal adenopathy. No left inguinal adenopathy.  Skin:    General: Skin is warm.     Coloration: Skin is not jaundiced.     Findings: No lesion or rash.  Neurological:     General: No focal deficit present.     Mental Status: He is alert and oriented to person, place, and time. Mental status is at baseline.  Psychiatric:  Mood and Affect: Mood normal.        Behavior: Behavior normal.        Thought Content: Thought content normal.      LABS:      Latest Ref Rng & Units 11/10/2022   11:48 AM 10/12/2022   10:02 AM 09/21/2022    9:57 AM  CBC  WBC 4.0 - 10.5 K/uL 7.0  6.7  7.4   Hemoglobin 13.0 - 17.0 g/dL 13.0  86.5  78.4   Hematocrit 39.0 - 52.0 % 44.8  44.5  45.1   Platelets 150 - 400 K/uL 148  139  153       Latest Ref Rng & Units 10/12/2022   10:02 AM 09/21/2022    9:57 AM 06/14/2022    8:48 AM  CMP  Glucose 70 - 99 mg/dL 696  98  98   BUN 8 - 27 mg/dL 8  11  14    Creatinine 0.76 - 1.27 mg/dL 2.95  2.84  1.32   Sodium 134 - 144 mmol/L 141  143  143   Potassium 3.5 - 5.2 mmol/L 4.4  4.5  4.4   Chloride 96 - 106 mmol/L 102  102  104   CO2 20 - 29 mmol/L 25  27  24    Calcium 8.6 - 10.2 mg/dL 8.9  8.9  8.7   Total Protein 6.0 - 8.5 g/dL 6.4  6.7  6.3   Total Bilirubin 0.0 - 1.2 mg/dL 0.4  0.4  0.7   Alkaline Phos 44 - 121 IU/L 124  106  97   AST 0 - 40 IU/L 36  43  19   ALT 0 - 44 IU/L 40  40  27     Latest Reference Range & Units 10/12/22 10:02  Iron 38 - 169 ug/dL 83  UIBC 440 - 102 ug/dL 725  TIBC 366 - 440 ug/dL 347  Ferritin 30 - 425 ng/mL 717 (H)  Iron Saturation 15 - 55 % 26  (H): Data is abnormally high   ASSESSMENT & PLAN:  A 62 y.o. male who I was asked to consult upon for a persistently elevated ferritin level.  Usually, when this is present, the concern for hemochromatosis exists.  However, when looking at his recent iron panels, his ferritin level was only iron parameter that was elevated.  Usually, when hemochromatosis is present, ferritin, serum iron, and iron saturation levels tend to all be elevated.  The fact that only his ferritin is elevated suggests this may be due to it being an acute phase reactant.  As mentioned previously, this gentleman does have numerous medical issues which could definitely be factoring into his elevated ferritin level.  For completeness, I will check his hemochromatosis mutation status today.  I will see him back in 2 weeks to go over these results and their  implications.  A portion of this visit was also spent highlighting to the patient his numerous comorbidities and how his mortality appears to be more pronounced over these next 5-10 years if lifestyle/health changes are not implemented.  The patient understands all the plans discussed today and is in agreement with them.  I do appreciate Marianne Sofia, PA-C for his new consult.   Nakeia Calvi Kirby Funk, MD

## 2022-11-14 ENCOUNTER — Inpatient Hospital Stay: Payer: Medicaid Other | Admitting: Oncology

## 2022-11-14 ENCOUNTER — Other Ambulatory Visit: Payer: Self-pay | Admitting: Oncology

## 2022-11-14 ENCOUNTER — Encounter: Payer: Self-pay | Admitting: Cardiology

## 2022-11-14 ENCOUNTER — Inpatient Hospital Stay: Payer: Medicaid Other

## 2022-11-14 DIAGNOSIS — R7989 Other specified abnormal findings of blood chemistry: Secondary | ICD-10-CM

## 2022-11-14 DIAGNOSIS — C61 Malignant neoplasm of prostate: Secondary | ICD-10-CM | POA: Diagnosis not present

## 2022-11-15 ENCOUNTER — Encounter: Payer: Self-pay | Admitting: Physician Assistant

## 2022-11-22 ENCOUNTER — Other Ambulatory Visit: Payer: Self-pay | Admitting: Cardiology

## 2022-11-22 LAB — HEMOCHROMATOSIS DNA-PCR(C282Y,H63D)

## 2022-11-28 ENCOUNTER — Other Ambulatory Visit: Payer: Self-pay | Admitting: Family Medicine

## 2022-11-29 NOTE — Progress Notes (Signed)
[facility]  [address] [place],  [place]  [address] [phone]  Clinic Day:  11/30/2022  Referring physician: Marianne Sofia, PA-C   HISTORY OF PRESENT ILLNESS:  The patient is a 62 y.o. male  who I recently began seeing for an elevated ferritin level. However, his other parameters within his iron panel were normal.  He comes in today to go over labs to determine if he has underlying hemochromatosis.  Of note, this gentleman does have multiple comorbidities, including congestive heart failure, cerebrovascular disease, coronary artery disease, and prostate cancer. Despite these multiple health issues, he denies having any acute changes in his health.  PHYSICAL EXAM:  Blood pressure (!) 142/93, pulse 94, temperature 98.4 F (36.9 C), resp. rate 16, height 5\' 4"  (1.626 m), weight (!) 315 lb 11.2 oz (143.2 kg), SpO2 93%. Wt Readings from Last 3 Encounters:  11/30/22 (!) 315 lb 11.2 oz (143.2 kg)  10/20/22 (!) 316 lb (143.3 kg)  09/21/22 (!) 316 lb (143.3 kg)   Body mass index is 54.19 kg/m. Performance status (ECOG): 1 - Symptomatic but completely ambulatory Physical Exam Constitutional:      Appearance: Normal appearance. He is not ill-appearing.  HENT:     Mouth/Throat:     Mouth: Mucous membranes are moist.     Pharynx: Oropharynx is clear. No oropharyngeal exudate or posterior oropharyngeal erythema.  Cardiovascular:     Rate and Rhythm: Normal rate and regular rhythm.     Heart sounds: No murmur heard.    No friction rub. No gallop.  Pulmonary:     Effort: Pulmonary effort is normal. No respiratory distress.     Breath sounds: Normal breath sounds. No wheezing, rhonchi or rales.  Abdominal:     General: Bowel sounds are normal. There is no distension.     Palpations: Abdomen is soft. There is no mass.     Tenderness: There is no abdominal tenderness.  Musculoskeletal:        General: No swelling.     Right lower leg: No edema.     Left  lower leg: No edema.  Lymphadenopathy:     Cervical: No cervical adenopathy.     Upper Body:     Right upper body: No supraclavicular or axillary adenopathy.     Left upper body: No supraclavicular or axillary adenopathy.     Lower Body: No right inguinal adenopathy. No left inguinal adenopathy.  Skin:    General: Skin is warm.     Coloration: Skin is not jaundiced.     Findings: No lesion or rash.  Neurological:     General: No focal deficit present.     Mental Status: He is alert and oriented to person, place, and time. Mental status is at baseline.  Psychiatric:        Mood and Affect: Mood normal.        Behavior: Behavior normal.        Thought Content: Thought content normal.     LABS:      Latest Ref Rng & Units 11/10/2022   11:48 AM 10/12/2022   10:02 AM 09/21/2022    9:57 AM  CBC  WBC 4.0 - 10.5 K/uL 7.0  6.7  7.4   Hemoglobin 13.0 - 17.0 g/dL 69.6  29.5  28.4   Hematocrit 39.0 - 52.0 % 44.8  44.5  45.1   Platelets 150 - 400 K/uL 148  139  153  Latest Ref Rng & Units 10/12/2022   10:02 AM 09/21/2022    9:57 AM 06/14/2022    8:48 AM  CMP  Glucose 70 - 99 mg/dL 161  98  98   BUN 8 - 27 mg/dL 8  11  14    Creatinine 0.76 - 1.27 mg/dL 0.96  0.45  4.09   Sodium 134 - 144 mmol/L 141  143  143   Potassium 3.5 - 5.2 mmol/L 4.4  4.5  4.4   Chloride 96 - 106 mmol/L 102  102  104   CO2 20 - 29 mmol/L 25  27  24    Calcium 8.6 - 10.2 mg/dL 8.9  8.9  8.7   Total Protein 6.0 - 8.5 g/dL 6.4  6.7  6.3   Total Bilirubin 0.0 - 1.2 mg/dL 0.4  0.4  0.7   Alkaline Phos 44 - 121 IU/L 124  106  97   AST 0 - 40 IU/L 36  43  19   ALT 0 - 44 IU/L 40  40  27     Latest Reference Range & Units 10/12/22 10:02  Iron 38 - 169 ug/dL 83  UIBC 811 - 914 ug/dL 782  TIBC 956 - 213 ug/dL 086  Ferritin 30 - 578 ng/mL 717 (H)  Iron Saturation 15 - 55 % 26  (H): Data is abnormally high  Hemochromatosis test results: c.845G>A (p.Cys282Tyr) - Not Detected  c.187C>G (p.His63Asp) - Not  Detected  c.193A>T (p.Ser65Cys) - Not Detected  Not associated with increased risk to develop clinical symptoms  of Hereditary Hemochromatosis. In symptomatic individuals, other  causes of iron overload should be evaluated.   ASSESSMENT & PLAN:  A 62 y.o. male with an elevated ferritin level.  In clinic today, I went over his hemochromatosis test results, which all came back negative.  Once again, I explained to the patient that his numerous other medical issues are likely what is triggering his elevated ferritin level.  He understands he need to make health/lifestyle changes to get many of his comorbidities under much better control.  Otherwise, as he has no pressing hematologic issues, I do feel comfortable turning his care back over to his primary care office.    The patient understands all the plans discussed today and is in agreement with them.    Monterius Rolf Kirby Funk, MD

## 2022-11-30 ENCOUNTER — Encounter: Payer: Self-pay | Admitting: Physician Assistant

## 2022-11-30 ENCOUNTER — Inpatient Hospital Stay: Payer: Medicaid Other | Attending: Oncology | Admitting: Oncology

## 2022-11-30 VITALS — BP 142/93 | HR 94 | Temp 98.4°F | Resp 16 | Ht 64.0 in | Wt 315.7 lb

## 2022-11-30 DIAGNOSIS — R7989 Other specified abnormal findings of blood chemistry: Secondary | ICD-10-CM | POA: Diagnosis not present

## 2022-12-01 DIAGNOSIS — R7989 Other specified abnormal findings of blood chemistry: Secondary | ICD-10-CM | POA: Insufficient documentation

## 2022-12-07 ENCOUNTER — Other Ambulatory Visit: Payer: Self-pay | Admitting: Physician Assistant

## 2022-12-07 ENCOUNTER — Ambulatory Visit: Payer: Medicaid Other

## 2022-12-07 DIAGNOSIS — Z23 Encounter for immunization: Secondary | ICD-10-CM | POA: Diagnosis not present

## 2022-12-07 DIAGNOSIS — K219 Gastro-esophageal reflux disease without esophagitis: Secondary | ICD-10-CM

## 2022-12-07 MED ORDER — OMEPRAZOLE 40 MG PO CPDR
40.0000 mg | DELAYED_RELEASE_CAPSULE | Freq: Every day | ORAL | 1 refills | Status: DC
Start: 2022-12-07 — End: 2023-02-26

## 2022-12-15 ENCOUNTER — Encounter: Payer: Self-pay | Admitting: Physician Assistant

## 2022-12-26 ENCOUNTER — Other Ambulatory Visit: Payer: Self-pay | Admitting: Physician Assistant

## 2022-12-26 DIAGNOSIS — G4733 Obstructive sleep apnea (adult) (pediatric): Secondary | ICD-10-CM | POA: Diagnosis not present

## 2022-12-27 ENCOUNTER — Ambulatory Visit (INDEPENDENT_AMBULATORY_CARE_PROVIDER_SITE_OTHER): Payer: Medicare Other | Admitting: Physician Assistant

## 2022-12-27 ENCOUNTER — Encounter: Payer: Self-pay | Admitting: Physician Assistant

## 2022-12-27 VITALS — BP 118/82 | HR 83 | Temp 97.3°F | Ht 64.0 in | Wt 321.0 lb

## 2022-12-27 DIAGNOSIS — E538 Deficiency of other specified B group vitamins: Secondary | ICD-10-CM | POA: Diagnosis not present

## 2022-12-27 DIAGNOSIS — K219 Gastro-esophageal reflux disease without esophagitis: Secondary | ICD-10-CM | POA: Diagnosis not present

## 2022-12-27 DIAGNOSIS — I10 Essential (primary) hypertension: Secondary | ICD-10-CM | POA: Diagnosis not present

## 2022-12-27 DIAGNOSIS — E782 Mixed hyperlipidemia: Secondary | ICD-10-CM

## 2022-12-27 DIAGNOSIS — I25118 Atherosclerotic heart disease of native coronary artery with other forms of angina pectoris: Secondary | ICD-10-CM

## 2022-12-27 DIAGNOSIS — R7989 Other specified abnormal findings of blood chemistry: Secondary | ICD-10-CM | POA: Diagnosis not present

## 2022-12-27 DIAGNOSIS — E559 Vitamin D deficiency, unspecified: Secondary | ICD-10-CM | POA: Diagnosis not present

## 2022-12-27 DIAGNOSIS — R569 Unspecified convulsions: Secondary | ICD-10-CM | POA: Diagnosis not present

## 2022-12-27 DIAGNOSIS — R7303 Prediabetes: Secondary | ICD-10-CM

## 2022-12-27 DIAGNOSIS — I5032 Chronic diastolic (congestive) heart failure: Secondary | ICD-10-CM | POA: Diagnosis not present

## 2022-12-27 DIAGNOSIS — C61 Malignant neoplasm of prostate: Secondary | ICD-10-CM

## 2022-12-28 LAB — CBC WITH DIFFERENTIAL/PLATELET
Basophils Absolute: 0 10*3/uL (ref 0.0–0.2)
Basos: 1 %
EOS (ABSOLUTE): 0.3 10*3/uL (ref 0.0–0.4)
Eos: 4 %
Hematocrit: 46.4 % (ref 37.5–51.0)
Hemoglobin: 15.1 g/dL (ref 13.0–17.7)
Immature Grans (Abs): 0 10*3/uL (ref 0.0–0.1)
Immature Granulocytes: 0 %
Lymphocytes Absolute: 2.9 10*3/uL (ref 0.7–3.1)
Lymphs: 39 %
MCH: 30.9 pg (ref 26.6–33.0)
MCHC: 32.5 g/dL (ref 31.5–35.7)
MCV: 95 fL (ref 79–97)
Monocytes Absolute: 0.5 10*3/uL (ref 0.1–0.9)
Monocytes: 7 %
Neutrophils Absolute: 3.8 10*3/uL (ref 1.4–7.0)
Neutrophils: 49 %
Platelets: 146 10*3/uL — ABNORMAL LOW (ref 150–450)
RBC: 4.88 x10E6/uL (ref 4.14–5.80)
RDW: 13.4 % (ref 11.6–15.4)
WBC: 7.6 10*3/uL (ref 3.4–10.8)

## 2022-12-28 LAB — IRON,TIBC AND FERRITIN PANEL
Ferritin: 869 ng/mL — ABNORMAL HIGH (ref 30–400)
Iron Saturation: 33 % (ref 15–55)
Iron: 114 ug/dL (ref 38–169)
Total Iron Binding Capacity: 345 ug/dL (ref 250–450)
UIBC: 231 ug/dL (ref 111–343)

## 2022-12-28 LAB — LIPID PANEL
Chol/HDL Ratio: 3.3 {ratio} (ref 0.0–5.0)
Cholesterol, Total: 136 mg/dL (ref 100–199)
HDL: 41 mg/dL (ref >39)
LDL Chol Calc (NIH): 74 mg/dL (ref 0–99)
Triglycerides: 116 mg/dL (ref 0–149)
VLDL Cholesterol Cal: 21 mg/dL (ref 5–40)

## 2022-12-28 LAB — COMPREHENSIVE METABOLIC PANEL
ALT: 48 [IU]/L — ABNORMAL HIGH (ref 0–44)
AST: 46 [IU]/L — ABNORMAL HIGH (ref 0–40)
Albumin: 4 g/dL (ref 3.9–4.9)
Alkaline Phosphatase: 127 [IU]/L — ABNORMAL HIGH (ref 44–121)
BUN/Creatinine Ratio: 8 — ABNORMAL LOW (ref 10–24)
BUN: 9 mg/dL (ref 8–27)
Bilirubin Total: 0.6 mg/dL (ref 0.0–1.2)
CO2: 27 mmol/L (ref 20–29)
Calcium: 8.7 mg/dL (ref 8.6–10.2)
Chloride: 101 mmol/L (ref 96–106)
Creatinine, Ser: 1.06 mg/dL (ref 0.76–1.27)
Globulin, Total: 3 g/dL (ref 1.5–4.5)
Glucose: 98 mg/dL (ref 70–99)
Potassium: 4.2 mmol/L (ref 3.5–5.2)
Sodium: 142 mmol/L (ref 134–144)
Total Protein: 7 g/dL (ref 6.0–8.5)
eGFR: 79 mL/min/{1.73_m2} (ref >59)

## 2022-12-28 LAB — B12 AND FOLATE PANEL
Folate: 11.3 ng/mL (ref >3.0)
Vitamin B-12: 1405 pg/mL — ABNORMAL HIGH (ref 232–1245)

## 2022-12-28 LAB — PSA: Prostate Specific Ag, Serum: 3.8 ng/mL (ref 0.0–4.0)

## 2022-12-28 LAB — VITAMIN D 25 HYDROXY (VIT D DEFICIENCY, FRACTURES): Vit D, 25-Hydroxy: 55 ng/mL (ref 30.0–100.0)

## 2022-12-28 LAB — HEMOGLOBIN A1C
Est. average glucose Bld gHb Est-mCnc: 123 mg/dL
Hgb A1c MFr Bld: 5.9 % — ABNORMAL HIGH (ref 4.8–5.6)

## 2022-12-28 LAB — TSH: TSH: 2.99 u[IU]/mL (ref 0.450–4.500)

## 2022-12-28 LAB — LEVETIRACETAM LEVEL: Levetiracetam Lvl: 14.6 ug/mL (ref 10.0–40.0)

## 2022-12-28 NOTE — Progress Notes (Signed)
Established Patient Office Visit  Subjective:  Patient ID: Randall Rojas, male    DOB: 08/23/1960  Age: 62 y.o. MRN: [id number]  CC:  Chief Complaint  Patient presents with   Medical Management of Chronic Issues    HPI Randall Rojas presents for follow up of chronic medical issues   Mixed hyperlipidemia  Pt presents with hyperlipidemia. Compliance with treatment has been fair; The patient maintains a low cholesterol diet , follows up as directed ,  The patient denies experiencing any hypercholesterolemia related symptoms. -- he also has coronary artery disease - follows with Dr Servando Salina (cardiology) regularly Currently on crestor 10mg  every day as well as asa 81mg  every day and ranexa  Pt has history of edema - he takes bumex 2mg  prn swelling along with potassium - states symptoms are stable at this time and having no problems  Pt continues to follow with neurology for history of headaches , history of TIA - he does follow up with them regularly -- provider amy Mariam Dollar NP He is currently taking aimovig, and keppra,   Pt  has anxiety/depression and takes zoloft and buspar.  He does see a counselor - states symptoms are stable at this tiem  Pt with GERD - pt using omeprazole 40mg  as directed  Pt withVit D def - taking weekly supplement - due for labwork  Pt has vit B12 def - taking otc supplement - due for labwork  Pt was recently diagnosed with prostate cancer - he is following with Dr Saddie Benders - he is currently on proscar 5mg  qd  Pt with coronary artery disease and chronic heart failure - he follows with Dr Servando Salina regularly -  Takes Ranexa 500mg   Denies chest pain/dyspnea Past Medical History:  Diagnosis Date   Acute laryngopharyngitis 11/11/2020   Acute URI 12/12/2019   Anxiety 06/17/2020   Arthralgia 06/17/2020   Asthma    Bilateral leg edema 06/23/2020   Chest pain 04/15/2015   Chronic diastolic heart failure (HCC) 08/05/2020   Chronic heart failure with preserved ejection  fraction (HCC) 07/11/2020   Coronary artery calcification seen on CT scan 04/15/2015   Coronary artery disease    Coronary artery disease involving native coronary artery of native heart without angina pectoris 07/11/2020   Coronary artery disease of native artery of native heart with stable angina pectoris (HCC) 06/23/2020   Essential hypertension 04/15/2015   Exertional dyspnea 05/26/2020   Frequent headaches 07/20/2020   GERD (gastroesophageal reflux disease) 05/27/2019   History of CVA in adulthood 08/05/2020   HLD (hyperlipidemia) 07/11/2020   Hyperglycemia 06/06/2015   Hyperlipidemia 04/15/2015   Hypertension 08/05/2020   Hypokalemia 07/20/2020   Left sided numbness 07/25/2020   Leukocytosis 06/06/2015   Malaise 06/17/2020   Mixed hyperlipidemia 04/15/2015   Morbid obesity (HCC) 04/15/2015   Need for prophylactic vaccination against Streptococcus pneumoniae (pneumococcus) 12/03/2019   Need for prophylactic vaccination and inoculation against influenza 12/03/2019   Need for tetanus booster 06/17/2020   Nonepileptic episode (HCC) 10/20/2020   Obesity, Class III, BMI 40-49.9 (morbid obesity) (HCC) 04/15/2015   Other sleep apnea 07/20/2020   Palpitations 08/05/2020   Pneumonia due to COVID-19 virus 04/06/2019   Prediabetes 06/23/2020   Prostate cancer screening 06/02/2019   Rectal bleeding 11/01/2020   Seizure-like activity (HCC) 05/26/2020   Shortness of breath 06/23/2020   TIA (transient ischemic attack)    Upper extremity weakness 05/26/2020   Weakness of left lower extremity 05/26/2020    Past Surgical History:  Procedure Laterality Date   CARDIAC CATHETERIZATION N/A 04/22/2015   Procedure: Left Heart Cath and Coronary Angiography;  Surgeon: Peter M Swaziland, MD;  Location: [facility] INVASIVE CV LAB;  Service: Cardiovascular;  Laterality: N/A;   GALLBLADDER SURGERY     LEFT HEART CATH AND CORONARY ANGIOGRAPHY N/A 10/28/2020   Procedure: LEFT HEART CATH AND CORONARY ANGIOGRAPHY;  Surgeon: Lennette Bihari, MD;  Location: [facility]  INVASIVE CV LAB;  Service: Cardiovascular;  Laterality: N/A;   SINUS SURGERY WITH INSTATRAK      Family History  Problem Relation Age of Onset   Hypertension Mother    Rheum arthritis Mother    Heart attack Father    Prostate cancer Father        METS TO BRAIN; H/O SKIN CANCER   Hypertension Sister    Heart disease Sister    Heart attack Sister    Hypertension Sister    Prostate cancer Brother        METS TO BONE AND LIVER   Breast cancer Paternal Aunt    Prostate cancer Paternal Uncle        METS TO LYMPH NODES   Cancer Paternal Uncle        UNKNOWN PRIMARY ORAL MALIGNANCY   Breast cancer Cousin     Social History   Socioeconomic History   Marital status: Married    Spouse name: [name]   Number of children: 3   Years of education: 12   Highest education level: Not on file  Occupational History   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   Occupation: DISABLED  Tobacco Use   Smoking status: Former    Current packs/day: 0.00    Types: Cigarettes    Quit date: 04/13/1989    Years since quitting: 33.7   Smokeless tobacco: Never  Vaping Use   Vaping status: Never Used  Substance and Sexual Activity   Alcohol use: Never   Drug use: Never   Sexual activity: Not Currently  Other Topics Concern   Not on file  Social History Narrative   Epworth Sleepiness Scale = 7 (as of 1/18/207)   Social Determinants of Health   Financial Resource Strain: High Risk (06/17/2021)   Overall Financial Resource Strain (CARDIA)    Difficulty of Paying Living Expenses: Hard  Food Insecurity: Food Insecurity Present (10/20/2022)   Hunger Vital Sign    Worried About Running Out of Food in the Last Year: Often true    Ran Out of Food in the Last Year: Sometimes true  Transportation Needs: No Transportation Needs (10/20/2022)   PRAPARE - Administrator, Civil Service (Medical): No    Lack of Transportation (Non-Medical): No  Physical Activity: Not on file  Stress: Not on file  Social  Connections: Not on file  Intimate Partner Violence: Not At Risk (10/20/2022)   Humiliation, Afraid, Rape, and Kick questionnaire    Fear of Current or Ex-Partner: No    Emotionally Abused: No    Physically Abused: No    Sexually Abused: No     Current Outpatient Medications:    acetaminophen (TYLENOL) 500 MG tablet, Take by mouth., Disp: , Rfl:    amitriptyline (ELAVIL) 50 MG tablet, TAKE ONE TABLET BY MOUTH AT BEDTIME, Disp: 30 tablet, Rfl: 1   baclofen (LIORESAL) 10 MG tablet, Take 10-20 tablets by mouth every 6 (six) hours as needed (pain)., Disp: , Rfl:    benzonatate (TESSALON) 200 MG capsule, Take 1 capsule (200 mg total) by mouth  3 (three) times daily as needed for cough., Disp: 30 capsule, Rfl: 1   blood glucose meter kit and supplies KIT, Dispense based on patient and insurance preference. Use up to four times daily as directed., Disp: 1 each, Rfl: 0   bumetanide (BUMEX) 2 MG tablet, Take 1 tablet by mouth twice daily., Disp: 180 tablet, Rfl: 3   busPIRone (BUSPAR) 30 MG tablet, Take 30 mg by mouth 2 (two) times daily., Disp: , Rfl:    famotidine (PEPCID) 40 MG tablet, Take 40 mg by mouth daily., Disp: , Rfl:    finasteride (PROSCAR) 5 MG tablet, Take 5 mg by mouth daily., Disp: , Rfl:    fluticasone (FLONASE) 50 MCG/ACT nasal spray, Place into the nose., Disp: , Rfl:    GOODSENSE ASPIRIN 81 MG chewable tablet, Chew 81 mg by mouth daily., Disp: , Rfl:    ipratropium-albuterol (DUONEB) 0.5-2.5 (3) MG/3ML SOLN, Inhale into the lungs., Disp: , Rfl:    ketoconazole (NIZORAL) 2 % shampoo, Apply topically 2 (two) times a week., Disp: 120 mL, Rfl: 1   levETIRAcetam (KEPPRA) 750 MG tablet, Take 1,500 mg by mouth 2 (two) times daily., Disp: , Rfl:    meclizine (ANTIVERT) 25 MG tablet, Take 25 mg by mouth 3 (three) times daily as needed., Disp: , Rfl:    Misc Natural Products (URINOZINC PLUS PO), Take by mouth daily., Disp: , Rfl:    montelukast (SINGULAIR) 10 MG tablet, Take 10 mg by mouth  daily., Disp: , Rfl:    nitroGLYCERIN (NITROSTAT) 0.4 MG SL tablet, Place 1 tablet (0.4 mg total) under the tongue every 5 (five) minutes as needed for chest pain. Up to three times., Disp: 45 tablet, Rfl: 3   omeprazole (PRILOSEC) 40 MG capsule, Take 1 capsule (40 mg total) by mouth daily., Disp: 90 capsule, Rfl: 1   OXYGEN, Inhale 2 L into the lungs daily as needed., Disp: , Rfl:    potassium chloride SA (KLOR-CON M) 20 MEQ tablet, Take 1 tablet by mouth twice daily., Disp: 180 tablet, Rfl: 3   promethazine (PHENERGAN) 25 MG tablet, Take 1 tablet (25 mg total) by mouth every 6 (six) hours as needed for nausea or vomiting., Disp: 30 tablet, Rfl: 0   ranolazine (RANEXA) 500 MG 12 hr tablet, Take 1 tablet by mouth twice daily., Disp: 180 tablet, Rfl: 3   rosuvastatin (CRESTOR) 10 MG tablet, TAKE 1 TABLET BY MOUTH ONCE DAILY., Disp: 90 tablet, Rfl: 3   sertraline (ZOLOFT) 100 MG tablet, Take 200 mg by mouth daily., Disp: , Rfl:    tadalafil (CIALIS) 20 MG tablet, SMARTSIG:1 Tablet(s) By Mouth, Disp: , Rfl:    VENTOLIN HFA 108 (90 Base) MCG/ACT inhaler, INHALE TWO PUFFS INTO THE LUNGS EVERY 6 HOURS AS NEEDED FOR WHEEZING OR SHORTNESS OF BREATH, Disp: 18 g, Rfl: 1   vitamin B-12 (CYANOCOBALAMIN) 1000 MCG tablet, Take 1,000 mcg by mouth daily., Disp: , Rfl:    Vitamin D, Ergocalciferol, 50000 units CAPS, Take 1 capsule by mouth once every 7 days as directed, Disp: 12 capsule, Rfl: 3   Allergies  Allergen Reactions   Cephalexin Nausea And Vomiting, Rash and Other (See Comments)   Ubrogepant Swelling and Other (See Comments)   CONSTITUTIONAL: Negative for chills, fatigue, fever, unintentional weight gain and unintentional weight loss.  E/N/T: Negative for ear pain, nasal congestion and sore throat.  CARDIOVASCULAR: Negative for chest pain, dizziness, palpitations and pedal edema.  RESPIRATORY: Negative for recent cough and dyspnea.  GASTROINTESTINAL: Negative for abdominal pain, acid reflux  symptoms, constipation, diarrhea, nausea and vomiting.  MSK: Negative for arthralgias and myalgias.  INTEGUMENTARY: Negative for rash.  NEUROLOGICAL: Negative for dizziness and headaches.  PSYCHIATRIC: Negative for sleep disturbance and to question depression screen.  Negative for depression, negative for anhedonia.       Objective:  PHYSICAL EXAM:   VS: BP 118/82 (BP Location: Left Arm, Patient Position: Sitting, Cuff Size: Large)   Pulse 83   Temp (!) 97.3 F (36.3 C) (Temporal)   Ht 5\' 4"  (1.626 m)   Wt (!) 321 lb (145.6 kg)   SpO2 94%   BMI 55.10 kg/m   GEN: Well nourished, well developed, in no acute distress  HEENT: normal external ears and nose - normal external auditory canals and TMS - hearing grossly normal - - Lips, Teeth and Gums - normal  Oropharynx - normal mucosa, palate, and posterior pharynx Neck: no JVD or masses - no thyromegaly Cardiac: RRR; no murmurs, rubs, or gallops,no edema -  Respiratory:  normal respiratory rate and pattern with no distress - normal breath sounds with no rales, rhonchi, wheezes or rubs GI: normal bowel sounds, no masses or tenderness MS: no deformity or atrophy  Skin: warm and dry, no rash  Neuro:  Alert and Oriented x 3,  - CN II-Xii grossly intact Psych: euthymic mood, appropriate affect and demeanor    There are no preventive care reminders to display for this patient.  Lab Results  Component Value Date   TSH 2.120 06/14/2022   Lab Results  Component Value Date   WBC 7.0 11/10/2022   HGB 14.5 11/10/2022   HCT 44.8 11/10/2022   MCV 97.0 11/10/2022   PLT 148 (L) 11/10/2022   Lab Results  Component Value Date   NA 141 10/12/2022   K 4.4 10/12/2022   CO2 25 10/12/2022   GLUCOSE 124 (H) 10/12/2022   BUN 8 10/12/2022   CREATININE 0.95 10/12/2022   BILITOT 0.4 10/12/2022   ALKPHOS 124 (H) 10/12/2022   AST 36 10/12/2022   ALT 40 10/12/2022   PROT 6.4 10/12/2022   ALBUMIN 3.8 (L) 10/12/2022   CALCIUM 8.9 10/12/2022    ANIONGAP 11 04/07/2019   EGFR 91 10/12/2022   Lab Results  Component Value Date   CHOL 130 09/21/2022   Lab Results  Component Value Date   HDL 42 09/21/2022   Lab Results  Component Value Date   LDLCALC 63 09/21/2022   Lab Results  Component Value Date   TRIG 146 09/21/2022   Lab Results  Component Value Date   CHOLHDL 3.1 09/21/2022   Lab Results  Component Value Date   HGBA1C 6.0 (H) 09/21/2022      Assessment & Plan:   Problem List Items Addressed This Visit                                    Mixed hyperlipidemia   Relevant Medications   isosorbide mononitrate (IMDUR) 15 mg TB24 24 hr tablet Continue crestor 10mg  qd   Other Relevant Orders   Lipid panel Continue meds Watch diet    Seizure like activity (HCC) Continue meds and follow up with neurology as directed Continue ASA  GERD Rx for omeprazole as directed   Coronary artery disease with stable angina pectoris (HCC) Continue meds as directed Labwork pending Follow up with cardiology as directed  Chronic diastolic  heart failure (HCC) Continue meds  Continue follow up with cardiology  Anxiety Continue buspar , elavil and zoloft as directed  Vit D def Continue supplement Labwork pending    Prostate cancer (HCC) Follow up with urology as directed                        No orders of the defined types were placed in this encounter.    Follow-up: Return in about 4 months (around 04/29/2023) for chronic fasting follow-up- also MCR wellness with nurse.    SARA R Chenae Brager, PA-C

## 2023-01-03 ENCOUNTER — Ambulatory Visit: Payer: Medicare Other | Attending: Cardiology | Admitting: Cardiology

## 2023-01-03 ENCOUNTER — Encounter: Payer: Self-pay | Admitting: Cardiology

## 2023-01-03 ENCOUNTER — Encounter: Payer: Self-pay | Admitting: Physician Assistant

## 2023-01-03 VITALS — BP 118/88 | HR 82 | Ht 64.0 in | Wt 321.0 lb

## 2023-01-03 DIAGNOSIS — G459 Transient cerebral ischemic attack, unspecified: Secondary | ICD-10-CM | POA: Diagnosis not present

## 2023-01-03 DIAGNOSIS — R5383 Other fatigue: Secondary | ICD-10-CM | POA: Diagnosis not present

## 2023-01-03 DIAGNOSIS — R400 Somnolence: Secondary | ICD-10-CM | POA: Diagnosis not present

## 2023-01-03 DIAGNOSIS — I10 Essential (primary) hypertension: Secondary | ICD-10-CM

## 2023-01-03 DIAGNOSIS — G4733 Obstructive sleep apnea (adult) (pediatric): Secondary | ICD-10-CM | POA: Diagnosis not present

## 2023-01-03 DIAGNOSIS — I5032 Chronic diastolic (congestive) heart failure: Secondary | ICD-10-CM | POA: Diagnosis not present

## 2023-01-03 DIAGNOSIS — J4540 Moderate persistent asthma, uncomplicated: Secondary | ICD-10-CM | POA: Diagnosis not present

## 2023-01-03 DIAGNOSIS — I2510 Atherosclerotic heart disease of native coronary artery without angina pectoris: Secondary | ICD-10-CM | POA: Diagnosis not present

## 2023-01-03 DIAGNOSIS — Z6841 Body Mass Index (BMI) 40.0 and over, adult: Secondary | ICD-10-CM

## 2023-01-03 NOTE — Patient Instructions (Signed)
Medication Instructions:  Your physician recommends that you continue on your current medications as directed. Please refer to the Current Medication list given to you today.  *If you need a refill on your cardiac medications before your next appointment, please call your pharmacy*   Lab Work: None   Testing/Procedures: None   Follow-Up: At [facility], you and your health needs are our priority.  As part of our continuing mission to provide you with exceptional heart care, we have created designated Provider Care Teams.  These Care Teams include your primary Cardiologist (physician) and Advanced Practice Providers (APPs -  Physician Assistants and Nurse Practitioners) who all work together to provide you with the care you need, when you need it.   Your next appointment:   9 month(s)  Provider:   Kardie Tobb, DO   

## 2023-01-04 NOTE — Progress Notes (Signed)
Cardiology Office Note:    Date:  01/04/2023   ID:  Randall Rojas, DOB 08/24/1960, MRN [id number]  PCP:  Marianne Sofia, PA-C  Cardiologist:  Thomasene Ripple, DO  Electrophysiologist:  None   Referring MD: Marianne Sofia, PA-C   " I am ok"  History of Present Illness:    Randall Rojas is a 61 y.o. male with a hx of  coronary artery disease, TIA , hypertension, hyperlipidemia, chronic diastolic heart failure with his recent EF in May 2022 55-60%, morbid obesity, sever OSA on cpap.   Here today for a follow up visit. The patient is currently experiencing vertigo, which has been significantly impacting his quality of life. He describes feeling as if he is about to fall if he looks up or down without something to hold onto.  He reports having only one or two headaches since the first round of treatment.    Past Medical History:  Diagnosis Date   Acute laryngopharyngitis 11/11/2020   Acute URI 12/12/2019   Anxiety 06/17/2020   Arthralgia 06/17/2020   Asthma    Bilateral leg edema 06/23/2020   Chest pain 04/15/2015   Chronic diastolic heart failure (HCC) 08/05/2020   Chronic heart failure with preserved ejection fraction (HCC) 07/11/2020   Coronary artery calcification seen on CT scan 04/15/2015   Coronary artery disease    Coronary artery disease involving native coronary artery of native heart without angina pectoris 07/11/2020   Coronary artery disease of native artery of native heart with stable angina pectoris (HCC) 06/23/2020   Essential hypertension 04/15/2015   Exertional dyspnea 05/26/2020   Frequent headaches 07/20/2020   GERD (gastroesophageal reflux disease) 05/27/2019   History of CVA in adulthood 08/05/2020   HLD (hyperlipidemia) 07/11/2020   Hyperglycemia 06/06/2015   Hyperlipidemia 04/15/2015   Hypertension 08/05/2020   Hypokalemia 07/20/2020   Left sided numbness 07/25/2020   Leukocytosis 06/06/2015   Malaise 06/17/2020   Mixed hyperlipidemia 04/15/2015   Morbid obesity (HCC) 04/15/2015    Need for prophylactic vaccination against Streptococcus pneumoniae (pneumococcus) 12/03/2019   Need for prophylactic vaccination and inoculation against influenza 12/03/2019   Need for tetanus booster 06/17/2020   Nonepileptic episode (HCC) 10/20/2020   Obesity, Class III, BMI 40-49.9 (morbid obesity) (HCC) 04/15/2015   Other sleep apnea 07/20/2020   Palpitations 08/05/2020   Pneumonia due to COVID-19 virus 04/06/2019   Prediabetes 06/23/2020   Prostate cancer screening 06/02/2019   Rectal bleeding 11/01/2020   Seizure-like activity (HCC) 05/26/2020   Shortness of breath 06/23/2020   TIA (transient ischemic attack)    Upper extremity weakness 05/26/2020   Weakness of left lower extremity 05/26/2020    Past Surgical History:  Procedure Laterality Date   CARDIAC CATHETERIZATION N/A 04/22/2015   Procedure: Left Heart Cath and Coronary Angiography;  Surgeon: Peter M Swaziland, MD;  Location: [facility] INVASIVE CV LAB;  Service: Cardiovascular;  Laterality: N/A;   GALLBLADDER SURGERY     LEFT HEART CATH AND CORONARY ANGIOGRAPHY N/A 10/28/2020   Procedure: LEFT HEART CATH AND CORONARY ANGIOGRAPHY;  Surgeon: Lennette Bihari, MD;  Location: [facility] INVASIVE CV LAB;  Service: Cardiovascular;  Laterality: N/A;   SINUS SURGERY WITH INSTATRAK      Current Medications: Current Meds  Medication Sig   acetaminophen (TYLENOL) 500 MG tablet Take by mouth.   amitriptyline (ELAVIL) 50 MG tablet TAKE ONE TABLET BY MOUTH AT BEDTIME   baclofen (LIORESAL) 10 MG tablet Take 10-20 tablets by mouth every 6 (six) hours as needed (  pain).   benzonatate (TESSALON) 200 MG capsule Take 1 capsule (200 mg total) by mouth 3 (three) times daily as needed for cough.   blood glucose meter kit and supplies KIT Dispense based on patient and insurance preference. Use up to four times daily as directed.   bumetanide (BUMEX) 2 MG tablet Take 1 tablet by mouth twice daily.   busPIRone (BUSPAR) 30 MG tablet Take 30 mg by mouth 2 (two) times daily.   famotidine  (PEPCID) 40 MG tablet Take 40 mg by mouth daily.   finasteride (PROSCAR) 5 MG tablet Take 5 mg by mouth daily.   fluticasone (FLONASE) 50 MCG/ACT nasal spray Place into the nose.   GOODSENSE ASPIRIN 81 MG chewable tablet Chew 81 mg by mouth daily.   ipratropium-albuterol (DUONEB) 0.5-2.5 (3) MG/3ML SOLN Inhale into the lungs.   ketoconazole (NIZORAL) 2 % shampoo Apply topically 2 (two) times a week.   levETIRAcetam (KEPPRA) 750 MG tablet Take 1,500 mg by mouth 2 (two) times daily.   meclizine (ANTIVERT) 25 MG tablet Take 25 mg by mouth 3 (three) times daily as needed.   Misc Natural Products (URINOZINC PLUS PO) Take by mouth daily.   montelukast (SINGULAIR) 10 MG tablet Take 10 mg by mouth daily.   nitroGLYCERIN (NITROSTAT) 0.4 MG SL tablet Place 1 tablet (0.4 mg total) under the tongue every 5 (five) minutes as needed for chest pain. Up to three times.   omeprazole (PRILOSEC) 40 MG capsule Take 1 capsule (40 mg total) by mouth daily.   OXYGEN Inhale 2 L into the lungs daily as needed.   potassium chloride SA (KLOR-CON M) 20 MEQ tablet Take 1 tablet by mouth twice daily.   promethazine (PHENERGAN) 25 MG tablet Take 1 tablet (25 mg total) by mouth every 6 (six) hours as needed for nausea or vomiting.   ranolazine (RANEXA) 500 MG 12 hr tablet Take 1 tablet by mouth twice daily.   rosuvastatin (CRESTOR) 10 MG tablet TAKE 1 TABLET BY MOUTH ONCE DAILY.   sertraline (ZOLOFT) 100 MG tablet Take 200 mg by mouth daily.   tadalafil (CIALIS) 20 MG tablet SMARTSIG:1 Tablet(s) By Mouth   VENTOLIN HFA 108 (90 Base) MCG/ACT inhaler INHALE TWO PUFFS INTO THE LUNGS EVERY 6 HOURS AS NEEDED FOR WHEEZING OR SHORTNESS OF BREATH   vitamin B-12 (CYANOCOBALAMIN) 1000 MCG tablet Take 1,000 mcg by mouth daily.   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   Occupation: DISABLED  Tobacco Use   Smoking status: Former    Current packs/day: 0.00    Types: Cigarettes    Quit date: 04/13/1989    Years since quitting: 33.7   Smokeless tobacco: Never  Vaping Use   Vaping status: Never Used  Substance and Sexual Activity   Alcohol use: Never   Drug use: Never   Sexual activity: Not Currently  Other Topics Concern   Not on file  Social History Narrative   Epworth Sleepiness Scale = 7 (as of 1/18/207)   Social Determinants of Health   Financial Resource Strain: High Risk (06/17/2021)   Overall Financial  Resource Strain (CARDIA)    Difficulty of Paying Living Expenses: Hard  Food Insecurity: Food Insecurity Present (10/20/2022)   Hunger Vital Sign    Worried About Running Out of Food in the Last Year: Often true    Ran Out of Food in the Last Year: Sometimes true  Transportation Needs: No Transportation Needs (10/20/2022)   PRAPARE - Administrator, Civil Service (Medical): No    Lack of Transportation (Non-Medical): No  Physical Activity: Not on file  Stress: Not on file  Social Connections: Not on file     Family History: The patient's family history includes Breast cancer in his cousin and paternal aunt; Cancer in his paternal uncle; Heart attack in his father and sister; Heart disease in his sister; Hypertension in his mother, sister, and sister; Prostate cancer in his brother, father, and paternal uncle; Rheum arthritis in his mother.  ROS:   Review of Systems  Constitution: Negative for decreased appetite, fever and weight gain.  HENT: Negative for congestion, ear discharge, hoarse voice and sore throat.   Eyes: Negative for discharge, redness, vision loss in right eye and visual halos.  Cardiovascular: Negative for chest pain, dyspnea on exertion,  leg swelling, orthopnea and palpitations.  Respiratory: Negative for cough, hemoptysis, shortness of breath and snoring.   Endocrine: Negative for heat intolerance and polyphagia.  Hematologic/Lymphatic: Negative for bleeding problem. Does not bruise/bleed easily.  Skin: Negative for flushing, nail changes, rash and suspicious lesions.  Musculoskeletal: Negative for arthritis, joint pain, muscle cramps, myalgias, neck pain and stiffness.  Gastrointestinal: Negative for abdominal pain, bowel incontinence, diarrhea and excessive appetite.  Genitourinary: Negative for decreased libido, genital sores and incomplete emptying.  Neurological: Negative for brief paralysis, focal weakness, headaches and loss of balance.  Psychiatric/Behavioral: Negative for altered mental status, depression and suicidal ideas.  Allergic/Immunologic: Negative for HIV exposure and persistent infections.    EKGs/Labs/Other Studies Reviewed:    The following studies were reviewed today:   EKG:  The ekg ordered today demonstrates sinus rhythm HR 76 bpm.  Left heart catheterization October 28, 2020 1st Diag lesion is 40% stenosed.   Mid LAD lesion is 10% stenosed.   The left ventricular systolic function is normal.   LV end diastolic pressure is mildly elevated.   The left ventricular ejection fraction is 50-55% by visual estimate.  Mild nonobstructive CAD with 40% proximal stenosis in the first diagonal branch of the LAD with mild smooth luminal 10% mid LAD narrowing; normal left circumflex coronary artery; normal dominant RCA.   Low normal LV function with EF estimated 50 to 55% without focal segmental wall motion abnormality.  LVEDP 21 mm.   RECOMMENDATION: Medical therapy for mild nonobstructive CAD.  Aggressive lipid-lowering therapy with target LDL less than 70.    ZIO monitor Aug 13, 2020 Patch Wear Time:  3 days and 10 hours starting Aug 05, 2020 Indication: Palpitations   Patient had a min HR of 51 bpm,  max HR of 120 bpm, and avg HR of 83 bpm.    Predominant underlying rhythm was Sinus Rhythm.   Premature ventricular complexes were rare.   Premature atrial complex were rare.   No ventricular tachycardia, no pauses, no supraventricular tachycardia no atrial fibrillation noted.   Symptoms associated with sinus rhythm.   Conclusion: Normal/unremarkable study with no evidence of significant arrhythmia  Recent Labs: 12/27/2022: ALT 48; BUN 9; Creatinine, Ser 1.06; Hemoglobin 15.1; Platelets 146; Potassium 4.2; Sodium 142; TSH 2.990  Recent  Lipid Panel    Component Value Date/Time   CHOL 136 12/27/2022 1002   TRIG 116 12/27/2022 1002   HDL 41 12/27/2022 1002   CHOLHDL 3.3 12/27/2022 1002   CHOLHDL 6.2 04/07/2019 0334   VLDL 16 04/07/2019 0334   LDLCALC 74 12/27/2022 1002    Physical Exam:    VS:  BP 118/88 (BP Location: Left Arm, Patient Position: Sitting, Cuff Size: Large)   Pulse 82   Ht 5\' 4"  (1.626 m)   Wt (!) 321 lb (145.6 kg)   SpO2 94%   BMI 55.10 kg/m     Wt Readings from Last 3 Encounters:  01/03/23 (!) 321 lb (145.6 kg)  12/27/22 (!) 321 lb (145.6 kg)  11/30/22 (!) 315 lb 11.2 oz (143.2 kg)     GEN: Well nourished, well developed in no acute distress HEENT: Normal NECK: No JVD; No carotid bruits LYMPHATICS: No lymphadenopathy CARDIAC: S1S2 noted,RRR, no murmurs, rubs, gallops RESPIRATORY:  Clear to auscultation without rales, wheezing or rhonchi  ABDOMEN: Soft, non-tender, non-distended, +bowel sounds, no guarding. EXTREMITIES: No edema, No cyanosis, no clubbing MUSCULOSKELETAL:  No deformity  SKIN: Warm and dry NEUROLOGIC:  Alert and oriented x 3, non-focal PSYCHIATRIC:  Normal affect, good insight  ASSESSMENT:    1. TIA (transient ischemic attack)   2. Essential hypertension   3. Coronary artery disease involving native coronary artery of native heart without angina pectoris   4. Primary hypertension   5. Chronic diastolic heart failure (HCC)   6.  Morbid obesity with BMI of 50.0-59.9, adult (HCC)       PLAN:    Coronary Artery Disease,  Hypertension  Hyperlipidemia  chronic diastolic Heart Failure Obstructive Sleep Apnea:  Stable cardiovascular status. Patient cleared for use of Viagra by urologist. -Continue current management.      Obesity Discussed potential use of Wegovy for weight loss after completion of upcoming upper GI and colonoscopy. -PCP planning initiation of Wegovy after completion of GI procedures  The patient is in agreement with the above plan. The patient left the office in stable condition.  The patient will follow up in    Medication Adjustments/Labs and Tests Ordered: Current medicines are reviewed at length with the patient today.  Concerns regarding medicines are outlined above.  No orders of the defined types were placed in this encounter.  No orders of the defined types were placed in this encounter.   Patient Instructions  Medication Instructions:  Your physician recommends that you continue on your current medications as directed. Please refer to the Current Medication list given to you today.  *If you need a refill on your cardiac medications before your next appointment, please call your pharmacy*   Lab Work: None  Testing/Procedures: None   Follow-Up: At [facility], you and your health needs are our priority.  As part of our continuing mission to provide you with exceptional heart care, we have created designated Provider Care Teams.  These Care Teams include your primary Cardiologist (physician) and Advanced Practice Providers (APPs -  Physician Assistants and Nurse Practitioners) who all work together to provide you with the care you need, when you need it.  Your next appointment:   9 month(s)  Provider:   Thomasene Ripple, DO       Adopting a Healthy Lifestyle.  Know what a healthy weight is for you (roughly BMI <25) and aim to maintain this   Aim for 7+  servings of fruits and vegetables daily  65-80+ fluid ounces of water or unsweet tea for healthy kidneys   Limit to max 1 drink of alcohol per day; avoid smoking/tobacco   Limit animal fats in diet for cholesterol and heart health - choose grass fed whenever available   Avoid highly processed foods, and foods high in saturated/trans fats   Aim for low stress - take time to unwind and care for your mental health   Aim for 150 min of moderate intensity exercise weekly for heart health, and weights twice weekly for bone health   Aim for 7-9 hours of sleep daily   When it comes to diets, agreement about the perfect plan isnt easy to find, even among the experts. Experts at the [facility]: Lambert Rhonda W of [employer] developed an idea known as the Healthy Eating Plate. Just imagine a plate divided into logical, healthy portions.   The emphasis is on diet quality:   Load up on vegetables and fruits - one-half of your plate: Aim for color and variety, and remember that potatoes dont count.   Go for whole grains - one-quarter of your plate: Whole wheat, barley, wheat berries, quinoa, oats, brown rice, and foods made with them. If you want pasta, go with whole wheat pasta.   Protein power - one-quarter of your plate: Fish, chicken, beans, and nuts are all healthy, versatile protein sources. Limit red meat.   The diet, however, does go beyond the plate, offering a few other suggestions.   Use healthy plant oils, such as olive, canola, soy, corn, sunflower and peanut. Check the labels, and avoid partially hydrogenated oil, which have unhealthy trans fats.   If youre thirsty, drink water. Coffee and tea are good in moderation, but skip sugary drinks and limit milk and dairy products to one or two daily servings.   The type of carbohydrate in the diet is more important than the amount. Some sources of carbohydrates, such as vegetables, fruits, whole grains, and beans-are healthier than others.    Finally, stay active  Signed, Thomasene Ripple, DO  01/04/2023 5:51 PM    [facility]

## 2023-01-08 ENCOUNTER — Inpatient Hospital Stay: Payer: Medicare Other | Attending: Oncology | Admitting: Oncology

## 2023-01-08 VITALS — BP 155/87 | HR 88 | Temp 98.1°F | Resp 18 | Ht 64.0 in | Wt 320.5 lb

## 2023-01-08 DIAGNOSIS — R7989 Other specified abnormal findings of blood chemistry: Secondary | ICD-10-CM | POA: Diagnosis not present

## 2023-01-08 NOTE — Progress Notes (Unsigned)
[facility]  [address] [place],  [place]  [address] [phone]  Clinic Day:  11/30/2022  Referring physician: Marianne Sofia, PA-C   HISTORY OF PRESENT ILLNESS:  The patient is a 62 y.o. male  who I recently began seeing for an elevated ferritin level. However, his other parameters within his iron panel were normal.  He comes in today to go over labs to determine if he has underlying hemochromatosis.  Of note, this gentleman does have multiple comorbidities, including congestive heart failure, cerebrovascular disease, coronary artery disease, and prostate cancer. Despite these multiple health issues, he denies having any acute changes in his health.  PHYSICAL EXAM:  There were no vitals taken for this visit. Wt Readings from Last 3 Encounters:  01/03/23 (!) 321 lb (145.6 kg)  12/27/22 (!) 321 lb (145.6 kg)  11/30/22 (!) 315 lb 11.2 oz (143.2 kg)   There is no height or weight on file to calculate BMI. Performance status (ECOG): 1 - Symptomatic but completely ambulatory Physical Exam Constitutional:      Appearance: Normal appearance. He is not ill-appearing.  HENT:     Mouth/Throat:     Mouth: Mucous membranes are moist.     Pharynx: Oropharynx is clear. No oropharyngeal exudate or posterior oropharyngeal erythema.  Cardiovascular:     Rate and Rhythm: Normal rate and regular rhythm.     Heart sounds: No murmur heard.    No friction rub. No gallop.  Pulmonary:     Effort: Pulmonary effort is normal. No respiratory distress.     Breath sounds: Normal breath sounds. No wheezing, rhonchi or rales.  Abdominal:     General: Bowel sounds are normal. There is no distension.     Palpations: Abdomen is soft. There is no mass.     Tenderness: There is no abdominal tenderness.  Musculoskeletal:        General: No swelling.     Right lower leg: No edema.     Left lower leg: No edema.  Lymphadenopathy:     Cervical: No cervical adenopathy.      Upper Body:     Right upper body: No supraclavicular or axillary adenopathy.     Left upper body: No supraclavicular or axillary adenopathy.     Lower Body: No right inguinal adenopathy. No left inguinal adenopathy.  Skin:    General: Skin is warm.     Coloration: Skin is not jaundiced.     Findings: No lesion or rash.  Neurological:     General: No focal deficit present.     Mental Status: He is alert and oriented to person, place, and time. Mental status is at baseline.  Psychiatric:        Mood and Affect: Mood normal.        Behavior: Behavior normal.        Thought Content: Thought content normal.    LABS:      Latest Ref Rng & Units 12/27/2022   10:02 AM 11/10/2022   11:48 AM 10/12/2022   10:02 AM  CBC  WBC 3.4 - 10.8 x10E3/uL 7.6  7.0  6.7   Hemoglobin 13.0 - 17.7 g/dL 21.3  08.6  57.8   Hematocrit 37.5 - 51.0 % 46.4  44.8  44.5   Platelets 150 - 450 x10E3/uL 146  148  139       Latest Ref Rng & Units 12/27/2022   10:02 AM 10/12/2022   10:02 AM 09/21/2022  9:57 AM  CMP  Glucose 70 - 99 mg/dL 98  413  98   BUN 8 - 27 mg/dL 9  8  11    Creatinine 0.76 - 1.27 mg/dL 2.44  0.10  2.72   Sodium 134 - 144 mmol/L 142  141  143   Potassium 3.5 - 5.2 mmol/L 4.2  4.4  4.5   Chloride 96 - 106 mmol/L 101  102  102   CO2 20 - 29 mmol/L 27  25  27    Calcium 8.6 - 10.2 mg/dL 8.7  8.9  8.9   Total Protein 6.0 - 8.5 g/dL 7.0  6.4  6.7   Total Bilirubin 0.0 - 1.2 mg/dL 0.6  0.4  0.4   Alkaline Phos 44 - 121 IU/L 127  124  106   AST 0 - 40 IU/L 46  36  43   ALT 0 - 44 IU/L 48  40  40     Latest Reference Range & Units 10/12/22 10:02  Iron 38 - 169 ug/dL 83  UIBC 536 - 644 ug/dL 034  TIBC 742 - 595 ug/dL 638  Ferritin 30 - 756 ng/mL 717 (H)  Iron Saturation 15 - 55 % 26  (H): Data is abnormally high  Hemochromatosis test results: c.845G>A (p.Cys282Tyr) - Not Detected  c.187C>G (p.His63Asp) - Not Detected  c.193A>T (p.Ser65Cys) - Not Detected  Not associated with increased  risk to develop clinical symptoms  of Hereditary Hemochromatosis. In symptomatic individuals, other  causes of iron overload should be evaluated.   ASSESSMENT & PLAN:  A 62 y.o. male with an elevated ferritin level.  In clinic today, I went over his hemochromatosis test results, which all came back negative.  Once again, I explained to the patient that his numerous other medical issues are likely what is triggering his elevated ferritin level.  He understands he need to make health/lifestyle changes to get many of his comorbidities under much better control.  Otherwise, as he has no pressing hematologic issues, I do feel comfortable turning his care back over to his primary care office.    The patient understands all the plans discussed today and is in agreement with them.    Gerri Acre Kirby Funk, MD

## 2023-01-22 ENCOUNTER — Ambulatory Visit: Payer: Medicaid Other | Admitting: Cardiology

## 2023-01-22 ENCOUNTER — Other Ambulatory Visit: Payer: Self-pay | Admitting: Physician Assistant

## 2023-01-22 ENCOUNTER — Other Ambulatory Visit: Payer: Self-pay | Admitting: Family Medicine

## 2023-01-22 DIAGNOSIS — R110 Nausea: Secondary | ICD-10-CM

## 2023-01-25 DIAGNOSIS — G43709 Chronic migraine without aura, not intractable, without status migrainosus: Secondary | ICD-10-CM | POA: Diagnosis not present

## 2023-01-30 DIAGNOSIS — I110 Hypertensive heart disease with heart failure: Secondary | ICD-10-CM | POA: Diagnosis not present

## 2023-01-30 DIAGNOSIS — K921 Melena: Secondary | ICD-10-CM | POA: Diagnosis not present

## 2023-01-30 DIAGNOSIS — Z8673 Personal history of transient ischemic attack (TIA), and cerebral infarction without residual deficits: Secondary | ICD-10-CM | POA: Diagnosis not present

## 2023-01-30 DIAGNOSIS — G4733 Obstructive sleep apnea (adult) (pediatric): Secondary | ICD-10-CM | POA: Diagnosis not present

## 2023-01-30 DIAGNOSIS — K219 Gastro-esophageal reflux disease without esophagitis: Secondary | ICD-10-CM | POA: Diagnosis not present

## 2023-01-30 DIAGNOSIS — I10 Essential (primary) hypertension: Secondary | ICD-10-CM | POA: Diagnosis not present

## 2023-01-30 DIAGNOSIS — Z800 Family history of malignant neoplasm of digestive organs: Secondary | ICD-10-CM | POA: Diagnosis not present

## 2023-01-30 DIAGNOSIS — J45909 Unspecified asthma, uncomplicated: Secondary | ICD-10-CM | POA: Diagnosis not present

## 2023-01-30 DIAGNOSIS — K449 Diaphragmatic hernia without obstruction or gangrene: Secondary | ICD-10-CM | POA: Diagnosis not present

## 2023-01-30 DIAGNOSIS — R569 Unspecified convulsions: Secondary | ICD-10-CM | POA: Diagnosis not present

## 2023-01-30 DIAGNOSIS — I509 Heart failure, unspecified: Secondary | ICD-10-CM | POA: Diagnosis not present

## 2023-01-30 LAB — HM COLONOSCOPY

## 2023-02-08 ENCOUNTER — Ambulatory Visit (INDEPENDENT_AMBULATORY_CARE_PROVIDER_SITE_OTHER): Payer: Medicare Other | Admitting: Physician Assistant

## 2023-02-08 ENCOUNTER — Encounter: Payer: Self-pay | Admitting: Specialist

## 2023-02-08 ENCOUNTER — Encounter: Payer: Self-pay | Admitting: Physician Assistant

## 2023-02-08 VITALS — BP 132/72 | HR 88 | Temp 97.1°F | Ht 64.0 in | Wt 320.0 lb

## 2023-02-08 DIAGNOSIS — Z0000 Encounter for general adult medical examination without abnormal findings: Secondary | ICD-10-CM

## 2023-02-08 NOTE — Progress Notes (Signed)
Subjective:    Randall Rojas is a 62 y.o. male who presents for a Welcome to Medicare exam.    CONSTITUTIONAL: Negative for chills, fatigue, fever, unintentional weight gain and unintentional weight loss.  E/N/T: Negative for ear pain, nasal congestion and sore throat.  CARDIOVASCULAR: Negative for chest pain, dizziness, palpitations and pedal edema.  RESPIRATORY: Negative for recent cough and dyspnea.  GASTROINTESTINAL: Negative for abdominal pain, acid reflux symptoms, constipation, diarrhea, nausea and vomiting.  MSK: Negative for arthralgias and myalgias.  INTEGUMENTARY: Negative for rash.  NEUROLOGICAL: Negative for dizziness and headaches.  PSYCHIATRIC: Negative for sleep disturbance and to question depression screen.  Negative for depression, negative for anhedonia.          Objective:    Today's Vitals   02/08/23 1112  BP: 132/72  Pulse: 88  Temp: (!) 97.1 F (36.2 C)  TempSrc: Temporal  SpO2: 93%  Weight: (!) 320 lb (145.2 kg)  Height: 5\' 4"  (1.626 m)   Body mass index is 54.93 kg/m.  Medications Outpatient Encounter Medications as of 02/08/2023  Medication Sig   acetaminophen (TYLENOL) 500 MG tablet Take by mouth.   amitriptyline (ELAVIL) 50 MG tablet TAKE ONE TABLET BY MOUTH AT BEDTIME   baclofen (LIORESAL) 10 MG tablet Take 10-20 tablets by mouth every 6 (six) hours as needed (pain).   benzonatate (TESSALON) 200 MG capsule Take 1 capsule (200 mg total) by mouth 3 (three) times daily as needed for cough.   blood glucose meter kit and supplies KIT Dispense based on patient and insurance preference. Use up to four times daily as directed.   bumetanide (BUMEX) 2 MG tablet Take 1 tablet by mouth twice daily.   busPIRone (BUSPAR) 30 MG tablet Take 30 mg by mouth 2 (two) times daily.   famotidine (PEPCID) 40 MG tablet Take 40 mg by mouth daily.   finasteride (PROSCAR) 5 MG tablet Take 5 mg by mouth daily.   GOODSENSE ASPIRIN 81 MG chewable tablet Chew 81 mg by  mouth daily.   ipratropium-albuterol (DUONEB) 0.5-2.5 (3) MG/3ML SOLN Inhale into the lungs.   ketoconazole (NIZORAL) 2 % shampoo Apply topically 2 (two) times a week.   levETIRAcetam (KEPPRA) 750 MG tablet Take 1,500 mg by mouth 2 (two) times daily.   meclizine (ANTIVERT) 25 MG tablet Take 25 mg by mouth 3 (three) times daily as needed.   Misc Natural Products (URINOZINC PLUS PO) Take by mouth daily.   montelukast (SINGULAIR) 10 MG tablet Take 10 mg by mouth daily.   nitroGLYCERIN (NITROSTAT) 0.4 MG SL tablet Place 1 tablet (0.4 mg total) under the tongue every 5 (five) minutes as needed for chest pain. Up to three times.   omeprazole (PRILOSEC) 40 MG capsule Take 1 capsule (40 mg total) by mouth daily.   OXYGEN Inhale 2 L into the lungs daily as needed.   potassium chloride SA (KLOR-CON M) 20 MEQ tablet Take 1 tablet by mouth twice daily.   promethazine (PHENERGAN) 25 MG tablet Take 1 tablet by mouth every 6 hours as needed for nausea or vomiting.   ranolazine (RANEXA) 500 MG 12 hr tablet Take 1 tablet by mouth twice daily.   rosuvastatin (CRESTOR) 10 MG tablet TAKE 1 TABLET BY MOUTH ONCE DAILY.   sertraline (ZOLOFT) 100 MG tablet Take 200 mg by mouth daily.   tadalafil (CIALIS) 20 MG tablet SMARTSIG:1 Tablet(s) By Mouth   VENTOLIN HFA 108 (90 Base) MCG/ACT inhaler INHALE TWO PUFFS INTO THE LUNGS EVERY  6 HOURS AS NEEDED FOR WHEEZING OR SHORTNESS OF BREATH   vitamin B-12 (CYANOCOBALAMIN) 1000 MCG tablet Take 1,000 mcg by mouth daily.   Vitamin D, Ergocalciferol, 50000 units CAPS Take 1 capsule by mouth once every 7 days as directed   fluticasone (FLONASE) 50 MCG/ACT nasal spray Place into the nose.   No facility-administered encounter medications on file as of 02/08/2023.     History: Past Medical History:  Diagnosis Date   Acute laryngopharyngitis 11/11/2020   Acute URI 12/12/2019   Anxiety 06/17/2020   Arthralgia 06/17/2020   Asthma    Bilateral leg edema 06/23/2020   Chest pain  04/15/2015   Chronic diastolic heart failure (HCC) 08/05/2020   Chronic heart failure with preserved ejection fraction (HCC) 07/11/2020   Coronary artery calcification seen on CT scan 04/15/2015   Coronary artery disease    Coronary artery disease involving native coronary artery of native heart without angina pectoris 07/11/2020   Coronary artery disease of native artery of native heart with stable angina pectoris (HCC) 06/23/2020   Essential hypertension 04/15/2015   Exertional dyspnea 05/26/2020   Frequent headaches 07/20/2020   GERD (gastroesophageal reflux disease) 05/27/2019   History of CVA in adulthood 08/05/2020   HLD (hyperlipidemia) 07/11/2020   Hyperglycemia 06/06/2015   Hyperlipidemia 04/15/2015   Hypertension 08/05/2020   Hypokalemia 07/20/2020   Left sided numbness 07/25/2020   Leukocytosis 06/06/2015   Malaise 06/17/2020   Mixed hyperlipidemia 04/15/2015   Morbid obesity (HCC) 04/15/2015   Need for prophylactic vaccination against Streptococcus pneumoniae (pneumococcus) 12/03/2019   Need for prophylactic vaccination and inoculation against influenza 12/03/2019   Need for tetanus booster 06/17/2020   Nonepileptic episode (HCC) 10/20/2020   Obesity, Class III, BMI 40-49.9 (morbid obesity) (HCC) 04/15/2015   Other sleep apnea 07/20/2020   Palpitations 08/05/2020   Pneumonia due to COVID-19 virus 04/06/2019   Prediabetes 06/23/2020   Prostate cancer screening 06/02/2019   Rectal bleeding 11/01/2020   Seizure-like activity (HCC) 05/26/2020   Shortness of breath 06/23/2020   TIA (transient ischemic attack)    Upper extremity weakness 05/26/2020   Weakness of left lower extremity 05/26/2020   Past Surgical History:  Procedure Laterality Date   CARDIAC CATHETERIZATION N/A 04/22/2015   Procedure: Left Heart Cath and Coronary Angiography;  Surgeon: Peter M Swaziland, MD;  Location: [facility] INVASIVE CV LAB;  Service: Cardiovascular;  Laterality: N/A;   GALLBLADDER SURGERY     LEFT HEART CATH AND CORONARY ANGIOGRAPHY N/A  10/28/2020   Procedure: LEFT HEART CATH AND CORONARY ANGIOGRAPHY;  Surgeon: Lennette Bihari, MD;  Location: [facility] INVASIVE CV LAB;  Service: Cardiovascular;  Laterality: N/A;   SINUS SURGERY WITH INSTATRAK      Family History  Problem Relation Age of Onset   Hypertension Mother    Rheum arthritis Mother    Heart attack Father    Prostate cancer Father        METS TO BRAIN; H/O SKIN CANCER   Hypertension Sister    Heart disease Sister    Heart attack Sister    Hypertension Sister    Prostate cancer Brother        METS TO BONE AND LIVER   Breast cancer Paternal Aunt    Prostate cancer Paternal Uncle        METS TO LYMPH NODES   Cancer Paternal Uncle        UNKNOWN PRIMARY ORAL MALIGNANCY   Breast cancer Cousin    Social History   Occupational History  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   Occupation: DISABLED  Tobacco Use   Smoking status: Former    Current packs/day: 0.00    Types: Cigarettes    Quit date: 04/13/1989    Years since quitting: 33.8   Smokeless tobacco: Never  Vaping Use   Vaping status: Never Used  Substance and Sexual Activity   Alcohol use: Never   Drug use: Never   Sexual activity: Not Currently    Tobacco Counseling Counseling given: Not Answered   Immunizations and Health Maintenance Immunization History  Administered Date(s) Administered   Fluad Trivalent(High Dose 65+) 12/07/2022   Influenza Inj Mdck Quad Pf 12/03/2019, 12/15/2020, 12/26/2021   Influenza-Unspecified 01/26/2015, 12/03/2019   PNEUMOCOCCAL CONJUGATE-20 12/26/2021   Pneumococcal Polysaccharide-23 12/03/2019   Tdap 06/17/2020   There are no preventive care reminders to display for this patient.  Activities of Daily Living    02/01/2023   12:24 PM  In your present state of health, do you have any difficulty performing the following activities:  Hearing? 0  Vision? 0  Difficulty concentrating or making decisions? 1  Walking or climbing stairs? 1  Dressing or bathing? 1  Doing  errands, shopping? 1  Preparing Food and eating ? Y  Using the Toilet? Y  In the past six months, have you accidently leaked urine? N  Do you have problems with loss of bowel control? N  Managing your Medications? N  Managing your Finances? N  Housekeeping or managing your Housekeeping? Y    Physical Exam   Physical Exam (optional), or other factors deemed appropriate based on the beneficiary's medical and social history and current clinical standards.   Advanced Directives: Does Patient Have a Medical Advance Directive?: No Would patient like information on creating a medical advance directive?: No - Patient declined   EKG:  normal EKG, normal sinus rhythm, unchanged from previous tracings     Assessment:    This is a routine wellness  examination for this patient .   PHYSICAL EXAM:   VS: BP 132/72 (BP Location: Left Arm, Patient Position: Sitting)   Pulse 88   Temp (!) 97.1 F (36.2 C) (Temporal)   Ht 5\' 4"  (1.626 m)   Wt (!) 320 lb (145.2 kg)   SpO2 93%   BMI 54.93 kg/m   GEN: Well nourished, well developed, in no acute distress   Cardiac: RRR; no murmurs, rubs, or gallops,no edema - Respiratory:  normal respiratory rate and pattern with no distress - normal breath sounds with no rales, rhonchi, wheezes or rubs  Psych: euthymic mood, appropriate affect and demeanor  Vision/Hearing screen Vision Screening   Right eye Left eye Both eyes  Without correction     With correction 20/30 20/30 20/30       Goals   None      Depression Screen    02/08/2023   11:18 AM 12/27/2022    9:32 AM 10/20/2022    4:15 PM 08/16/2022   10:32 AM  PHQ 2/9 Scores  PHQ - 2 Score 3 1 2 1   PHQ- 9 Score 9 4  11      Fall Risk    02/01/2023   12:24 PM  Fall Risk   Falls in the past year? 1  Number falls in past yr: 1  Injury with Fall? 0  Risk for fall due to : No Fall Risks  Follow up Falls evaluation completed    Cognitive Function        02/08/2023  11:14 AM   6CIT Screen  What Year? 0 points  What month? 0 points  What time? 0 points  Count back from 20 0 points  Months in reverse 0 points  Repeat phrase 4 points  Total Score 4 points    Patient Care Team: Marianne Sofia, Cordelia Poche as PCP - General (Physician Assistant) Thomasene Ripple, DO as PCP - Cardiology (Cardiology)     Plan:   Chronic fasting follow up as scheduled  I have personally reviewed and noted the following in the patient's chart:   Medical and social history Use of alcohol, tobacco or illicit drugs  Current medications and supplements Functional ability and status Nutritional status Physical activity Advanced directives List of other physicians Hospitalizations, surgeries, and ER visits in previous 12 months Vitals Screenings to include cognitive, depression, and falls Referrals and appointments  In addition, I have reviewed and discussed with patient certain preventive protocols, quality metrics, and best practice recommendations. A written personalized care plan for preventive services as well as general preventive health recommendations were provided to patient.

## 2023-02-08 NOTE — Progress Notes (Signed)
Is on medication

## 2023-02-08 NOTE — Progress Notes (Deleted)
Subjective:   Randall Rojas is a 62 y.o. male who presents for Medicare Annual/Subsequent preventive examination.  Visit Complete: {VISITMETHODVS:[phone]}  Patient Medicare AWV questionnaire was completed by the patient on ***; I have confirmed that all information answered by patient is correct and no changes since this date.        Objective:    Today's Vitals   02/08/23 1112  BP: 132/72  Pulse: 88  Temp: (!) 97.1 F (36.2 C)  TempSrc: Temporal  SpO2: 93%  Weight: (!) 320 lb (145.2 kg)  Height: 5\' 4"  (1.626 m)   Body mass index is 54.93 kg/m.     02/08/2023   11:13 AM 01/08/2023    4:34 PM 11/30/2022    2:55 PM 10/20/2022    4:03 PM 03/25/2021    9:22 PM 10/28/2020    6:03 AM 04/06/2019    3:00 AM  Advanced Directives  Does Patient Have a Medical Advance Directive? No No No No No No No  Would patient like information on creating a medical advance directive? No - Patient declined    No - Patient declined No - Patient declined No - Patient declined    Current Medications (verified) Outpatient Encounter Medications as of 02/08/2023  Medication Sig   acetaminophen (TYLENOL) 500 MG tablet Take by mouth.   amitriptyline (ELAVIL) 50 MG tablet TAKE ONE TABLET BY MOUTH AT BEDTIME   baclofen (LIORESAL) 10 MG tablet Take 10-20 tablets by mouth every 6 (six) hours as needed (pain).   benzonatate (TESSALON) 200 MG capsule Take 1 capsule (200 mg total) by mouth 3 (three) times daily as needed for cough.   blood glucose meter kit and supplies KIT Dispense based on patient and insurance preference. Use up to four times daily as directed.   bumetanide (BUMEX) 2 MG tablet Take 1 tablet by mouth twice daily.   busPIRone (BUSPAR) 30 MG tablet Take 30 mg by mouth 2 (two) times daily.   famotidine (PEPCID) 40 MG tablet Take 40 mg by mouth daily.   finasteride (PROSCAR) 5 MG tablet Take 5 mg by mouth daily.   GOODSENSE ASPIRIN 81 MG chewable tablet Chew 81 mg by mouth daily.    ipratropium-albuterol (DUONEB) 0.5-2.5 (3) MG/3ML SOLN Inhale into the lungs.   ketoconazole (NIZORAL) 2 % shampoo Apply topically 2 (two) times a week.   levETIRAcetam (KEPPRA) 750 MG tablet Take 1,500 mg by mouth 2 (two) times daily.   meclizine (ANTIVERT) 25 MG tablet Take 25 mg by mouth 3 (three) times daily as needed.   Misc Natural Products (URINOZINC PLUS PO) Take by mouth daily.   montelukast (SINGULAIR) 10 MG tablet Take 10 mg by mouth daily.   nitroGLYCERIN (NITROSTAT) 0.4 MG SL tablet Place 1 tablet (0.4 mg total) under the tongue every 5 (five) minutes as needed for chest pain. Up to three times.   omeprazole (PRILOSEC) 40 MG capsule Take 1 capsule (40 mg total) by mouth daily.   OXYGEN Inhale 2 L into the lungs daily as needed.   potassium chloride SA (KLOR-CON M) 20 MEQ tablet Take 1 tablet by mouth twice daily.   promethazine (PHENERGAN) 25 MG tablet Take 1 tablet by mouth every 6 hours as needed for nausea or vomiting.   ranolazine (RANEXA) 500 MG 12 hr tablet Take 1 tablet by mouth twice daily.   rosuvastatin (CRESTOR) 10 MG tablet TAKE 1 TABLET BY MOUTH ONCE DAILY.   sertraline (ZOLOFT) 100 MG tablet Take 200 mg by  mouth daily.   tadalafil (CIALIS) 20 MG tablet SMARTSIG:1 Tablet(s) By Mouth   VENTOLIN HFA 108 (90 Base) MCG/ACT inhaler INHALE TWO PUFFS INTO THE LUNGS EVERY 6 HOURS AS NEEDED FOR WHEEZING OR SHORTNESS OF BREATH   vitamin B-12 (CYANOCOBALAMIN) 1000 MCG tablet Take 1,000 mcg by mouth daily.   Vitamin D, Ergocalciferol, 50000 units CAPS Take 1 capsule by mouth once every 7 days as directed   fluticasone (FLONASE) 50 MCG/ACT nasal spray Place into the nose.   No facility-administered encounter medications on file as of 02/08/2023.    Allergies (verified) Cephalexin and Ubrogepant   History: Past Medical History:  Diagnosis Date   Acute laryngopharyngitis 11/11/2020   Acute URI 12/12/2019   Anxiety 06/17/2020   Arthralgia 06/17/2020   Asthma    Bilateral leg  edema 06/23/2020   Chest pain 04/15/2015   Chronic diastolic heart failure (HCC) 08/05/2020   Chronic heart failure with preserved ejection fraction (HCC) 07/11/2020   Coronary artery calcification seen on CT scan 04/15/2015   Coronary artery disease    Coronary artery disease involving native coronary artery of native heart without angina pectoris 07/11/2020   Coronary artery disease of native artery of native heart with stable angina pectoris (HCC) 06/23/2020   Essential hypertension 04/15/2015   Exertional dyspnea 05/26/2020   Frequent headaches 07/20/2020   GERD (gastroesophageal reflux disease) 05/27/2019   History of CVA in adulthood 08/05/2020   HLD (hyperlipidemia) 07/11/2020   Hyperglycemia 06/06/2015   Hyperlipidemia 04/15/2015   Hypertension 08/05/2020   Hypokalemia 07/20/2020   Left sided numbness 07/25/2020   Leukocytosis 06/06/2015   Malaise 06/17/2020   Mixed hyperlipidemia 04/15/2015   Morbid obesity (HCC) 04/15/2015   Need for prophylactic vaccination against Streptococcus pneumoniae (pneumococcus) 12/03/2019   Need for prophylactic vaccination and inoculation against influenza 12/03/2019   Need for tetanus booster 06/17/2020   Nonepileptic episode (HCC) 10/20/2020   Obesity, Class III, BMI 40-49.9 (morbid obesity) (HCC) 04/15/2015   Other sleep apnea 07/20/2020   Palpitations 08/05/2020   Pneumonia due to COVID-19 virus 04/06/2019   Prediabetes 06/23/2020   Prostate cancer screening 06/02/2019   Rectal bleeding 11/01/2020   Seizure-like activity (HCC) 05/26/2020   Shortness of breath 06/23/2020   TIA (transient ischemic attack)    Upper extremity weakness 05/26/2020   Weakness of left lower extremity 05/26/2020   Past Surgical History:  Procedure Laterality Date   CARDIAC CATHETERIZATION N/A 04/22/2015   Procedure: Left Heart Cath and Coronary Angiography;  Surgeon: Peter M Swaziland, MD;  Location: [facility] INVASIVE CV LAB;  Service: Cardiovascular;  Laterality: N/A;   GALLBLADDER SURGERY     LEFT HEART CATH  AND CORONARY ANGIOGRAPHY N/A 10/28/2020   Procedure: LEFT HEART CATH AND CORONARY ANGIOGRAPHY;  Surgeon: Lennette Bihari, MD;  Location: [facility] INVASIVE CV LAB;  Service: Cardiovascular;  Laterality: N/A;   SINUS SURGERY WITH INSTATRAK     Family History  Problem Relation Age of Onset   Hypertension Mother    Rheum arthritis Mother    Heart attack Father    Prostate cancer Father        METS TO BRAIN; H/O SKIN CANCER   Hypertension Sister    Heart disease Sister    Heart attack Sister    Hypertension Sister    Prostate cancer Brother        METS TO BONE AND LIVER   Breast cancer Paternal Aunt    Prostate cancer Paternal Uncle  METS TO LYMPH NODES   Cancer Paternal Uncle        UNKNOWN PRIMARY ORAL MALIGNANCY   Breast cancer Cousin    Social History   Socioeconomic History   Marital status: Married    Spouse name: [name]   Number of children: 3   Years of education: 12   Highest education level: Not on file  Occupational History   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   Occupation: DISABLED  Tobacco Use   Smoking status: Former    Current packs/day: 0.00    Types: Cigarettes    Quit date: 04/13/1989    Years since quitting: 33.8   Smokeless tobacco: Never  Vaping Use   Vaping status: Never Used  Substance and Sexual Activity   Alcohol use: Never   Drug use: Never   Sexual activity: Not Currently  Other Topics Concern   Not on file  Social History Narrative   Epworth Sleepiness Scale = 7 (as of 1/18/207)   Social Determinants of Health   Financial Resource Strain: High Risk (06/17/2021)   Overall Financial Resource Strain (CARDIA)    Difficulty of Paying Living Expenses: Hard  Food Insecurity: Food Insecurity Present (10/20/2022)   Hunger Vital Sign    Worried About Running Out of Food in the Last Year: Often true    Ran Out of Food in the Last Year: Sometimes true  Transportation Needs: No Transportation Needs (10/20/2022)   PRAPARE - Scientist, research (physical sciences) (Medical): No    Lack of Transportation (Non-Medical): No  Physical Activity: Inactive (02/08/2023)   Exercise Vital Sign    Days of Exercise per Week: 0 days    Minutes of Exercise per Session: 0 min  Stress: Not on file  Social Connections: Moderately Integrated (02/08/2023)   Social Connection and Isolation Panel [NHANES]    Frequency of Communication with Friends and Family: Three times a week    Frequency of Social Gatherings with Friends and Family: Three times a week    Attends Religious Services: More than 4 times per year    Active Member of Clubs or Organizations: No    Attends Banker Meetings: Never    Marital Status: Married    Tobacco Counseling Counseling given: Not Answered   Clinical Intake:           BMI - recorded: 54.9 Nutritional Status: BMI > 30  Obese  How often do you need to have someone help you when you read instructions, pamphlets, or other written materials from your doctor or pharmacy?: 1 - Never         Activities of Daily Living    02/01/2023   12:24 PM  In your present state of health, do you have any difficulty performing the following activities:  Hearing? 0  Vision? 0  Difficulty concentrating or making decisions? 1  Walking or climbing stairs? 1  Dressing or bathing? 1  Doing errands, shopping? 1  Preparing Food and eating ? Y  Using the Toilet? Y  In the past six months, have you accidently leaked urine? N  Do you have problems with loss of bowel control? N  Managing your Medications? N  Managing your Finances? N  Housekeeping or managing your Housekeeping? Y    Patient Care Team: Marianne Sofia, PA-C as PCP - General (Physician Assistant) Thomasene Ripple, DO as PCP - Cardiology (Cardiology)  Indicate any recent Medical Services you may have received from other than [facility] providers in  the past year (date may be approximate).     Assessment:   This is a routine wellness examination for  Kasim.  Hearing/Vision screen No results found.   Goals Addressed   None   Depression Screen    02/08/2023   11:18 AM 12/27/2022    9:32 AM 10/20/2022    4:15 PM 08/16/2022   10:32 AM 06/14/2022   11:00 AM 12/26/2021    8:48 AM 05/26/2020    9:45 AM  PHQ 2/9 Scores  PHQ - 2 Score 3 1 2 1  0 0 0  PHQ- 9 Score 9 4  11   0     Fall Risk    02/01/2023   12:24 PM 12/27/2022    9:32 AM 08/16/2022   10:32 AM 06/14/2022   11:00 AM  Fall Risk   Falls in the past year? 1 1 1  0  Number falls in past yr: 1 1 1  0  Injury with Fall? 0 0 0 0  Risk for fall due to : No Fall Risks History of fall(s) History of fall(s) No Fall Risks  Follow up Falls evaluation completed Falls evaluation completed Falls evaluation completed Falls evaluation completed    MEDICARE RISK AT HOME: Medicare Risk at Home Any stairs in or around the home?: Yes If so, are there any without handrails?: Yes Home free of loose throw rugs in walkways, pet beds, electrical cords, etc?: No Adequate lighting in your home to reduce risk of falls?: Yes Life alert?: No Use of a cane, walker or w/c?: Yes Grab bars in the bathroom?: No Shower chair or bench in shower?: Yes Elevated toilet seat or a handicapped toilet?: No  TIMED UP AND GO:  Was the test performed?  {AMBTIMEDUPGO:[id number]}    Cognitive Function:        02/08/2023   11:14 AM  6CIT Screen  What Year? 0 points  What month? 0 points  What time? 0 points  Count back from 20 0 points  Months in reverse 0 points  Repeat phrase 4 points  Total Score 4 points    Immunizations Immunization History  Administered Date(s) Administered   Fluad Trivalent(High Dose 65+) 12/07/2022   Influenza Inj Mdck Quad Pf 12/03/2019, 12/15/2020, 12/26/2021   Influenza-Unspecified 01/26/2015, 12/03/2019   PNEUMOCOCCAL CONJUGATE-20 12/26/2021   Pneumococcal Polysaccharide-23 12/03/2019   Tdap 06/17/2020     Screening Tests Health Maintenance  Topic Date Due    Zoster Vaccines- Shingrix (1 of 2) 03/29/2023 (Originally 12/04/1979)   Medicare Annual Wellness (AWV)  02/08/2024   Colonoscopy  05/03/2025   DTaP/Tdap/Td (2 - Td or Tdap) 06/18/2030   INFLUENZA VACCINE  Completed   HIV Screening  Completed   HPV VACCINES  Aged Out   COVID-19 Vaccine  Discontinued   Hepatitis C Screening  Discontinued    Health Maintenance  There are no preventive care reminders to display for this patient.   {Colorectal cancer screening:2101809}   Additional Screening:   Dental Screening: Recommended annual dental exams for proper oral hygiene  Diabetic Foot Exam: {Diabetic Foot Exam:2101802}  Community Resource Referral / Chronic Care Management: CRR required this visit?  {YES/NO:21197}  CCM required this visit?  {CCM Required [phone]}     Plan:     I have personally reviewed and noted the following in the patient's chart:   Medical and social history Use of alcohol, tobacco or illicit drugs  Current medications and supplements including opioid prescriptions. {Opioid Prescriptions:[phone]} Functional ability and status Nutritional status  Physical activity Advanced directives List of other physicians Hospitalizations, surgeries, and ER visits in previous 12 months Vitals Screenings to include cognitive, depression, and falls Referrals and appointments  In addition, I have reviewed and discussed with patient certain preventive protocols, quality metrics, and best practice recommendations. A written personalized care plan for preventive services as well as general preventive health recommendations were provided to patient.

## 2023-02-08 NOTE — Progress Notes (Signed)
Has MCD

## 2023-02-09 NOTE — Addendum Note (Signed)
Addended by: Precious Reel on: 02/09/2023 11:56 AM   Modules accepted: Orders

## 2023-02-12 DIAGNOSIS — Z87891 Personal history of nicotine dependence: Secondary | ICD-10-CM | POA: Diagnosis not present

## 2023-02-12 DIAGNOSIS — R079 Chest pain, unspecified: Secondary | ICD-10-CM | POA: Diagnosis not present

## 2023-02-12 DIAGNOSIS — R109 Unspecified abdominal pain: Secondary | ICD-10-CM | POA: Diagnosis not present

## 2023-02-12 DIAGNOSIS — K219 Gastro-esophageal reflux disease without esophagitis: Secondary | ICD-10-CM | POA: Diagnosis not present

## 2023-02-12 DIAGNOSIS — M545 Low back pain, unspecified: Secondary | ICD-10-CM | POA: Diagnosis not present

## 2023-02-12 DIAGNOSIS — Z7982 Long term (current) use of aspirin: Secondary | ICD-10-CM | POA: Diagnosis not present

## 2023-02-13 DIAGNOSIS — R109 Unspecified abdominal pain: Secondary | ICD-10-CM | POA: Diagnosis not present

## 2023-02-13 DIAGNOSIS — M545 Low back pain, unspecified: Secondary | ICD-10-CM | POA: Diagnosis not present

## 2023-02-13 DIAGNOSIS — R079 Chest pain, unspecified: Secondary | ICD-10-CM | POA: Diagnosis not present

## 2023-02-15 DIAGNOSIS — J4540 Moderate persistent asthma, uncomplicated: Secondary | ICD-10-CM | POA: Diagnosis not present

## 2023-02-15 DIAGNOSIS — R400 Somnolence: Secondary | ICD-10-CM | POA: Diagnosis not present

## 2023-02-15 DIAGNOSIS — G4733 Obstructive sleep apnea (adult) (pediatric): Secondary | ICD-10-CM | POA: Diagnosis not present

## 2023-02-15 DIAGNOSIS — R5383 Other fatigue: Secondary | ICD-10-CM | POA: Diagnosis not present

## 2023-02-19 DIAGNOSIS — R5383 Other fatigue: Secondary | ICD-10-CM | POA: Diagnosis not present

## 2023-02-19 DIAGNOSIS — G4733 Obstructive sleep apnea (adult) (pediatric): Secondary | ICD-10-CM | POA: Diagnosis not present

## 2023-02-24 ENCOUNTER — Other Ambulatory Visit: Payer: Self-pay | Admitting: Physician Assistant

## 2023-02-24 DIAGNOSIS — J029 Acute pharyngitis, unspecified: Secondary | ICD-10-CM | POA: Diagnosis not present

## 2023-02-24 DIAGNOSIS — J019 Acute sinusitis, unspecified: Secondary | ICD-10-CM | POA: Diagnosis not present

## 2023-02-24 DIAGNOSIS — K219 Gastro-esophageal reflux disease without esophagitis: Secondary | ICD-10-CM

## 2023-03-01 DIAGNOSIS — J4540 Moderate persistent asthma, uncomplicated: Secondary | ICD-10-CM | POA: Diagnosis not present

## 2023-03-01 DIAGNOSIS — G4733 Obstructive sleep apnea (adult) (pediatric): Secondary | ICD-10-CM | POA: Diagnosis not present

## 2023-03-01 DIAGNOSIS — R5383 Other fatigue: Secondary | ICD-10-CM | POA: Diagnosis not present

## 2023-03-01 DIAGNOSIS — R400 Somnolence: Secondary | ICD-10-CM | POA: Diagnosis not present

## 2023-03-02 ENCOUNTER — Telehealth: Payer: Self-pay | Admitting: Cardiology

## 2023-03-02 NOTE — Telephone Encounter (Signed)
Spoke w/pt's wife in regards to [facility] paperwork received [employer]. Per Dr. Servando Salina, pt's PCP or neurologist will need to complete paperwork. Pt's wife verbalized understanding.  JB, 03-02-23

## 2023-03-23 ENCOUNTER — Other Ambulatory Visit: Payer: Self-pay | Admitting: Physician Assistant

## 2023-04-09 ENCOUNTER — Telehealth: Payer: Self-pay | Admitting: Pharmacy Technician

## 2023-04-09 ENCOUNTER — Other Ambulatory Visit (HOSPITAL_COMMUNITY): Payer: Self-pay

## 2023-04-09 ENCOUNTER — Telehealth: Payer: Self-pay | Admitting: Pharmacist

## 2023-04-09 ENCOUNTER — Encounter: Payer: Self-pay | Admitting: Pharmacist

## 2023-04-09 ENCOUNTER — Ambulatory Visit: Payer: Medicare Other | Attending: Cardiology | Admitting: Pharmacist

## 2023-04-09 DIAGNOSIS — G459 Transient cerebral ischemic attack, unspecified: Secondary | ICD-10-CM

## 2023-04-09 MED ORDER — WEGOVY 0.25 MG/0.5ML ~~LOC~~ SOAJ: 0.2500 mg | SUBCUTANEOUS | 0 refills | Status: DC

## 2023-04-09 NOTE — Patient Instructions (Addendum)
 It was nice meeting you two today  The medications we spoke about today are called Wegovy  and Zepbound   I will complete the prior authorizations for you and contact you when it is approved  Both medications are injected once weekly  We recommend increasing vegetables, fruits, lean proteins, whole grains, and healthy fats  Try to increase physical activity as tolerated  Please let us  know if you have any questions  Medford Bolk, PharmD, BCACP, CDCES, CPP [address] [place], [place], [address] Phone: [phone], Fax: [phone]

## 2023-04-09 NOTE — Addendum Note (Signed)
 Addended by: Cheree Ditto on: 04/09/2023 04:31 PM   Modules accepted: Orders

## 2023-04-09 NOTE — Telephone Encounter (Signed)
 Pharmacy Patient Advocate Encounter   Received notification from Pt Calls Messages that prior authorization for wegovy  is required/requested.   Insurance verification completed.   The patient is insured through [facility] .   Per test claim: PA required; PA submitted to above mentioned insurance via CoverMyMeds Key/confirmation #/EOC [id number] Status is pending
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
## 2023-04-09 NOTE — Telephone Encounter (Signed)
 Pharmacy Patient Advocate Encounter  Received notification from [employer] that Prior Authorization for wegovy  has been APPROVED from 04/09/23 to 03/26/24. Ran test claim, Copay is $12.15 one month. This test claim was processed through [facility]- copay amounts may vary at other pharmacies due to pharmacy/plan contracts, or as the patient moves through the different stages of their insurance plan.   PA #/Case ID/Reference #: [id number]

## 2023-04-09 NOTE — Telephone Encounter (Signed)
Please complete PA for Wegovy.

## 2023-04-19 DIAGNOSIS — G43709 Chronic migraine without aura, not intractable, without status migrainosus: Secondary | ICD-10-CM | POA: Diagnosis not present

## 2023-04-24 ENCOUNTER — Other Ambulatory Visit: Payer: Self-pay | Admitting: Cardiology

## 2023-04-24 ENCOUNTER — Other Ambulatory Visit: Payer: Self-pay | Admitting: Physician Assistant

## 2023-04-24 DIAGNOSIS — R110 Nausea: Secondary | ICD-10-CM

## 2023-05-02 ENCOUNTER — Ambulatory Visit: Payer: Medicare Other | Admitting: Physician Assistant

## 2023-05-07 ENCOUNTER — Other Ambulatory Visit: Payer: Self-pay | Admitting: Pharmacist Clinician (PhC)/ Clinical Pharmacy Specialist

## 2023-05-07 DIAGNOSIS — G459 Transient cerebral ischemic attack, unspecified: Secondary | ICD-10-CM

## 2023-05-07 MED ORDER — WEGOVY 0.25 MG/0.5ML ~~LOC~~ SOAJ: 0.5000 mg | SUBCUTANEOUS | 0 refills | Status: DC

## 2023-05-08 DIAGNOSIS — R531 Weakness: Secondary | ICD-10-CM | POA: Diagnosis not present

## 2023-05-08 DIAGNOSIS — R569 Unspecified convulsions: Secondary | ICD-10-CM | POA: Diagnosis not present

## 2023-05-08 DIAGNOSIS — G43709 Chronic migraine without aura, not intractable, without status migrainosus: Secondary | ICD-10-CM | POA: Diagnosis not present

## 2023-05-11 NOTE — Telephone Encounter (Signed)
Patient is following up. He states [facility] informed him that the prescription they received looked like the first round and they are unable to distribute the medication. May be regarding the dose. Please advise.

## 2023-05-14 ENCOUNTER — Telehealth: Payer: Self-pay | Admitting: Cardiology

## 2023-05-14 DIAGNOSIS — G459 Transient cerebral ischemic attack, unspecified: Secondary | ICD-10-CM

## 2023-05-14 MED ORDER — WEGOVY 0.5 MG/0.5ML ~~LOC~~ SOAJ: 0.5000 mg | SUBCUTANEOUS | 0 refills | Status: DC

## 2023-05-14 NOTE — Telephone Encounter (Signed)
Called and spoke with wife. Needs Rx for [facility] 0.5mg  sent to pharmacy. Prescription sent.

## 2023-05-14 NOTE — Telephone Encounter (Signed)
Not sure what his message means. Called patient. No answer, lmom

## 2023-05-14 NOTE — Telephone Encounter (Signed)
Message sent to Pharm D for advice.

## 2023-05-14 NOTE — Telephone Encounter (Signed)
Pt c/o medication issue:  1. Name of Medication:   Semaglutide-Weight Management (WEGOVY) 0.25 MG/0.5ML SOAJ    2. How are you currently taking this medication (dosage and times per day)? As prescribed   3. Are you having a reaction (difficulty breathing--STAT)? No   4. What is your medication issue? Patient's wife is calling in stating she went to pick up his second dose that is due tomorrow on, but the dosage sent in was the same as the first instead of being increased. Please advise.

## 2023-05-21 ENCOUNTER — Other Ambulatory Visit: Payer: Self-pay | Admitting: Physician Assistant

## 2023-05-22 ENCOUNTER — Ambulatory Visit (INDEPENDENT_AMBULATORY_CARE_PROVIDER_SITE_OTHER): Payer: Medicare Other | Admitting: Physician Assistant

## 2023-05-22 ENCOUNTER — Encounter: Payer: Self-pay | Admitting: Physician Assistant

## 2023-05-22 VITALS — BP 110/72 | HR 76 | Temp 97.5°F | Resp 18 | Ht 64.0 in | Wt 307.8 lb

## 2023-05-22 DIAGNOSIS — R569 Unspecified convulsions: Secondary | ICD-10-CM | POA: Diagnosis not present

## 2023-05-22 DIAGNOSIS — E782 Mixed hyperlipidemia: Secondary | ICD-10-CM | POA: Diagnosis not present

## 2023-05-22 DIAGNOSIS — E559 Vitamin D deficiency, unspecified: Secondary | ICD-10-CM

## 2023-05-22 DIAGNOSIS — K219 Gastro-esophageal reflux disease without esophagitis: Secondary | ICD-10-CM | POA: Diagnosis not present

## 2023-05-22 DIAGNOSIS — C61 Malignant neoplasm of prostate: Secondary | ICD-10-CM

## 2023-05-22 DIAGNOSIS — I5032 Chronic diastolic (congestive) heart failure: Secondary | ICD-10-CM | POA: Diagnosis not present

## 2023-05-22 DIAGNOSIS — E538 Deficiency of other specified B group vitamins: Secondary | ICD-10-CM | POA: Diagnosis not present

## 2023-05-22 DIAGNOSIS — I10 Essential (primary) hypertension: Secondary | ICD-10-CM

## 2023-05-22 DIAGNOSIS — R7989 Other specified abnormal findings of blood chemistry: Secondary | ICD-10-CM

## 2023-05-22 DIAGNOSIS — R7303 Prediabetes: Secondary | ICD-10-CM

## 2023-05-22 DIAGNOSIS — I25118 Atherosclerotic heart disease of native coronary artery with other forms of angina pectoris: Secondary | ICD-10-CM | POA: Diagnosis not present

## 2023-05-22 DIAGNOSIS — I110 Hypertensive heart disease with heart failure: Secondary | ICD-10-CM | POA: Diagnosis not present

## 2023-05-22 MED ORDER — KETOCONAZOLE 2 % EX SHAM: MEDICATED_SHAMPOO | CUTANEOUS | 1 refills | Status: DC

## 2023-05-22 NOTE — Progress Notes (Signed)
 Established Patient Office Visit  Subjective:  Patient ID: Randall Rojas, male    DOB: 1961-01-24  Age: 63 y.o. MRN: [id number]  CC:  Chief Complaint  Patient presents with   Medical Management of Chronic Issues    HPI [name] presents for follow up of chronic medical issues   Mixed hyperlipidemia  Pt presents with hyperlipidemia. Compliance with treatment has been fair; The patient maintains a low cholesterol diet , follows up as directed ,  The patient denies experiencing any hypercholesterolemia related symptoms. -- he also has coronary artery disease - follows with Dr Servando Salina (cardiology) regularly Currently on crestor 10mg  every day as well as asa 81mg  every day and ranexa  Pt has history of edema - he takes bumex 2mg  prn swelling along with potassium - states symptoms are stable at this time and having no problems  Pt continues to follow with neurology for history of headaches , history of TIA - he does follow up with them regularly -- provider amy Mariam Dollar NP He is taking keppra - will get level today with labwork Recently had video visit with provider  Pt  has anxiety/depression and takes zoloft 100 , trazadone 50mg , elavil 50mg  and buspar 30.  He does see a counselor - states symptoms are stable at this tiem  Pt with GERD - pt using omeprazole 40mg  as directed  Pt withVit D def - taking weekly supplement - due for labwork  Pt has vit B12 def - taking otc supplement - due for labwork - he  did decrease to only taking every other day  Pt was recently diagnosed with prostate cancer - he is following with Dr Saddie Benders - he is currently on proscar 5mg  qd and cialis 20mg   Pt with coronary artery disease and chronic heart failure - he follows with Dr Servando Salina regularly -  Takes Ranexa 500mg  and crestor 10mg  --- next appt in April or May for follow up Denies chest pain/dyspnea  Pt currently on Wegovy for weight management with CAD - tolerating medication well  Pt with  elevated ferritin - has consulted with hematology and stable at this time Past Medical History:  Diagnosis Date   Acute laryngopharyngitis 11/11/2020   Acute URI 12/12/2019   Anxiety 06/17/2020   Arthralgia 06/17/2020   Asthma    Bilateral leg edema 06/23/2020   Chest pain 04/15/2015   Chronic diastolic heart failure (HCC) 08/05/2020   Chronic heart failure with preserved ejection fraction (HCC) 07/11/2020   Coronary artery calcification seen on CT scan 04/15/2015   Coronary artery disease    Coronary artery disease involving native coronary artery of native heart without angina pectoris 07/11/2020   Coronary artery disease of native artery of native heart with stable angina pectoris (HCC) 06/23/2020   Essential hypertension 04/15/2015   Exertional dyspnea 05/26/2020   Frequent headaches 07/20/2020   GERD (gastroesophageal reflux disease) 05/27/2019   History of CVA in adulthood 08/05/2020   HLD (hyperlipidemia) 07/11/2020   Hyperglycemia 06/06/2015   Hyperlipidemia 04/15/2015   Hypertension 08/05/2020   Hypokalemia 07/20/2020   Left sided numbness 07/25/2020   Leukocytosis 06/06/2015   Malaise 06/17/2020   Mixed hyperlipidemia 04/15/2015   Morbid obesity (HCC) 04/15/2015   Need for prophylactic vaccination against Streptococcus pneumoniae (pneumococcus) 12/03/2019   Need for prophylactic vaccination and inoculation against influenza 12/03/2019   Need for tetanus booster 06/17/2020   Nonepileptic episode (HCC) 10/20/2020   Obesity, Class III, BMI 40-49.9 (morbid obesity) (HCC) 04/15/2015  Other sleep apnea 07/20/2020   Palpitations 08/05/2020   Pneumonia due to COVID-19 virus 04/06/2019   Prediabetes 06/23/2020   Prostate cancer screening 06/02/2019   Rectal bleeding 11/01/2020   Seizure-like activity (HCC) 05/26/2020   Shortness of breath 06/23/2020   TIA (transient ischemic attack)    Upper extremity weakness 05/26/2020   Weakness of left lower extremity 05/26/2020    Past Surgical History:  Procedure Laterality  Date   CARDIAC CATHETERIZATION N/A 04/22/2015   Procedure: Left Heart Cath and Coronary Angiography;  Surgeon: Peter M Swaziland, MD;  Location: [facility] INVASIVE CV LAB;  Service: Cardiovascular;  Laterality: N/A;   GALLBLADDER SURGERY     LEFT HEART CATH AND CORONARY ANGIOGRAPHY N/A 10/28/2020   Procedure: LEFT HEART CATH AND CORONARY ANGIOGRAPHY;  Surgeon: Lennette Bihari, MD;  Location: [facility] INVASIVE CV LAB;  Service: Cardiovascular;  Laterality: N/A;   SINUS SURGERY WITH INSTATRAK      Family History  Problem Relation Age of Onset   Hypertension Mother    Rheum arthritis Mother    Heart attack Father    Prostate cancer Father        METS TO BRAIN; H/O SKIN CANCER   Hypertension Sister    Heart disease Sister    Heart attack Sister    Hypertension Sister    Prostate cancer Brother        METS TO BONE AND LIVER   Breast cancer Paternal Aunt    Prostate cancer Paternal Uncle        METS TO LYMPH NODES   Cancer Paternal Uncle        UNKNOWN PRIMARY ORAL MALIGNANCY   Breast cancer Cousin     Social History   Socioeconomic History   Marital status: Married    Spouse name: [name]   Number of children: 3   Years of education: 12   Highest education level: Not on file  Occupational History   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   Occupation: DISABLED  Tobacco Use   Smoking status: Former    Current packs/day: 0.00    Types: Cigarettes    Quit date: 04/13/1989    Years since quitting: 34.1   Smokeless tobacco: Never  Vaping Use   Vaping status: Never Used  Substance and Sexual Activity   Alcohol use: Never   Drug use: Never   Sexual activity: Not Currently  Other Topics Concern   Not on file  Social History Narrative   Epworth Sleepiness Scale = 7 (as of 1/18/207)   Social Drivers of Health   Financial Resource Strain: Medium Risk (02/08/2023)   Overall Financial Resource Strain (CARDIA)    Difficulty of Paying Living Expenses: Somewhat hard  Food Insecurity: No Food Insecurity  (02/08/2023)   Hunger Vital Sign    Worried About Running Out of Food in the Last Year: Never true    Ran Out of Food in the Last Year: Never true  Transportation Needs: No Transportation Needs (02/08/2023)   PRAPARE - Administrator, Civil Service (Medical): No    Lack of Transportation (Non-Medical): No  Physical Activity: Inactive (02/08/2023)   Exercise Vital Sign    Days of Exercise per Week: 0 days    Minutes of Exercise per Session: 0 min  Stress: Stress Concern Present (02/08/2023)   [employer] of Occupational Health - Occupational Stress Questionnaire    Feeling of Stress : To some extent  Social Connections: Moderately Integrated (02/08/2023)   Social Connection  and Isolation Panel [NHANES]    Frequency of Communication with Friends and Family: Three times a week    Frequency of Social Gatherings with Friends and Family: Three times a week    Attends Religious Services: More than 4 times per year    Active Member of Clubs or Organizations: No    Attends Banker Meetings: Never    Marital Status: Married  Catering manager Violence: Not At Risk (02/08/2023)   Humiliation, Afraid, Rape, and Kick questionnaire    Fear of Current or Ex-Partner: No    Emotionally Abused: No    Physically Abused: No    Sexually Abused: No     Current Outpatient Medications:    acetaminophen (TYLENOL) 500 MG tablet, Take by mouth., Disp: , Rfl:    amitriptyline (ELAVIL) 50 MG tablet, TAKE ONE TABLET BY MOUTH AT BEDTIME, Disp: 90 tablet, Rfl: 1   baclofen (LIORESAL) 10 MG tablet, Take 10-20 tablets by mouth every 6 (six) hours as needed (pain)., Disp: , Rfl:    benzonatate (TESSALON) 200 MG capsule, Take 1 capsule (200 mg total) by mouth 3 (three) times daily as needed for cough., Disp: 30 capsule, Rfl: 1   blood glucose meter kit and supplies KIT, Dispense based on patient and insurance preference. Use up to four times daily as directed., Disp: 1 each, Rfl: 0    bumetanide (BUMEX) 2 MG tablet, Take 1 tablet by mouth twice daily., Disp: 180 tablet, Rfl: 3   busPIRone (BUSPAR) 30 MG tablet, Take 30 mg by mouth 2 (two) times daily., Disp: , Rfl:    finasteride (PROSCAR) 5 MG tablet, Take 5 mg by mouth daily., Disp: , Rfl:    GOODSENSE ASPIRIN 81 MG chewable tablet, Chew 81 mg by mouth daily., Disp: , Rfl:    ipratropium-albuterol (DUONEB) 0.5-2.5 (3) MG/3ML SOLN, Inhale into the lungs., Disp: , Rfl:    levETIRAcetam (KEPPRA) 750 MG tablet, Take 1,500 mg by mouth 2 (two) times daily., Disp: , Rfl:    meclizine (ANTIVERT) 25 MG tablet, Take 25 mg by mouth 3 (three) times daily as needed., Disp: , Rfl:    Misc Natural Products (URINOZINC PLUS PO), Take by mouth daily., Disp: , Rfl:    nitroGLYCERIN (NITROSTAT) 0.4 MG SL tablet, Place 1 tablet (0.4 mg total) under the tongue every 5 (five) minutes as needed for chest pain. Up to three times., Disp: 45 tablet, Rfl: 3   omeprazole (PRILOSEC) 40 MG capsule, Take 1 capsule by mouth once daily., Disp: 30 capsule, Rfl: 3   OXYGEN, Inhale 2 L into the lungs daily as needed., Disp: , Rfl:    potassium chloride SA (KLOR-CON M) 20 MEQ tablet, Take 1 tablet by mouth twice daily., Disp: 180 tablet, Rfl: 3   promethazine (PHENERGAN) 25 MG tablet, Take 1 tablet by mouth every 6 hours as needed for nausea or vomiting., Disp: 30 tablet, Rfl: 0   ranolazine (RANEXA) 500 MG 12 hr tablet, Take 1 tablet by mouth twice daily., Disp: 180 tablet, Rfl: 3   rosuvastatin (CRESTOR) 10 MG tablet, TAKE 1 TABLET BY MOUTH ONCE DAILY., Disp: 90 tablet, Rfl: 3   Semaglutide-Weight Management (WEGOVY) 0.5 MG/0.5ML SOAJ, Inject 0.5 mg into the skin once a week., Disp: 2 mL, Rfl: 0   sertraline (ZOLOFT) 100 MG tablet, Take 200 mg by mouth daily., Disp: , Rfl:    tadalafil (CIALIS) 20 MG tablet, SMARTSIG:1 Tablet(s) By Mouth, Disp: , Rfl:    traZODone (DESYREL) 50  MG tablet, Take 100 mg by mouth at bedtime as needed., Disp: , Rfl:    VENTOLIN HFA  108 (90 Base) MCG/ACT inhaler, INHALE TWO PUFFS INTO THE LUNGS EVERY 6 HOURS AS NEEDED FOR WHEEZING OR SHORTNESS OF BREATH, Disp: 18 g, Rfl: 1   vitamin B-12 (CYANOCOBALAMIN) 1000 MCG tablet, Take 1,000 mcg by mouth daily., Disp: , Rfl:    Vitamin D, Ergocalciferol, (DRISDOL) 1.25 MG (50000 UNIT) CAPS capsule, Take 1 capsule by mouth once every 7 days as directed, Disp: 12 capsule, Rfl: 1   [START ON 05/24/2023] ketoconazole (NIZORAL) 2 % shampoo, Apply topically 2 (two) times a week., Disp: 120 mL, Rfl: 1   Allergies  Allergen Reactions   Ubrogepant Other (See Comments) and Swelling   Cephalexin Nausea And Vomiting, Rash and Other (See Comments)   Isosorbide Nitrate Other (See Comments)    Blood pressure and heart rate decrease   CONSTITUTIONAL: Negative for chills, fatigue, fever, unintentional weight gain and unintentional weight loss.  E/N/T: Negative for ear pain, nasal congestion and sore throat.  CARDIOVASCULAR: Negative for chest pain, dizziness, palpitations and pedal edema.  RESPIRATORY: Negative for recent cough and dyspnea.  GASTROINTESTINAL: Negative for abdominal pain, acid reflux symptoms, constipation, diarrhea, nausea and vomiting.  MSK: Negative for arthralgias and myalgias.  INTEGUMENTARY: Negative for rash.  NEUROLOGICAL: Negative for dizziness and headaches.  PSYCHIATRIC: Negative for sleep disturbance and to question depression screen.  Negative for depression, negative for anhedonia.      Objective:  PHYSICAL EXAM:   VS: BP 110/72 (BP Location: Left Arm, Patient Position: Sitting, Cuff Size: Large)   Pulse 76   Temp (!) 97.5 F (36.4 C) (Temporal)   Resp 18   Ht 5\' 4"  (1.626 m)   Wt (!) 307 lb 12.8 oz (139.6 kg)   SpO2 93%   BMI 52.83 kg/m   GEN: Well nourished, well developed, in no acute distress   Cardiac: RRR; no murmurs, rubs, or gallops,no edema -  Respiratory:  normal respiratory rate and pattern with no distress - normal breath sounds with no  rales, rhonchi, wheezes or rubs GI: normal bowel sounds, no masses or tenderness MS: no deformity or atrophy  Skin: warm and dry, no rash  Neuro:  Alert and Oriented x 3,  - CN II-Xii grossly intact Psych: euthymic mood, appropriate affect and demeanor   There are no preventive care reminders to display for this patient.  Lab Results  Component Value Date   TSH 2.990 12/27/2022   Lab Results  Component Value Date   WBC 7.6 12/27/2022   HGB 15.1 12/27/2022   HCT 46.4 12/27/2022   MCV 95 12/27/2022   PLT 146 (L) 12/27/2022   Lab Results  Component Value Date   NA 142 12/27/2022   K 4.2 12/27/2022   CO2 27 12/27/2022   GLUCOSE 98 12/27/2022   BUN 9 12/27/2022   CREATININE 1.06 12/27/2022   BILITOT 0.6 12/27/2022   ALKPHOS 127 (H) 12/27/2022   AST 46 (H) 12/27/2022   ALT 48 (H) 12/27/2022   PROT 7.0 12/27/2022   ALBUMIN 4.0 12/27/2022   CALCIUM 8.7 12/27/2022   ANIONGAP 11 04/07/2019   EGFR 79 12/27/2022   Lab Results  Component Value Date   CHOL 136 12/27/2022   Lab Results  Component Value Date   HDL 41 12/27/2022   Lab Results  Component Value Date   LDLCALC 74 12/27/2022   Lab Results  Component Value Date   TRIG  116 12/27/2022   Lab Results  Component Value Date   CHOLHDL 3.3 12/27/2022   Lab Results  Component Value Date   HGBA1C 5.9 (H) 12/27/2022      Assessment & Plan:   Problem List Items Addressed This Visit       Elevated ferritin   Labwork pending   Follow up with hematology as directed      Prediabetes Watch diet   Hgb A1c pending              Mixed hyperlipidemia   Relevant Medications   isosorbide mononitrate (IMDUR) 15 mg TB24 24 hr tablet Continue crestor 10mg  qd   Other Relevant Orders   Lipid panel Continue meds Watch diet    Seizure like activity (HCC) Continue meds and follow up with neurology as directed Continue ASA Keppra level pending  GERD Rx for omeprazole as directed   Coronary artery disease  with stable angina pectoris (HCC) Continue meds as directed Labwork pending Follow up with cardiology as directed  Chronic diastolic heart failure (HCC) Continue meds  Continue follow up with cardiology  Anxiety Continue current meds as directed  Vit D def Continue supplement Labwork pending    Prostate cancer (HCC) Follow up with urology as directed                        Meds ordered this encounter  Medications   ketoconazole (NIZORAL) 2 % shampoo    Sig: Apply topically 2 (two) times a week.    Dispense:  120 mL    Refill:  1    Supervising Provider:   Blane Ohara [id number]     Follow-up: Return in about 4 months (around 09/19/2023) for chronic fasting follow-up.    SARA R Mallory Enriques, PA-C

## 2023-05-23 ENCOUNTER — Encounter: Payer: Self-pay | Admitting: Physician Assistant

## 2023-05-23 ENCOUNTER — Other Ambulatory Visit: Payer: Self-pay | Admitting: Cardiology

## 2023-05-23 LAB — COMPREHENSIVE METABOLIC PANEL
ALT: 40 [IU]/L (ref 0–44)
AST: 40 [IU]/L (ref 0–40)
Albumin: 4 g/dL (ref 3.9–4.9)
Alkaline Phosphatase: 103 [IU]/L (ref 44–121)
BUN/Creatinine Ratio: 10 (ref 10–24)
BUN: 11 mg/dL (ref 8–27)
Bilirubin Total: 0.5 mg/dL (ref 0.0–1.2)
CO2: 22 mmol/L (ref 20–29)
Calcium: 9 mg/dL (ref 8.6–10.2)
Chloride: 102 mmol/L (ref 96–106)
Creatinine, Ser: 1.05 mg/dL (ref 0.76–1.27)
Globulin, Total: 2.6 g/dL (ref 1.5–4.5)
Glucose: 99 mg/dL (ref 70–99)
Potassium: 4 mmol/L (ref 3.5–5.2)
Sodium: 142 mmol/L (ref 134–144)
Total Protein: 6.6 g/dL (ref 6.0–8.5)
eGFR: 80 mL/min/{1.73_m2} (ref >59)

## 2023-05-23 LAB — CBC WITH DIFFERENTIAL/PLATELET
Basophils Absolute: 0.1 10*3/uL (ref 0.0–0.2)
Basos: 1 %
EOS (ABSOLUTE): 0.3 10*3/uL (ref 0.0–0.4)
Eos: 4 %
Hematocrit: 47.4 % (ref 37.5–51.0)
Hemoglobin: 15.4 g/dL (ref 13.0–17.7)
Immature Grans (Abs): 0 10*3/uL (ref 0.0–0.1)
Immature Granulocytes: 0 %
Lymphocytes Absolute: 2.8 10*3/uL (ref 0.7–3.1)
Lymphs: 42 %
MCH: 31.6 pg (ref 26.6–33.0)
MCHC: 32.5 g/dL (ref 31.5–35.7)
MCV: 97 fL (ref 79–97)
Monocytes Absolute: 0.4 10*3/uL (ref 0.1–0.9)
Monocytes: 6 %
Neutrophils Absolute: 3.1 10*3/uL (ref 1.4–7.0)
Neutrophils: 47 %
Platelets: 148 10*3/uL — ABNORMAL LOW (ref 150–450)
RBC: 4.87 x10E6/uL (ref 4.14–5.80)
RDW: 13.3 % (ref 11.6–15.4)
WBC: 6.6 10*3/uL (ref 3.4–10.8)

## 2023-05-23 LAB — IRON,TIBC AND FERRITIN PANEL
Ferritin: 717 ng/mL — ABNORMAL HIGH (ref 30–400)
Iron Saturation: 27 % (ref 15–55)
Iron: 90 ug/dL (ref 38–169)
Total Iron Binding Capacity: 334 ug/dL (ref 250–450)
UIBC: 244 ug/dL (ref 111–343)

## 2023-05-23 LAB — LIPID PANEL
Chol/HDL Ratio: 3.7 {ratio} (ref 0.0–5.0)
Cholesterol, Total: 133 mg/dL (ref 100–199)
HDL: 36 mg/dL — ABNORMAL LOW (ref >39)
LDL Chol Calc (NIH): 74 mg/dL (ref 0–99)
Triglycerides: 127 mg/dL (ref 0–149)
VLDL Cholesterol Cal: 23 mg/dL (ref 5–40)

## 2023-05-23 LAB — HEMOGLOBIN A1C
Est. average glucose Bld gHb Est-mCnc: 120 mg/dL
Hgb A1c MFr Bld: 5.8 % — ABNORMAL HIGH (ref 4.8–5.6)

## 2023-05-23 LAB — B12 AND FOLATE PANEL
Folate: 15.8 ng/mL (ref >3.0)
Vitamin B-12: 1232 pg/mL (ref 232–1245)

## 2023-05-23 LAB — LEVETIRACETAM LEVEL: Levetiracetam Lvl: 15 ug/mL (ref 10.0–40.0)

## 2023-05-23 LAB — TSH: TSH: 1.46 u[IU]/mL (ref 0.450–4.500)

## 2023-05-23 LAB — VITAMIN D 25 HYDROXY (VIT D DEFICIENCY, FRACTURES): Vit D, 25-Hydroxy: 51.9 ng/mL (ref 30.0–100.0)

## 2023-05-24 MED ORDER — WEGOVY 1 MG/0.5ML ~~LOC~~ SOAJ: 1.0000 mg | SUBCUTANEOUS | 0 refills | Status: DC

## 2023-05-25 ENCOUNTER — Encounter: Payer: Self-pay | Admitting: Cardiology

## 2023-06-14 ENCOUNTER — Other Ambulatory Visit: Payer: Self-pay | Admitting: Physician Assistant

## 2023-06-14 ENCOUNTER — Encounter: Payer: Self-pay | Admitting: Physician Assistant

## 2023-06-14 MED ORDER — TRIAMCINOLONE ACETONIDE 0.1 % EX CREA: 1.0000 | TOPICAL_CREAM | Freq: Two times a day (BID) | CUTANEOUS | 0 refills | Status: DC

## 2023-06-15 DIAGNOSIS — G43709 Chronic migraine without aura, not intractable, without status migrainosus: Secondary | ICD-10-CM | POA: Diagnosis not present

## 2023-06-15 DIAGNOSIS — G4733 Obstructive sleep apnea (adult) (pediatric): Secondary | ICD-10-CM | POA: Diagnosis not present

## 2023-06-15 DIAGNOSIS — R42 Dizziness and giddiness: Secondary | ICD-10-CM | POA: Diagnosis not present

## 2023-06-15 DIAGNOSIS — R569 Unspecified convulsions: Secondary | ICD-10-CM | POA: Diagnosis not present

## 2023-06-15 DIAGNOSIS — R531 Weakness: Secondary | ICD-10-CM | POA: Diagnosis not present

## 2023-06-17 DIAGNOSIS — S0990XA Unspecified injury of head, initial encounter: Secondary | ICD-10-CM | POA: Diagnosis not present

## 2023-07-06 ENCOUNTER — Telehealth: Payer: Self-pay | Admitting: Pharmacist Clinician (PhC)/ Clinical Pharmacy Specialist

## 2023-07-06 ENCOUNTER — Other Ambulatory Visit: Payer: Self-pay | Admitting: Cardiology

## 2023-07-06 DIAGNOSIS — F5104 Psychophysiologic insomnia: Secondary | ICD-10-CM | POA: Insufficient documentation

## 2023-07-06 MED ORDER — WEGOVY 0.5 MG/0.5ML ~~LOC~~ SOAJ: 0.5000 mg | SUBCUTANEOUS | 0 refills | Status: DC

## 2023-07-06 NOTE — Telephone Encounter (Signed)
 Patient wife reached out via MyChart (her account not his), asking if he could Resume Wegovy.   PA is good for a year, so will have him resume.  Has been off for about 2 weeks.  Will check with patient, if was having side effects, will cut back to 0.5 mg but if was feeling fine, will restart at 1 mg.  Will send MyChart message through his account and let wife know that his messages need to be through his account.

## 2023-07-06 NOTE — Addendum Note (Signed)
 Addended by: Rosalee Kaufman on: 07/06/2023 02:57 PM   Modules accepted: Orders

## 2023-07-09 ENCOUNTER — Telehealth: Payer: Self-pay | Admitting: Cardiology

## 2023-07-09 DIAGNOSIS — I5032 Chronic diastolic (congestive) heart failure: Secondary | ICD-10-CM | POA: Diagnosis not present

## 2023-07-09 DIAGNOSIS — R0602 Shortness of breath: Secondary | ICD-10-CM | POA: Diagnosis not present

## 2023-07-09 NOTE — Telephone Encounter (Signed)
 Patient called to see if he can get approval for [facility] so he needs Dr. Ryan Coyer signature

## 2023-07-09 NOTE — Telephone Encounter (Signed)
 Spoke with pt, aware prescription for the wegovy was sent to [facility]. Patient did not need anything else.

## 2023-07-18 ENCOUNTER — Ambulatory Visit: Attending: Cardiovascular Disease | Admitting: Pharmacist Clinician (PhC)/ Clinical Pharmacy Specialist

## 2023-07-18 ENCOUNTER — Encounter: Payer: Self-pay | Admitting: Pharmacist Clinician (PhC)/ Clinical Pharmacy Specialist

## 2023-07-18 VITALS — Ht 64.0 in | Wt 304.2 lb

## 2023-07-18 DIAGNOSIS — R5383 Other fatigue: Secondary | ICD-10-CM | POA: Diagnosis not present

## 2023-07-18 DIAGNOSIS — J4540 Moderate persistent asthma, uncomplicated: Secondary | ICD-10-CM | POA: Diagnosis not present

## 2023-07-18 DIAGNOSIS — G4733 Obstructive sleep apnea (adult) (pediatric): Secondary | ICD-10-CM | POA: Diagnosis not present

## 2023-07-18 DIAGNOSIS — Z6841 Body Mass Index (BMI) 40.0 and over, adult: Secondary | ICD-10-CM

## 2023-07-18 DIAGNOSIS — R400 Somnolence: Secondary | ICD-10-CM | POA: Diagnosis not present

## 2023-07-18 MED ORDER — WEGOVY 1 MG/0.5ML ~~LOC~~ SOAJ: 1.0000 mg | SUBCUTANEOUS | 0 refills | Status: DC

## 2023-07-18 NOTE — Progress Notes (Signed)
 Office Visit    Patient Name: KONSTANTIN LEHNEN Date of Encounter: 07/18/2023  Primary Care Provider:  Cyndi Drain, PA-C Primary Cardiologist:  Kardie Tobb, DO  Chief Complaint    Weight management  Significant Past Medical History   preDM 10/24 A1c 5.9  HTN   CAD   CHF   arthritis In knees, limiting exercise    Allergies  Allergen Reactions   Ubrogepant Other (See Comments) and Swelling   Cephalexin Nausea And Vomiting, Rash and Other (See Comments)   Isosorbide  Nitrate Other (See Comments)    Blood pressure and heart rate decrease    History of Present Illness    HARISH BRAM is a 63 y.o. male patient of Dr Emmette Harms, in the office today to discuss options for weight management.  He was seen by Chris Pavero PharmD in January and was approved for Wegovy .  Currently he is on 0.5 mg and has 2 doses remaining at home.  He had originally worked up to the 1.7 mg dose, but stopped, believing it was causing dizziness.   He then realized that was associated with stopping another medication and he restarted the Ozempic.  Because it had been a few weeks, he restarted at 0.25 mg.      Current weight management medications:  Wegovy  0.5 mg weekly  Current meds that may affect weight: none  Baseline weight/BMI:  144.2 kg // 54.55 Today's weight/BMI:  138 kg // 52.19 Difference:   6.2 kg = 4.3%  Diet: high in meats and breads; avoids sugary drinks;  since starting Wegovy  notice that he get sick feeling if he eats too much and has cut back on his volume of food.  Eating at home a little more; snacking is nuts or fruit.     Exercise: none; problems with knees ; sob if walks too far.  Wants to join [employer] for [employer].   Confirmed patient has no personal or family history of medullary thyroid  carcinoma (MTC) or Multiple Endocrine Neoplasia syndrome type 2 (MEN 2).    Accessory Clinical Findings    Lab Results  Component Value Date   CREATININE 1.05 05/22/2023   BUN 11  05/22/2023   NA 142 05/22/2023   K 4.0 05/22/2023   CL 102 05/22/2023   CO2 22 05/22/2023   Lab Results  Component Value Date   ALT 40 05/22/2023   AST 40 05/22/2023   ALKPHOS 103 05/22/2023   BILITOT 0.5 05/22/2023   Lab Results  Component Value Date   HGBA1C 5.8 (H) 05/22/2023      Home Medications/Allergies    Current Outpatient Medications  Medication Sig Dispense Refill   Semaglutide -Weight Management (WEGOVY ) 1 MG/0.5ML SOAJ Inject 1 mg into the skin once a week. WHEN YOU FINISH THE 0.5 MG 2 mL 0   acetaminophen  (TYLENOL ) 500 MG tablet Take by mouth.     amitriptyline  (ELAVIL ) 50 MG tablet TAKE ONE TABLET BY MOUTH AT BEDTIME 90 tablet 1   baclofen (LIORESAL) 10 MG tablet Take 10-20 tablets by mouth every 6 (six) hours as needed (pain).     benzonatate  (TESSALON ) 200 MG capsule Take 1 capsule (200 mg total) by mouth 3 (three) times daily as needed for cough. 30 capsule 1   blood glucose meter kit and supplies KIT Dispense based on patient and insurance preference. Use up to four times daily as directed. 1 each 0   bumetanide  (BUMEX ) 2 MG tablet Take 1 tablet by mouth  twice daily. 180 tablet 3   busPIRone (BUSPAR) 30 MG tablet Take 30 mg by mouth 2 (two) times daily.     finasteride (PROSCAR) 5 MG tablet Take 5 mg by mouth daily.     GOODSENSE ASPIRIN  81 MG chewable tablet Chew 81 mg by mouth daily.     ipratropium-albuterol  (DUONEB) 0.5-2.5 (3) MG/3ML SOLN Inhale into the lungs.     ketoconazole  (NIZORAL ) 2 % shampoo Apply topically 2 (two) times a week. 120 mL 1   levETIRAcetam  (KEPPRA ) 750 MG tablet Take 1,500 mg by mouth 2 (two) times daily.     meclizine (ANTIVERT) 25 MG tablet Take 25 mg by mouth 3 (three) times daily as needed.     Misc Natural Products (URINOZINC PLUS PO) Take by mouth daily.     nitroGLYCERIN  (NITROSTAT ) 0.4 MG SL tablet Place 1 tablet (0.4 mg total) under the tongue every 5 (five) minutes as needed for chest pain. Up to three times. 45 tablet 3    omeprazole  (PRILOSEC) 40 MG capsule Take 1 capsule by mouth once daily. 30 capsule 3   OXYGEN  Inhale 2 L into the lungs daily as needed.     potassium chloride  SA (KLOR-CON  M) 20 MEQ tablet Take 1 tablet by mouth twice daily. 180 tablet 3   promethazine  (PHENERGAN ) 25 MG tablet Take 1 tablet by mouth every 6 hours as needed for nausea or vomiting. 30 tablet 0   ranolazine  (RANEXA ) 500 MG 12 hr tablet Take 1 tablet by mouth twice daily. 180 tablet 3   rosuvastatin  (CRESTOR ) 10 MG tablet TAKE 1 TABLET BY MOUTH ONCE DAILY. 90 tablet 3   sertraline (ZOLOFT) 100 MG tablet Take 200 mg by mouth daily.     tadalafil (CIALIS) 20 MG tablet SMARTSIG:1 Tablet(s) By Mouth     traZODone (DESYREL) 50 MG tablet Take 100 mg by mouth at bedtime as needed.     triamcinolone  cream (KENALOG ) 0.1 % Apply 1 Application topically 2 (two) times daily. 30 g 0   VENTOLIN  HFA 108 (90 Base) MCG/ACT inhaler INHALE TWO PUFFS INTO THE LUNGS EVERY 6 HOURS AS NEEDED FOR WHEEZING OR SHORTNESS OF BREATH 18 g 1   vitamin B-12 (CYANOCOBALAMIN ) 1000 MCG tablet Take 1,000 mcg by mouth daily.     Vitamin D , Ergocalciferol , (DRISDOL ) 1.25 MG (50000 UNIT) CAPS capsule Take 1 capsule by mouth once every 7 days as directed 12 capsule 1   No current facility-administered medications for this visit.     Allergies  Allergen Reactions   Ubrogepant Other (See Comments) and Swelling   Cephalexin Nausea And Vomiting, Rash and Other (See Comments)   Isosorbide  Nitrate Other (See Comments)    Blood pressure and heart rate decrease    Assessment & Plan    Morbid obesity with BMI of 50.0-59.9, adult (HCC) Patient re-started Wegovy  after having been up to 1.7 mg dose.  Stopped secondary to concerns it was causing dizziness.  Once realized this wasn't issue, he re-started, but we went back to beginning dose, as it had been several weeks.   Now back on x 6 weeks and tolerating 0.5 mg without issue.  Weight loss to date 4.3%, about 13 pounds.      Titration Plan:  Will plan to follow the titration plan as below, pending patient is tolerating each dose before increasing to the next. Can slow titration if needed for tolerability.    Finish last 2 doses of 0.5 mg Start 1 mg weekly x  4 weeks.  Patient will reach out when at the end of this dose to determine if okay to increase to 1.7 mg.    Follow up in 3 months.    Cleopha Indelicato PharmD CPP [facility]  [address] [place], [place] [address] [phone]

## 2023-07-18 NOTE — Assessment & Plan Note (Signed)
 Patient re-started Wegovy  after having been up to 1.7 mg dose.  Stopped secondary to concerns it was causing dizziness.  Once realized this wasn't issue, he re-started, but we went back to beginning dose, as it had been several weeks.   Now back on x 6 weeks and tolerating 0.5 mg without issue.  Weight loss to date 4.3%, about 13 pounds.     Titration Plan:  Will plan to follow the titration plan as below, pending patient is tolerating each dose before increasing to the next. Can slow titration if needed for tolerability.    Finish last 2 doses of 0.5 mg Start 1 mg weekly x 4 weeks.  Patient will reach out when at the end of this dose to determine if okay to increase to 1.7 mg.    Follow up in 3 months.

## 2023-07-18 NOTE — Patient Instructions (Signed)
  TIPS FOR SUCCESS Write down the reasons why you want to lose weight and post it in a place where you'll see it often. Start small and work your way up. Keep in mind that it takes time to achieve goals, and small steps add up. Any additional movements help to burn calories. Taking the stairs rather than the elevator and parking at the far end of your parking lot are easy ways to start. Brisk walking for at least 30 minutes 4 or more days of the week is an excellent goal to work toward  [employer] WHAT IT MEANS TO FEEL FULL Did you know that it can take 15 minutes or more for your brain to receive the message that you've eaten? That means that, if you eat less food, but consume it slower, you may still feel satisfied. Eating a lot of fruits and vegetables can also help you feel fuller. Eat off of smaller plates so that moderate portions don't seem too small  TITRATION PLAN Will plan to follow the titration plan as below, pending patient is tolerating each dose before increasing to the next. Can slow titration if needed for tolerability.    INCREASE TO 1 MG WITH NEXT FILL.  SEND ME A MYCHART MESSAGE WHEN YOU GET TO THE 4TH DOSE AND ARE READY TO INCREASE TO 1.7 MG  Follow up in 3  Months (we will schedule this at a later date)  If you have any questions or concerns, please reach out to us .  Liadan Guizar/Chris at [phone].  THANK YOU FOR CHOOSING [facility]

## 2023-07-19 DIAGNOSIS — G43709 Chronic migraine without aura, not intractable, without status migrainosus: Secondary | ICD-10-CM | POA: Diagnosis not present

## 2023-07-22 DIAGNOSIS — G4733 Obstructive sleep apnea (adult) (pediatric): Secondary | ICD-10-CM | POA: Diagnosis not present

## 2023-07-23 DIAGNOSIS — R569 Unspecified convulsions: Secondary | ICD-10-CM | POA: Diagnosis not present

## 2023-07-23 DIAGNOSIS — Z7982 Long term (current) use of aspirin: Secondary | ICD-10-CM | POA: Diagnosis not present

## 2023-07-23 DIAGNOSIS — Z79899 Other long term (current) drug therapy: Secondary | ICD-10-CM | POA: Diagnosis not present

## 2023-07-23 DIAGNOSIS — R9401 Abnormal electroencephalogram [EEG]: Secondary | ICD-10-CM | POA: Diagnosis not present

## 2023-07-24 DIAGNOSIS — G4733 Obstructive sleep apnea (adult) (pediatric): Secondary | ICD-10-CM | POA: Diagnosis not present

## 2023-07-26 ENCOUNTER — Other Ambulatory Visit: Payer: Self-pay | Admitting: Physician Assistant

## 2023-07-30 ENCOUNTER — Other Ambulatory Visit: Payer: Self-pay | Admitting: Physician Assistant

## 2023-07-30 DIAGNOSIS — R110 Nausea: Secondary | ICD-10-CM

## 2023-07-30 DIAGNOSIS — K219 Gastro-esophageal reflux disease without esophagitis: Secondary | ICD-10-CM

## 2023-08-01 DIAGNOSIS — G9389 Other specified disorders of brain: Secondary | ICD-10-CM | POA: Diagnosis not present

## 2023-08-01 DIAGNOSIS — R9401 Abnormal electroencephalogram [EEG]: Secondary | ICD-10-CM | POA: Diagnosis not present

## 2023-08-01 DIAGNOSIS — R569 Unspecified convulsions: Secondary | ICD-10-CM | POA: Diagnosis not present

## 2023-08-08 ENCOUNTER — Other Ambulatory Visit: Payer: Self-pay | Admitting: Physician Assistant

## 2023-08-08 ENCOUNTER — Other Ambulatory Visit: Payer: Self-pay | Admitting: Cardiology

## 2023-08-08 DIAGNOSIS — R0602 Shortness of breath: Secondary | ICD-10-CM | POA: Diagnosis not present

## 2023-08-08 DIAGNOSIS — I5032 Chronic diastolic (congestive) heart failure: Secondary | ICD-10-CM | POA: Diagnosis not present

## 2023-08-20 ENCOUNTER — Encounter: Payer: Self-pay | Admitting: Pharmacist Clinician (PhC)/ Clinical Pharmacy Specialist

## 2023-08-20 ENCOUNTER — Other Ambulatory Visit: Payer: Self-pay | Admitting: Cardiology

## 2023-08-21 DIAGNOSIS — G4733 Obstructive sleep apnea (adult) (pediatric): Secondary | ICD-10-CM | POA: Diagnosis not present

## 2023-08-21 MED ORDER — WEGOVY 1.7 MG/0.75ML ~~LOC~~ SOAJ: 1.7000 mg | SUBCUTANEOUS | 0 refills | Status: DC

## 2023-08-24 ENCOUNTER — Encounter: Payer: Self-pay | Admitting: Cardiology

## 2023-08-24 MED ORDER — ROSUVASTATIN CALCIUM 10 MG PO TABS: 10.0000 mg | ORAL_TABLET | Freq: Every day | ORAL | 0 refills | Status: DC

## 2023-08-24 MED ORDER — RANOLAZINE ER 500 MG PO TB12: 500.0000 mg | ORAL_TABLET | Freq: Two times a day (BID) | ORAL | 0 refills | Status: DC

## 2023-08-24 MED ORDER — BUMETANIDE 2 MG PO TABS: 2.0000 mg | ORAL_TABLET | Freq: Two times a day (BID) | ORAL | 0 refills | Status: AC

## 2023-09-05 DIAGNOSIS — G4733 Obstructive sleep apnea (adult) (pediatric): Secondary | ICD-10-CM | POA: Diagnosis not present

## 2023-09-06 ENCOUNTER — Other Ambulatory Visit: Payer: Self-pay | Admitting: Physician Assistant

## 2023-09-08 DIAGNOSIS — I5032 Chronic diastolic (congestive) heart failure: Secondary | ICD-10-CM | POA: Diagnosis not present

## 2023-09-08 DIAGNOSIS — R0602 Shortness of breath: Secondary | ICD-10-CM | POA: Diagnosis not present

## 2023-09-11 ENCOUNTER — Other Ambulatory Visit: Payer: Self-pay | Admitting: Cardiology

## 2023-09-13 ENCOUNTER — Encounter: Payer: Self-pay | Admitting: Pharmacist Clinician (PhC)/ Clinical Pharmacy Specialist

## 2023-09-14 MED ORDER — WEGOVY 2.4 MG/0.75ML ~~LOC~~ SOAJ
2.4000 mg | SUBCUTANEOUS | 2 refills | Status: DC
Start: 2023-09-14 — End: 2023-10-12

## 2023-09-17 ENCOUNTER — Other Ambulatory Visit: Payer: Self-pay | Admitting: Cardiology

## 2023-09-21 DIAGNOSIS — G4733 Obstructive sleep apnea (adult) (pediatric): Secondary | ICD-10-CM | POA: Diagnosis not present

## 2023-09-24 ENCOUNTER — Other Ambulatory Visit: Payer: Self-pay | Admitting: Family Medicine

## 2023-09-26 ENCOUNTER — Ambulatory Visit (INDEPENDENT_AMBULATORY_CARE_PROVIDER_SITE_OTHER): Payer: Medicare Other | Admitting: Physician Assistant

## 2023-09-26 ENCOUNTER — Encounter: Payer: Self-pay | Admitting: Physician Assistant

## 2023-09-26 VITALS — BP 116/78 | HR 84 | Temp 97.4°F | Ht 64.0 in | Wt 294.8 lb

## 2023-09-26 DIAGNOSIS — E782 Mixed hyperlipidemia: Secondary | ICD-10-CM | POA: Diagnosis not present

## 2023-09-26 DIAGNOSIS — E559 Vitamin D deficiency, unspecified: Secondary | ICD-10-CM

## 2023-09-26 DIAGNOSIS — G4739 Other sleep apnea: Secondary | ICD-10-CM

## 2023-09-26 DIAGNOSIS — I5032 Chronic diastolic (congestive) heart failure: Secondary | ICD-10-CM

## 2023-09-26 DIAGNOSIS — R569 Unspecified convulsions: Secondary | ICD-10-CM | POA: Diagnosis not present

## 2023-09-26 DIAGNOSIS — K219 Gastro-esophageal reflux disease without esophagitis: Secondary | ICD-10-CM

## 2023-09-26 DIAGNOSIS — J410 Simple chronic bronchitis: Secondary | ICD-10-CM | POA: Diagnosis not present

## 2023-09-26 DIAGNOSIS — I10 Essential (primary) hypertension: Secondary | ICD-10-CM

## 2023-09-26 DIAGNOSIS — R7303 Prediabetes: Secondary | ICD-10-CM | POA: Diagnosis not present

## 2023-09-26 DIAGNOSIS — C61 Malignant neoplasm of prostate: Secondary | ICD-10-CM

## 2023-09-26 DIAGNOSIS — R7989 Other specified abnormal findings of blood chemistry: Secondary | ICD-10-CM | POA: Diagnosis not present

## 2023-09-26 DIAGNOSIS — I25118 Atherosclerotic heart disease of native coronary artery with other forms of angina pectoris: Secondary | ICD-10-CM

## 2023-09-26 DIAGNOSIS — E538 Deficiency of other specified B group vitamins: Secondary | ICD-10-CM

## 2023-09-26 DIAGNOSIS — F332 Major depressive disorder, recurrent severe without psychotic features: Secondary | ICD-10-CM

## 2023-09-26 NOTE — Progress Notes (Signed)
 Established Patient Office Visit  Subjective:  Patient ID: Randall Rojas, male    DOB: September 12, 1960  Age: 63 y.o. MRN: [id number]  CC:  Chief Complaint  Patient presents with   Medical Management of Chronic Issues    HPI Randall Rojas presents for follow up of chronic medical issues   Mixed hyperlipidemia  Pt presents with hyperlipidemia. Compliance with treatment has been fair; The patient maintains a low cholesterol diet , follows up as directed ,  The patient denies experiencing any hypercholesterolemia related symptoms. -- he also has coronary artery disease - follows with Dr Sheena (cardiology) regularly Currently on crestor  10mg  every day as well as asa 81mg  every day and ranexa  500mg  bid Pt also diagnosed with CHF - he takes bumex  2mg  in addition to other medications Pt denies chest pain or dyspnea today  Pt continues to follow with neurology for history of headaches , history of TIA  and seizure like activity- he does follow up with them regularly -- provider amy Livingston NP He is taking keppra  750mg  qd- will get level today with labwork  Pt  has anxiety/depression and takes zoloft 100 , trazadone 50mg , elavil  50mg  and buspar 30.  He does see a Veterinary surgeon - states symptoms are stable at this time and also follows with psychiatry - has appt later this morning  Pt with GERD - pt using omeprazole  40mg  as directed  Pt with Vit D def - taking weekly supplement - due for labwork  Pt has vit B12 def - taking otc supplement - due for labwork - he did decrease to only taking every other day  Pt has been diagnosed with prostate cancer - he is following with Dr Marda - he is currently on proscar 5mg  qd and cialis 20mg  He also takes urinozinc qd - will have follow up appt in few months - last PSA 10/24 was normal  Pt currently on Wegovy  for weight management with CAD - tolerating medication well  He is at 2.4mg  weekly dose and has lost 10 pounds since last visit He states he is trying to  watch diet but because of other health issues is hard for him to exercise  Pt with elevated ferritin - has consulted with hematology and stable at this time  Pt with chronic bronchitis and sleep apnea.  He follows with pulmonologist Dr Mardee regularly - He uses duoneb as needed, albuterol  prn, trelegy as directed.  He does use a CPAP at night with 1L oxygen .  He also takes provigil  Pt with chronic low back pain and knee pain.  He has seen ortho in past for these issues.  Currently uses ibuprofen prn, baclofen prn  Past Medical History:  Diagnosis Date   Acute laryngopharyngitis 11/11/2020   Acute URI 12/12/2019   Anxiety 06/17/2020   Arthralgia 06/17/2020   Asthma    Bilateral leg edema 06/23/2020   Chest pain 04/15/2015   Chronic diastolic heart failure (HCC) 08/05/2020   Chronic heart failure with preserved ejection fraction (HCC) 07/11/2020   Coronary artery calcification seen on CT scan 04/15/2015   Coronary artery disease    Coronary artery disease involving native coronary artery of native heart without angina pectoris 07/11/2020   Coronary artery disease of native artery of native heart with stable angina pectoris (HCC) 06/23/2020   Essential hypertension 04/15/2015   Exertional dyspnea 05/26/2020   Frequent headaches 07/20/2020   GERD (gastroesophageal reflux disease) 05/27/2019   History of CVA in adulthood 08/05/2020  HLD (hyperlipidemia) 07/11/2020   Hyperglycemia 06/06/2015   Hyperlipidemia 04/15/2015   Hypertension 08/05/2020   Hypokalemia 07/20/2020   Left sided numbness 07/25/2020   Leukocytosis 06/06/2015   Malaise 06/17/2020   Mixed hyperlipidemia 04/15/2015   Morbid obesity (HCC) 04/15/2015   Need for prophylactic vaccination against Streptococcus pneumoniae (pneumococcus) 12/03/2019   Need for prophylactic vaccination and inoculation against influenza 12/03/2019   Need for tetanus booster 06/17/2020   Nonepileptic episode (HCC) 10/20/2020   Obesity, Class III, BMI 40-49.9 (morbid obesity)  04/15/2015   Other sleep apnea 07/20/2020   Palpitations 08/05/2020   Pneumonia due to COVID-19 virus 04/06/2019   Prediabetes 06/23/2020   Prostate cancer screening 06/02/2019   Rectal bleeding 11/01/2020   Seizure-like activity (HCC) 05/26/2020   Shortness of breath 06/23/2020   TIA (transient ischemic attack)    Upper extremity weakness 05/26/2020   Weakness of left lower extremity 05/26/2020    Past Surgical History:  Procedure Laterality Date   CARDIAC CATHETERIZATION N/A 04/22/2015   Procedure: Left Heart Cath and Coronary Angiography;  Surgeon: Peter M Swaziland, MD;  Location: [facility] INVASIVE CV LAB;  Service: Cardiovascular;  Laterality: N/A;   GALLBLADDER SURGERY     LEFT HEART CATH AND CORONARY ANGIOGRAPHY N/A 10/28/2020   Procedure: LEFT HEART CATH AND CORONARY ANGIOGRAPHY;  Surgeon: Burnard Debby LABOR, MD;  Location: [facility] INVASIVE CV LAB;  Service: Cardiovascular;  Laterality: N/A;   SINUS SURGERY WITH INSTATRAK      Family History  Problem Relation Age of Onset   Hypertension Mother    Rheum arthritis Mother    Heart attack Father    Prostate cancer Father        METS TO BRAIN; H/O SKIN CANCER   Hypertension Sister    Heart disease Sister    Heart attack Sister    Hypertension Sister    Prostate cancer Brother        METS TO BONE AND LIVER   Breast cancer Paternal Aunt    Prostate cancer Paternal Uncle        METS TO LYMPH NODES   Cancer Paternal Uncle        UNKNOWN PRIMARY ORAL MALIGNANCY   Breast cancer Cousin     Social History   Socioeconomic History   Marital status: Married    Spouse name: [name]   Number of children: 3   Years of education: 12   Highest education level: GED or equivalent  Occupational History   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   Occupation: DISABLED  Tobacco Use   Smoking status: Former    Current packs/day: 0.00    Types: Cigarettes    Quit date: 04/13/1989    Years since quitting: 34.4   Smokeless tobacco: Never  Vaping Use   Vaping status: Never  Used  Substance and Sexual Activity   Alcohol use: Never   Drug use: Never   Sexual activity: Not Currently  Other Topics Concern   Not on file  Social History Narrative   Epworth Sleepiness Scale = 7 (as of 1/18/207)   Social Drivers of Health   Financial Resource Strain: Low Risk  (09/20/2023)   Overall Financial Resource Strain (CARDIA)    Difficulty of Paying Living Expenses: Not very hard  Food Insecurity: Food Insecurity Present (09/20/2023)   Hunger Vital Sign    Worried About Running Out of Food in the Last Year: Sometimes true    Ran Out of Food in the Last Year: Sometimes true  Transportation  Needs: No Transportation Needs (09/20/2023)   PRAPARE - Administrator, Civil Service (Medical): No    Lack of Transportation (Non-Medical): No  Physical Activity: Inactive (09/20/2023)   Exercise Vital Sign    Days of Exercise per Week: 0 days    Minutes of Exercise per Session: Not on file  Stress: No Stress Concern Present (09/20/2023)   [employer] of Occupational Health - Occupational Stress Questionnaire    Feeling of Stress: Only a little  Social Connections: Socially Integrated (09/20/2023)   Social Connection and Isolation Panel    Frequency of Communication with Friends and Family: More than three times a week    Frequency of Social Gatherings with Friends and Family: Three times a week    Attends Religious Services: More than 4 times per year    Active Member of Clubs or Organizations: Yes    Attends Banker Meetings: More than 4 times per year    Marital Status: Married  Catering manager Violence: Not At Risk (02/08/2023)   Humiliation, Afraid, Rape, and Kick questionnaire    Fear of Current or Ex-Partner: No    Emotionally Abused: No    Physically Abused: No    Sexually Abused: No     Current Outpatient Medications:    Accu-Chek Softclix Lancets lancets, Use up to 4 times daily as directed., Disp: 100 each, Rfl: 0   acetaminophen   (TYLENOL ) 500 MG tablet, Take by mouth., Disp: , Rfl:    amitriptyline  (ELAVIL ) 50 MG tablet, TAKE ONE TABLET BY MOUTH AT BEDTIME, Disp: 90 tablet, Rfl: 1   baclofen (LIORESAL) 10 MG tablet, Take 10-20 tablets by mouth every 6 (six) hours as needed (pain)., Disp: , Rfl:    blood glucose meter kit and supplies KIT, Dispense based on patient and insurance preference. Use up to four times daily as directed., Disp: 1 each, Rfl: 0   bumetanide  (BUMEX ) 2 MG tablet, Take 1 tablet (2 mg total) by mouth 2 (two) times daily., Disp: 180 tablet, Rfl: 0   busPIRone (BUSPAR) 30 MG tablet, Take 30 mg by mouth 2 (two) times daily., Disp: , Rfl:    finasteride (PROSCAR) 5 MG tablet, Take 5 mg by mouth daily., Disp: , Rfl:    GOODSENSE ASPIRIN  81 MG chewable tablet, Chew 81 mg by mouth daily., Disp: , Rfl:    ibuprofen (ADVIL) 800 MG tablet, Take 800 mg by mouth 3 (three) times daily as needed., Disp: , Rfl:    ipratropium-albuterol  (DUONEB) 0.5-2.5 (3) MG/3ML SOLN, Inhale into the lungs., Disp: , Rfl:    ketoconazole  (NIZORAL ) 2 % shampoo, Shampoo twice a week as directed., Disp: 120 mL, Rfl: 0   levETIRAcetam  (KEPPRA ) 750 MG tablet, Take 1,500 mg by mouth 2 (two) times daily., Disp: , Rfl:    meclizine (ANTIVERT) 25 MG tablet, Take 25 mg by mouth 3 (three) times daily as needed., Disp: , Rfl:    Misc Natural Products (URINOZINC PLUS PO), Take by mouth daily., Disp: , Rfl:    montelukast (SINGULAIR) 10 MG tablet, Take 10 mg by mouth at bedtime., Disp: , Rfl:    nitroGLYCERIN  (NITROSTAT ) 0.4 MG SL tablet, Place 1 tablet (0.4 mg total) under the tongue every 5 (five) minutes as needed for chest pain. Up to three times., Disp: 45 tablet, Rfl: 3   omeprazole  (PRILOSEC) 40 MG capsule, Take 1 capsule by mouth once daily., Disp: 90 capsule, Rfl: 1   OXYGEN , Inhale 2 L into the lungs  daily as needed., Disp: , Rfl:    potassium chloride  SA (KLOR-CON  M) 20 MEQ tablet, Take 1 tablet by mouth twice daily., Disp: 180 tablet,  Rfl: 3   promethazine  (PHENERGAN ) 25 MG tablet, Take 1 tablet by mouth every 6 hours as needed for nausea or vomiting., Disp: 30 tablet, Rfl: 1   ranolazine  (RANEXA ) 500 MG 12 hr tablet, Take 1 tablet (500 mg total) by mouth 2 (two) times daily., Disp: 180 tablet, Rfl: 0   rosuvastatin  (CRESTOR ) 10 MG tablet, Take 1 tablet (10 mg total) by mouth daily., Disp: 90 tablet, Rfl: 0   Semaglutide -Weight Management (WEGOVY ) 2.4 MG/0.75ML SOAJ, Inject 2.4 mg into the skin every 7 (seven) days., Disp: 3 mL, Rfl: 2   sertraline (ZOLOFT) 100 MG tablet, Take 200 mg by mouth daily., Disp: , Rfl:    tadalafil (CIALIS) 20 MG tablet, SMARTSIG:1 Tablet(s) By Mouth, Disp: , Rfl:    traZODone (DESYREL) 50 MG tablet, Take 100 mg by mouth at bedtime as needed., Disp: , Rfl:    triamcinolone  cream (KENALOG ) 0.1 %, Apply to the affected area(s) twice a day as directed, Disp: 30 g, Rfl: 0   VENTOLIN  HFA 108 (90 Base) MCG/ACT inhaler, INHALE TWO PUFFS INTO THE LUNGS EVERY 6 HOURS AS NEEDED FOR WHEEZING OR SHORTNESS OF BREATH, Disp: 18 g, Rfl: 1   vitamin B-12 (CYANOCOBALAMIN ) 1000 MCG tablet, Take 1,000 mcg by mouth daily., Disp: , Rfl:    Vitamin D , Ergocalciferol , (DRISDOL ) 1.25 MG (50000 UNIT) CAPS capsule, Take 1 capsule by mouth once every 7 days as directed, Disp: 12 capsule, Rfl: 1   modafinil (PROVIGIL) 200 MG tablet, Take 200 mg by mouth 2 (two) times daily., Disp: , Rfl:    TRELEGY ELLIPTA 100-62.5-25 MCG/ACT AEPB, Inhale 1 puff into the lungs daily., Disp: , Rfl:    Allergies  Allergen Reactions   Ubrogepant Other (See Comments) and Swelling   Cephalexin Nausea And Vomiting, Rash and Other (See Comments)   Isosorbide  Nitrate Other (See Comments)    Blood pressure and heart rate decrease   CONSTITUTIONAL: Negative for chills, fatigue, fever, unintentional weight gain and unintentional weight loss.  E/N/T: Negative for ear pain, nasal congestion and sore throat.  CARDIOVASCULAR: Negative for chest pain,  dizziness, palpitations and pedal edema.  RESPIRATORY: Negative for recent cough and dyspnea.  GASTROINTESTINAL: Negative for abdominal pain, acid reflux symptoms, constipation, diarrhea, nausea and vomiting.  MSK: see HPI INTEGUMENTARY: Negative for rash.  NEUROLOGICAL: Negative for dizziness and headaches.  PSYCHIATRIC: Negative for sleep disturbance and to question depression screen.  Negative for depression, negative for anhedonia.       Objective:  PHYSICAL EXAM:   VS: BP 116/78   Pulse 84   Temp (!) 97.4 F (36.3 C)   Ht 5' 4 (1.626 m)   Wt 294 lb 12.8 oz (133.7 kg)   SpO2 93%   BMI 50.60 kg/m   GEN: Well nourished, well developed, in no acute distress  Cardiac: RRR; no murmurs, rubs, or gallops,no edema -  Respiratory:  normal respiratory rate and pattern with no distress - normal breath sounds with no rales, rhonchi, wheezes or rubs MS: no deformity or atrophy - uses cane today Skin: warm and dry, no rash  Neuro:  Alert and Oriented x 3, - CN II-Xii grossly intact Psych: euthymic mood, appropriate affect and demeanor  There are no preventive care reminders to display for this patient.  Lab Results  Component Value Date  TSH 1.460 05/22/2023   Lab Results  Component Value Date   WBC 6.6 05/22/2023   HGB 15.4 05/22/2023   HCT 47.4 05/22/2023   MCV 97 05/22/2023   PLT 148 (L) 05/22/2023   Lab Results  Component Value Date   NA 142 05/22/2023   K 4.0 05/22/2023   CO2 22 05/22/2023   GLUCOSE 99 05/22/2023   BUN 11 05/22/2023   CREATININE 1.05 05/22/2023   BILITOT 0.5 05/22/2023   ALKPHOS 103 05/22/2023   AST 40 05/22/2023   ALT 40 05/22/2023   PROT 6.6 05/22/2023   ALBUMIN 4.0 05/22/2023   CALCIUM  9.0 05/22/2023   ANIONGAP 11 04/07/2019   EGFR 80 05/22/2023   Lab Results  Component Value Date   CHOL 133 05/22/2023   Lab Results  Component Value Date   HDL 36 (L) 05/22/2023   Lab Results  Component Value Date   LDLCALC 74 05/22/2023   Lab  Results  Component Value Date   TRIG 127 05/22/2023   Lab Results  Component Value Date   CHOLHDL 3.7 05/22/2023   Lab Results  Component Value Date   HGBA1C 5.8 (H) 05/22/2023      Assessment & Plan:   Problem List Items Addressed This Visit       Elevated ferritin   Labwork pending   Follow up with hematology as directed      Prediabetes Watch diet   Hgb A1c pending      Hypertension Continue meds  Labwork pending        Mixed hyperlipidemia   Relevant Medications    Continue crestor  10mg  qd   Other Relevant Orders   Lipid panel Continue meds Watch diet    Seizure like activity (HCC) Continue meds and follow up with neurology as directed Continue ASA Keppra  level pending  GERD Rx for omeprazole  as directed   Coronary artery disease with stable angina pectoris (HCC) Continue meds as directed Labwork pending Follow up with cardiology as directed  Chronic diastolic heart failure (HCC) Continue meds  Continue follow up with cardiology  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    Follow-up: Return in about 4 months (around 01/27/2024) for chronic fasting follow-up.    SARA R Reigan Tolliver, PA-C

## 2023-09-28 LAB — HEMOGLOBIN A1C
Est. average glucose Bld gHb Est-mCnc: 111 mg/dL
Hgb A1c MFr Bld: 5.5 % (ref 4.8–5.6)

## 2023-09-28 LAB — CBC WITH DIFFERENTIAL/PLATELET
Basophils Absolute: 0.1 x10E3/uL (ref 0.0–0.2)
Basos: 1 %
EOS (ABSOLUTE): 0.3 x10E3/uL (ref 0.0–0.4)
Eos: 3 %
Hematocrit: 48.4 % (ref 37.5–51.0)
Hemoglobin: 15.8 g/dL (ref 13.0–17.7)
Immature Grans (Abs): 0 x10E3/uL (ref 0.0–0.1)
Immature Granulocytes: 0 %
Lymphocytes Absolute: 3.7 x10E3/uL — ABNORMAL HIGH (ref 0.7–3.1)
Lymphs: 44 %
MCH: 31.5 pg (ref 26.6–33.0)
MCHC: 32.6 g/dL (ref 31.5–35.7)
MCV: 97 fL (ref 79–97)
Monocytes Absolute: 0.3 x10E3/uL (ref 0.1–0.9)
Monocytes: 3 %
Neutrophils Absolute: 4 x10E3/uL (ref 1.4–7.0)
Neutrophils: 49 %
Platelets: 150 x10E3/uL (ref 150–450)
RBC: 5.01 x10E6/uL (ref 4.14–5.80)
RDW: 13.1 % (ref 11.6–15.4)
WBC: 8.3 x10E3/uL (ref 3.4–10.8)

## 2023-09-28 LAB — LIPID PANEL
Chol/HDL Ratio: 3.5 ratio (ref 0.0–5.0)
Cholesterol, Total: 122 mg/dL (ref 100–199)
HDL: 35 mg/dL — ABNORMAL LOW (ref >39)
LDL Chol Calc (NIH): 59 mg/dL (ref 0–99)
Triglycerides: 164 mg/dL — ABNORMAL HIGH (ref 0–149)
VLDL Cholesterol Cal: 28 mg/dL (ref 5–40)

## 2023-09-28 LAB — VITAMIN D 25 HYDROXY (VIT D DEFICIENCY, FRACTURES): Vit D, 25-Hydroxy: 68 ng/mL (ref 30.0–100.0)

## 2023-09-28 LAB — COMPREHENSIVE METABOLIC PANEL WITH GFR
ALT: 25 IU/L (ref 0–44)
AST: 24 IU/L (ref 0–40)
Albumin: 4.1 g/dL (ref 3.9–4.9)
Alkaline Phosphatase: 107 IU/L (ref 44–121)
BUN/Creatinine Ratio: 10 (ref 10–24)
BUN: 11 mg/dL (ref 8–27)
Bilirubin Total: 0.7 mg/dL (ref 0.0–1.2)
CO2: 22 mmol/L (ref 20–29)
Calcium: 9.2 mg/dL (ref 8.6–10.2)
Chloride: 100 mmol/L (ref 96–106)
Creatinine, Ser: 1.08 mg/dL (ref 0.76–1.27)
Globulin, Total: 2.7 g/dL (ref 1.5–4.5)
Glucose: 124 mg/dL — ABNORMAL HIGH (ref 70–99)
Potassium: 3.7 mmol/L (ref 3.5–5.2)
Sodium: 140 mmol/L (ref 134–144)
Total Protein: 6.8 g/dL (ref 6.0–8.5)
eGFR: 78 mL/min/1.73 (ref >59)

## 2023-09-28 LAB — IRON,TIBC AND FERRITIN PANEL
Ferritin: 535 ng/mL — ABNORMAL HIGH (ref 30–400)
Iron Saturation: 31 % (ref 15–55)
Iron: 102 ug/dL (ref 38–169)
Total Iron Binding Capacity: 330 ug/dL (ref 250–450)
UIBC: 228 ug/dL (ref 111–343)

## 2023-09-28 LAB — TSH: TSH: 2.04 u[IU]/mL (ref 0.450–4.500)

## 2023-09-28 LAB — B12 AND FOLATE PANEL
Folate: 9.7 ng/mL (ref >3.0)
Vitamin B-12: 1187 pg/mL (ref 232–1245)

## 2023-09-28 LAB — LEVETIRACETAM LEVEL: Levetiracetam Lvl: 17.6 ug/mL (ref 10.0–40.0)

## 2023-10-01 ENCOUNTER — Ambulatory Visit: Payer: Self-pay | Admitting: Physician Assistant

## 2023-10-08 ENCOUNTER — Other Ambulatory Visit: Payer: Self-pay | Admitting: Physician Assistant

## 2023-10-08 DIAGNOSIS — R0602 Shortness of breath: Secondary | ICD-10-CM | POA: Diagnosis not present

## 2023-10-08 DIAGNOSIS — I5032 Chronic diastolic (congestive) heart failure: Secondary | ICD-10-CM | POA: Diagnosis not present

## 2023-10-10 ENCOUNTER — Encounter: Payer: Self-pay | Admitting: Pharmacist Clinician (PhC)/ Clinical Pharmacy Specialist

## 2023-10-12 MED ORDER — WEGOVY 2.4 MG/0.75ML ~~LOC~~ SOAJ: 2.4000 mg | SUBCUTANEOUS | 1 refills | Status: DC

## 2023-10-15 ENCOUNTER — Other Ambulatory Visit: Payer: Self-pay

## 2023-10-15 ENCOUNTER — Encounter: Payer: Self-pay | Admitting: Cardiology

## 2023-10-15 DIAGNOSIS — G4733 Obstructive sleep apnea (adult) (pediatric): Secondary | ICD-10-CM

## 2023-10-15 NOTE — Progress Notes (Signed)
Referral for pulmonary placed

## 2023-10-18 DIAGNOSIS — G43709 Chronic migraine without aura, not intractable, without status migrainosus: Secondary | ICD-10-CM | POA: Diagnosis not present

## 2023-10-21 DIAGNOSIS — G4733 Obstructive sleep apnea (adult) (pediatric): Secondary | ICD-10-CM | POA: Diagnosis not present

## 2023-10-23 DIAGNOSIS — G4733 Obstructive sleep apnea (adult) (pediatric): Secondary | ICD-10-CM | POA: Diagnosis not present

## 2023-10-26 ENCOUNTER — Telehealth: Payer: Self-pay | Admitting: Oncology

## 2023-10-26 DIAGNOSIS — G4733 Obstructive sleep apnea (adult) (pediatric): Secondary | ICD-10-CM | POA: Diagnosis not present

## 2023-10-26 NOTE — Telephone Encounter (Signed)
 10/26/23 Spoke with patient and scheduled lab/appt -Per Camie Moats.

## 2023-11-05 DIAGNOSIS — G0481 Other encephalitis and encephalomyelitis: Secondary | ICD-10-CM | POA: Diagnosis not present

## 2023-11-08 DIAGNOSIS — R0602 Shortness of breath: Secondary | ICD-10-CM | POA: Diagnosis not present

## 2023-11-08 DIAGNOSIS — I5032 Chronic diastolic (congestive) heart failure: Secondary | ICD-10-CM | POA: Diagnosis not present

## 2023-11-12 ENCOUNTER — Other Ambulatory Visit: Payer: Self-pay | Admitting: Cardiology

## 2023-11-12 ENCOUNTER — Other Ambulatory Visit: Payer: Self-pay | Admitting: Oncology

## 2023-11-12 DIAGNOSIS — R7989 Other specified abnormal findings of blood chemistry: Secondary | ICD-10-CM

## 2023-11-12 NOTE — Progress Notes (Unsigned)
 [facility]  [address] [place],  [place]  [address] [phone]  Clinic Day:  11/13/2023  Referring physician: Nicholaus Credit, PA-C   HISTORY OF PRESENT ILLNESS:  The patient is a 63 y.o. male  who I was asked to reevaluate for an elevated ferritin level.  Of note, at his clinic visit in 2024, hemochromatosis testing came back negative for this iron  overload disorder being present.  Furthermore, over the past year, this gentleman's ferritin level has been gradually falling.  He comes in today to reassess this level.  Since his last visit, the patient has been doing fairly well.  He did have some neurological issues recently, for which he has been evaluated by neurology for the possibility of having some type of cerebral vascular insult.  Otherwise, there have been no particular changes in his health.  In the past, I attributed his elevated ferritin to be an acute phase reactant secondary to his multiple medical issues, including congestive heart failure, cerebrovascular disease, coronary artery disease, and prostate cancer.    PHYSICAL EXAM:  Blood pressure 135/85, pulse 80, temperature 98.6 F (37 C), temperature source Oral, resp. rate 18, height 5' 4 (1.626 m), weight 291 lb 12.8 oz (132.4 kg), SpO2 96%. Wt Readings from Last 3 Encounters:  11/13/23 291 lb 12.8 oz (132.4 kg)  09/26/23 294 lb 12.8 oz (133.7 kg)  07/18/23 (!) 304 lb 3.2 oz (138 kg)   Body mass index is 50.09 kg/m. Performance status (ECOG): 1 - Symptomatic but completely ambulatory Physical Exam Constitutional:      Appearance: Normal appearance. He is not ill-appearing.  HENT:     Mouth/Throat:     Mouth: Mucous membranes are moist.     Pharynx: Oropharynx is clear. No oropharyngeal exudate or posterior oropharyngeal erythema.  Cardiovascular:     Rate and Rhythm: Normal rate and regular rhythm.     Heart sounds: No murmur heard.    No friction rub. No gallop.  Pulmonary:      Effort: Pulmonary effort is normal. No respiratory distress.     Breath sounds: Normal breath sounds. No wheezing, rhonchi or rales.  Abdominal:     General: Bowel sounds are normal. There is no distension.     Palpations: Abdomen is soft. There is no mass.     Tenderness: There is no abdominal tenderness.  Musculoskeletal:        General: No swelling.     Right lower leg: No edema.     Left lower leg: No edema.  Lymphadenopathy:     Cervical: No cervical adenopathy.     Upper Body:     Right upper body: No supraclavicular or axillary adenopathy.     Left upper body: No supraclavicular or axillary adenopathy.     Lower Body: No right inguinal adenopathy. No left inguinal adenopathy.  Skin:    General: Skin is warm.     Coloration: Skin is not jaundiced.     Findings: No lesion or rash.  Neurological:     General: No focal deficit present.     Mental Status: He is alert and oriented to person, place, and time. Mental status is at baseline.  Psychiatric:        Mood and Affect: Mood normal.        Behavior: Behavior normal.        Thought Content: Thought content normal.    LABS:      Latest Ref Rng &  Units 11/13/2023    9:13 AM 09/26/2023    9:39 AM 05/22/2023   10:40 AM  CBC  WBC 4.0 - 10.5 K/uL 7.8  8.3  6.6   Hemoglobin 13.0 - 17.0 g/dL 85.5  84.1  84.5   Hematocrit 39.0 - 52.0 % 42.3  48.4  47.4   Platelets 150 - 400 K/uL 152  150  148       Latest Ref Rng & Units 11/13/2023    9:13 AM 09/26/2023    9:39 AM 05/22/2023   10:40 AM  CMP  Glucose 70 - 99 mg/dL 879  875  99   BUN 8 - 23 mg/dL 12  11  11    Creatinine 0.61 - 1.24 mg/dL 9.15  8.91  8.94   Sodium 135 - 145 mmol/L 142  140  142   Potassium 3.5 - 5.1 mmol/L 3.6  3.7  4.0   Chloride 98 - 111 mmol/L 107  100  102   CO2 22 - 32 mmol/L 24  22  22    Calcium  8.9 - 10.3 mg/dL 8.9  9.2  9.0   Total Protein 6.5 - 8.1 g/dL 6.6  6.8  6.6   Total Bilirubin 0.0 - 1.2 mg/dL 0.6  0.7  0.5   Alkaline Phos 38 - 126 U/L 104   107  103   AST 15 - 41 U/L 22  24  40   ALT 0 - 44 U/L 25  25  40     Latest Reference Range & Units 05/22/23 10:40 09/26/23 09:39 11/13/23 09:13  Iron  45 - 182 ug/dL 90 897 79  UIBC ug/dL 755 771 694  TIBC 749 - 450 ug/dL 665 669 615  Saturation Ratios 17.9 - 39.5 %   21  Ferritin 24 - 336 ng/mL 717 (H) 535 (H) 225  (H): Data is abnormally high  Hemochromatosis test results: c.845G>A (p.Cys282Tyr) - Not Detected  c.187C>G (p.His63Asp) - Not Detected  c.193A>T (p.Ser65Cys) - Not Detected  Not associated with increased risk to develop clinical symptoms  of Hereditary Hemochromatosis. In symptomatic individuals, other  causes of iron  overload should be evaluated.   ASSESSMENT & PLAN:  A 63 y.o. male with a history of an elevated ferritin level.  I am particularly pleased with this gentleman's labs today, which shows that his ferritin level has completely normalized.  As I have mentioned to the patient before, my concern for him having an iron  overload syndrome, such as hemochromatosis, dissipated after his hemochromatosis testing last year came back negative.  Furthermore, there has been a consistent downwards trend of his ferritin level over this past year to where it is now normal.  From a hematologic standpoint, I do not see anything at all which has me concerned that something more ominous is present.  Once again, I do feel comfortable turning this gentleman's care back over to his primary care office.  I would not have a problem seeing this patient in the future if new hematologic or oncologic issues develop that require repeat clinical assessment.  The patient and his wife understand all the plans discussed today and are in agreement with them.  Asal Teas DELENA Kerns, MD

## 2023-11-13 ENCOUNTER — Inpatient Hospital Stay: Attending: Oncology | Admitting: Oncology

## 2023-11-13 ENCOUNTER — Inpatient Hospital Stay

## 2023-11-13 ENCOUNTER — Other Ambulatory Visit: Payer: Self-pay

## 2023-11-13 VITALS — BP 135/85 | HR 80 | Temp 98.6°F | Resp 18 | Ht 64.0 in | Wt 291.8 lb

## 2023-11-13 DIAGNOSIS — R7989 Other specified abnormal findings of blood chemistry: Secondary | ICD-10-CM

## 2023-11-13 DIAGNOSIS — I2510 Atherosclerotic heart disease of native coronary artery without angina pectoris: Secondary | ICD-10-CM | POA: Diagnosis not present

## 2023-11-13 DIAGNOSIS — Z79899 Other long term (current) drug therapy: Secondary | ICD-10-CM | POA: Diagnosis not present

## 2023-11-13 DIAGNOSIS — C61 Malignant neoplasm of prostate: Secondary | ICD-10-CM | POA: Insufficient documentation

## 2023-11-13 LAB — CBC WITH DIFFERENTIAL (CANCER CENTER ONLY)
Abs Immature Granulocytes: 0.01 K/uL (ref 0.00–0.07)
Basophils Absolute: 0 K/uL (ref 0.0–0.1)
Basophils Relative: 1 %
Eosinophils Absolute: 0.2 K/uL (ref 0.0–0.5)
Eosinophils Relative: 3 %
HCT: 42.3 % (ref 39.0–52.0)
Hemoglobin: 14.4 g/dL (ref 13.0–17.0)
Immature Granulocytes: 0 %
Lymphocytes Relative: 39 %
Lymphs Abs: 3.1 K/uL (ref 0.7–4.0)
MCH: 31.4 pg (ref 26.0–34.0)
MCHC: 34 g/dL (ref 30.0–36.0)
MCV: 92.4 fL (ref 80.0–100.0)
Monocytes Absolute: 0.4 K/uL (ref 0.1–1.0)
Monocytes Relative: 5 %
Neutro Abs: 4.1 K/uL (ref 1.7–7.7)
Neutrophils Relative %: 52 %
Platelet Count: 152 K/uL (ref 150–400)
RBC: 4.58 MIL/uL (ref 4.22–5.81)
RDW: 13.4 % (ref 11.5–15.5)
WBC Count: 7.8 K/uL (ref 4.0–10.5)
nRBC: 0 % (ref 0.0–0.2)

## 2023-11-13 LAB — CMP (CANCER CENTER ONLY)
ALT: 25 U/L (ref 0–44)
AST: 22 U/L (ref 15–41)
Albumin: 3.8 g/dL (ref 3.5–5.0)
Alkaline Phosphatase: 104 U/L (ref 38–126)
Anion gap: 11 (ref 5–15)
BUN: 12 mg/dL (ref 8–23)
CO2: 24 mmol/L (ref 22–32)
Calcium: 8.9 mg/dL (ref 8.9–10.3)
Chloride: 107 mmol/L (ref 98–111)
Creatinine: 0.84 mg/dL (ref 0.61–1.24)
GFR, Estimated: >60 mL/min (ref >60)
Glucose, Bld: 120 mg/dL — ABNORMAL HIGH (ref 70–99)
Potassium: 3.6 mmol/L (ref 3.5–5.1)
Sodium: 142 mmol/L (ref 135–145)
Total Bilirubin: 0.6 mg/dL (ref 0.0–1.2)
Total Protein: 6.6 g/dL (ref 6.5–8.1)

## 2023-11-13 LAB — IRON AND TIBC
Iron: 79 ug/dL (ref 45–182)
Saturation Ratios: 21 % (ref 17.9–39.5)
TIBC: 384 ug/dL (ref 250–450)
UIBC: 305 ug/dL

## 2023-11-13 LAB — FERRITIN: Ferritin: 225 ng/mL (ref 24–336)

## 2023-11-14 ENCOUNTER — Telehealth: Payer: Self-pay

## 2023-11-14 NOTE — Telephone Encounter (Signed)
 Dr Ezzard: let pt (and wife) know that his ferritin has completely normalized....thx

## 2023-11-16 ENCOUNTER — Encounter: Payer: Self-pay | Admitting: Cardiology

## 2023-11-16 ENCOUNTER — Ambulatory Visit: Attending: Cardiology | Admitting: Cardiology

## 2023-11-16 VITALS — BP 120/78 | HR 79 | Ht 64.0 in | Wt 293.2 lb

## 2023-11-16 DIAGNOSIS — I2510 Atherosclerotic heart disease of native coronary artery without angina pectoris: Secondary | ICD-10-CM

## 2023-11-16 DIAGNOSIS — I10 Essential (primary) hypertension: Secondary | ICD-10-CM | POA: Diagnosis not present

## 2023-11-16 DIAGNOSIS — E782 Mixed hyperlipidemia: Secondary | ICD-10-CM

## 2023-11-16 NOTE — Patient Instructions (Signed)

## 2023-11-16 NOTE — Progress Notes (Signed)
 Cardiology Office Note:    Date:  11/20/2023   ID:  Randall Rojas, DOB April 11, 1960, MRN [id number]  PCP:  Nicholaus Credit, PA-C  Cardiologist:  Dub Huntsman, DO  Electrophysiologist:  None   Referring MD: Nicholaus Credit, PA-C    I am ok  History of Present Illness:    Randall Rojas is a 63 y.o. male with a hx of  coronary artery disease, TIA , hypertension, hyperlipidemia, chronic diastolic heart failure with his recent EF in May 2022 55-60%, morbid obesity, sever OSA on cpap.   Here today for a follow up visit. He has no specific cardiovascular complaints but has had issues with is seizures and working it out with the neurology team.     Past Medical History:  Diagnosis Date   Acute laryngopharyngitis 11/11/2020   Acute URI 12/12/2019   Anxiety 06/17/2020   Arthralgia 06/17/2020   Asthma    Bilateral leg edema 06/23/2020   Chest pain 04/15/2015   Chronic diastolic heart failure (HCC) 08/05/2020   Chronic heart failure with preserved ejection fraction (HCC) 07/11/2020   Coronary artery calcification seen on CT scan 04/15/2015   Coronary artery disease    Coronary artery disease involving native coronary artery of native heart without angina pectoris 07/11/2020   Coronary artery disease of native artery of native heart with stable angina pectoris (HCC) 06/23/2020   Essential hypertension 04/15/2015   Exertional dyspnea 05/26/2020   Frequent headaches 07/20/2020   GERD (gastroesophageal reflux disease) 05/27/2019   History of CVA in adulthood 08/05/2020   HLD (hyperlipidemia) 07/11/2020   Hyperglycemia 06/06/2015   Hyperlipidemia 04/15/2015   Hypertension 08/05/2020   Hypokalemia 07/20/2020   Left sided numbness 07/25/2020   Leukocytosis 06/06/2015   Malaise 06/17/2020   Mixed hyperlipidemia 04/15/2015   Morbid obesity (HCC) 04/15/2015   Need for prophylactic vaccination against Streptococcus pneumoniae (pneumococcus) 12/03/2019   Need for prophylactic vaccination and inoculation against influenza  12/03/2019   Need for tetanus booster 06/17/2020   Nonepileptic episode (HCC) 10/20/2020   Obesity, Class III, BMI 40-49.9 (morbid obesity) 04/15/2015   Other sleep apnea 07/20/2020   Palpitations 08/05/2020   Pneumonia due to COVID-19 virus 04/06/2019   Prediabetes 06/23/2020   Prostate cancer screening 06/02/2019   Rectal bleeding 11/01/2020   Seizure-like activity (HCC) 05/26/2020   Shortness of breath 06/23/2020   TIA (transient ischemic attack)    Upper extremity weakness 05/26/2020   Weakness of left lower extremity 05/26/2020    Past Surgical History:  Procedure Laterality Date   CARDIAC CATHETERIZATION N/A 04/22/2015   Procedure: Left Heart Cath and Coronary Angiography;  Surgeon: Peter M Swaziland, MD;  Location: [facility] INVASIVE CV LAB;  Service: Cardiovascular;  Laterality: N/A;   GALLBLADDER SURGERY     LEFT HEART CATH AND CORONARY ANGIOGRAPHY N/A 10/28/2020   Procedure: LEFT HEART CATH AND CORONARY ANGIOGRAPHY;  Surgeon: Burnard Debby LABOR, MD;  Location: [facility] INVASIVE CV LAB;  Service: Cardiovascular;  Laterality: N/A;   SINUS SURGERY WITH INSTATRAK      Current Medications: Current Meds  Medication Sig   Accu-Chek Softclix Lancets lancets Use up to 4 times daily as directed.   acetaminophen  (TYLENOL ) 500 MG tablet Take by mouth.   amitriptyline  (ELAVIL ) 50 MG tablet TAKE ONE TABLET BY MOUTH AT BEDTIME   baclofen (LIORESAL) 10 MG tablet Take 10-20 tablets by mouth every 6 (six) hours as needed (pain).   blood glucose meter kit and supplies KIT Dispense based on patient and insurance  preference. Use up to four times daily as directed.   bumetanide  (BUMEX ) 2 MG tablet Take 1 tablet (2 mg total) by mouth 2 (two) times daily.   busPIRone (BUSPAR) 30 MG tablet Take 30 mg by mouth 2 (two) times daily.   finasteride (PROSCAR) 5 MG tablet Take 5 mg by mouth daily.   GOODSENSE ASPIRIN  81 MG chewable tablet Chew 81 mg by mouth daily.   ibuprofen (ADVIL) 800 MG tablet Take 800 mg by mouth 3 (three) times daily  as needed.   ipratropium-albuterol  (DUONEB) 0.5-2.5 (3) MG/3ML SOLN Inhale into the lungs.   levETIRAcetam  (KEPPRA ) 750 MG tablet Take 1,500 mg by mouth 2 (two) times daily.   meclizine (ANTIVERT) 25 MG tablet Take 25 mg by mouth 3 (three) times daily as needed.   Misc Natural Products (URINOZINC PLUS PO) Take by mouth daily.   modafinil (PROVIGIL) 200 MG tablet Take 200 mg by mouth 2 (two) times daily.   montelukast (SINGULAIR) 10 MG tablet Take 10 mg by mouth at bedtime.   nitroGLYCERIN  (NITROSTAT ) 0.4 MG SL tablet Place 1 tablet (0.4 mg total) under the tongue every 5 (five) minutes as needed for chest pain. Up to three times.   omeprazole  (PRILOSEC) 40 MG capsule Take 1 capsule by mouth once daily.   OXYGEN  Inhale 2 L into the lungs daily as needed.   potassium chloride  SA (KLOR-CON  M) 20 MEQ tablet Take 1 tablet by mouth twice daily.   ranolazine  (RANEXA ) 500 MG 12 hr tablet Take 1 tablet (500 mg total) by mouth 2 (two) times daily.   rosuvastatin  (CRESTOR ) 10 MG tablet Take 1 tablet (10 mg total) by mouth daily.   Semaglutide -Weight Management (WEGOVY ) 2.4 MG/0.75ML SOAJ Inject 2.4 mg into the skin every 7 (seven) days.   sertraline (ZOLOFT) 100 MG tablet Take 200 mg by mouth daily.   tadalafil (CIALIS) 20 MG tablet SMARTSIG:1 Tablet(s) By Mouth   traZODone (DESYREL) 50 MG tablet Take 100 mg by mouth at bedtime as needed.   TRELEGY ELLIPTA 100-62.5-25 MCG/ACT AEPB Inhale 1 puff into the lungs daily.   vitamin B-12 (CYANOCOBALAMIN ) 1000 MCG tablet Take 1,000 mcg by mouth daily.   [DISCONTINUED] ketoconazole  (NIZORAL ) 2 % shampoo Shampoo twice a week as directed.   [DISCONTINUED] promethazine  (PHENERGAN ) 25 MG tablet Take 1 tablet by mouth every 6 hours as needed for nausea or vomiting.   [DISCONTINUED] triamcinolone  cream (KENALOG ) 0.1 % Apply to the affected area(s) twice a day as directed   [DISCONTINUED] VENTOLIN  HFA 108 (90 Base) MCG/ACT inhaler INHALE TWO PUFFS INTO THE LUNGS EVERY 6  HOURS AS NEEDED FOR WHEEZING OR SHORTNESS OF BREATH   [DISCONTINUED] Vitamin D , Ergocalciferol , (DRISDOL ) 1.25 MG (50000 UNIT) CAPS capsule Take 1 capsule by mouth once every 7 days as directed     Allergies:   Ubrogepant, Cephalexin, and Isosorbide  nitrate   Social History   Socioeconomic History   Marital status: Married    Spouse name: [name]   Number of children: 3   Years of education: 12   Highest education level: GED or equivalent  Occupational History   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   Occupation: DISABLED  Tobacco Use   Smoking status: Former    Current packs/day: 0.00    Types: Cigarettes    Quit date: 04/13/1989    Years since quitting: 34.6   Smokeless tobacco: Never  Vaping Use   Vaping status: Never Used  Substance and Sexual Activity   Alcohol use: Never  Drug use: Never   Sexual activity: Not Currently  Other Topics Concern   Not on file  Social History Narrative   Epworth Sleepiness Scale = 7 (as of 1/18/207)   Social Drivers of Health   Financial Resource Strain: Low Risk  (09/20/2023)   Overall Financial Resource Strain (CARDIA)    Difficulty of Paying Living Expenses: Not very hard  Food Insecurity: Food Insecurity Present (09/20/2023)   Hunger Vital Sign    Worried About Running Out of Food in the Last Year: Sometimes true    Ran Out of Food in the Last Year: Sometimes true  Transportation Needs: No Transportation Needs (09/20/2023)   PRAPARE - Administrator, Civil Service (Medical): No    Lack of Transportation (Non-Medical): No  Physical Activity: Inactive (09/20/2023)   Exercise Vital Sign    Days of Exercise per Week: 0 days    Minutes of Exercise per Session: Not on file  Stress: No Stress Concern Present (09/20/2023)   [employer] of Occupational Health - Occupational Stress Questionnaire    Feeling of Stress: Only a little  Social Connections: Socially Integrated (09/20/2023)   Social Connection and Isolation Panel     Frequency of Communication with Friends and Family: More than three times a week    Frequency of Social Gatherings with Friends and Family: Three times a week    Attends Religious Services: More than 4 times per year    Active Member of Clubs or Organizations: Yes    Attends Engineer, structural: More than 4 times per year    Marital Status: Married     Family History: The patient's family history includes Breast cancer in his cousin and paternal aunt; Cancer in his paternal uncle; Heart attack in his father and sister; Heart disease in his sister; Hypertension in his mother, sister, and sister; Prostate cancer in his brother, father, and paternal uncle; Rheum arthritis in his mother.  ROS:   Review of Systems  Constitution: Negative for decreased appetite, fever and weight gain.  HENT: Negative for congestion, ear discharge, hoarse voice and sore throat.   Eyes: Negative for discharge, redness, vision loss in right eye and visual halos.  Cardiovascular: Negative for chest pain, dyspnea on exertion, leg swelling, orthopnea and palpitations.  Respiratory: Negative for cough, hemoptysis, shortness of breath and snoring.   Endocrine: Negative for heat intolerance and polyphagia.  Hematologic/Lymphatic: Negative for bleeding problem. Does not bruise/bleed easily.  Skin: Negative for flushing, nail changes, rash and suspicious lesions.  Musculoskeletal: Negative for arthritis, joint pain, muscle cramps, myalgias, neck pain and stiffness.  Gastrointestinal: Negative for abdominal pain, bowel incontinence, diarrhea and excessive appetite.  Genitourinary: Negative for decreased libido, genital sores and incomplete emptying.  Neurological: Negative for brief paralysis, focal weakness, headaches and loss of balance.  Psychiatric/Behavioral: Negative for altered mental status, depression and suicidal ideas.  Allergic/Immunologic: Negative for HIV exposure and persistent infections.     EKGs/Labs/Other Studies Reviewed:    The following studies were reviewed today:   EKG:  The ekg ordered today demonstrates sinus rhythm HR 76 bpm.  Left heart catheterization October 28, 2020 1st Diag lesion is 40% stenosed.   Mid LAD lesion is 10% stenosed.   The left ventricular systolic function is normal.   LV end diastolic pressure is mildly elevated.   The left ventricular ejection fraction is 50-55% by visual estimate.  Mild nonobstructive CAD with 40% proximal stenosis in the first diagonal branch of the  LAD with mild smooth luminal 10% mid LAD narrowing; normal left circumflex coronary artery; normal dominant RCA.   Low normal LV function with EF estimated 50 to 55% without focal segmental wall motion abnormality.  LVEDP 21 mm.   RECOMMENDATION: Medical therapy for mild nonobstructive CAD.  Aggressive lipid-lowering therapy with target LDL less than 70.    ZIO monitor Aug 13, 2020 Patch Wear Time:  3 days and 10 hours starting Aug 05, 2020 Indication: Palpitations   Patient had a min HR of 51 bpm, max HR of 120 bpm, and avg HR of 83 bpm.    Predominant underlying rhythm was Sinus Rhythm.   Premature ventricular complexes were rare.   Premature atrial complex were rare.   No ventricular tachycardia, no pauses, no supraventricular tachycardia no atrial fibrillation noted.   Symptoms associated with sinus rhythm.   Conclusion: Normal/unremarkable study with no evidence of significant arrhythmia  Recent Labs: 09/26/2023: TSH 2.040 11/13/2023: ALT 25; BUN 12; Creatinine 0.84; Hemoglobin 14.4; Platelet Count 152; Potassium 3.6; Sodium 142  Recent Lipid Panel    Component Value Date/Time   CHOL 122 09/26/2023 0939   TRIG 164 (H) 09/26/2023 0939   HDL 35 (L) 09/26/2023 0939   CHOLHDL 3.5 09/26/2023 0939   CHOLHDL 6.2 04/07/2019 0334   VLDL 16 04/07/2019 0334   LDLCALC 59 09/26/2023 0939    Physical Exam:    VS:  BP 120/78 (BP Location: Right Arm, Patient  Position: Sitting, Cuff Size: Large)   Pulse 79   Ht 5' 4 (1.626 m)   Wt 293 lb 3.2 oz (133 kg)   SpO2 93%   BMI 50.33 kg/m     Wt Readings from Last 3 Encounters:  11/20/23 290 lb (131.5 kg)  11/16/23 293 lb 3.2 oz (133 kg)  11/13/23 291 lb 12.8 oz (132.4 kg)     GEN: Well nourished, well developed in no acute distress HEENT: Normal NECK: No JVD; No carotid bruits LYMPHATICS: No lymphadenopathy CARDIAC: S1S2 noted,RRR, no murmurs, rubs, gallops RESPIRATORY:  Clear to auscultation without rales, wheezing or rhonchi  ABDOMEN: Soft, non-tender, non-distended, +bowel sounds, no guarding. EXTREMITIES: No edema, No cyanosis, no clubbing MUSCULOSKELETAL:  No deformity  SKIN: Warm and dry NEUROLOGIC:  Alert and oriented x 3, non-focal PSYCHIATRIC:  Normal affect, good insight  ASSESSMENT:    1. Essential hypertension   2. Coronary artery disease involving native coronary artery of native heart without angina pectoris   3. Primary hypertension   4. Mixed hyperlipidemia       PLAN:    Coronary Artery Disease,  Hypertension  Hyperlipidemia  chronic diastolic Heart Failure Obstructive Sleep Apnea:  CAD - no anginal symptoms. Continue current medication regimen.  Hypertriglyceridemia - Triglyceride levels slightly elevated. - Discuss dietary modifications to reduce triglyceride levels, including reducing bread and meat intake. - Emphasize stress reduction and maintaining a healthy diet. - Repeat triglyceride levels at next visit.  Prediabetes - Hemoglobin A1c improved to 5.5, indicating good control. - Continue dietary management and lifestyle changes.  Medication Adjustments/Labs and Tests Ordered: Current medicines are reviewed at length with the patient today.  Concerns regarding medicines are outlined above.  Orders Placed This Encounter  Procedures   EKG 12-Lead   No orders of the defined types were placed in this encounter.   Patient Instructions  Medication  Instructions:  Your physician recommends that you continue on your current medications as directed. Please refer to the Current Medication list given to you today.  *  If you need a refill on your cardiac medications before your next appointment, please call your pharmacy*   Follow-Up: At [facility], you and your health needs are our priority.  As part of our continuing mission to provide you with exceptional heart care, our providers are all part of one team.  This team includes your primary Cardiologist (physician) and Advanced Practice Providers or APPs (Physician Assistants and Nurse Practitioners) who all work together to provide you with the care you need, when you need it.  Your next appointment:   9 month(s)  Provider:   Tesha Archambeau, DO          Adopting a Healthy Lifestyle.  Know what a healthy weight is for you (roughly BMI <25) and aim to maintain this   Aim for 7+ servings of fruits and vegetables daily   65-80+ fluid ounces of water or unsweet tea for healthy kidneys   Limit to max 1 drink of alcohol per day; avoid smoking/tobacco   Limit animal fats in diet for cholesterol and heart health - choose grass fed whenever available   Avoid highly processed foods, and foods high in saturated/trans fats   Aim for low stress - take time to unwind and care for your mental health   Aim for 150 min of moderate intensity exercise weekly for heart health, and weights twice weekly for bone health   Aim for 7-9 hours of sleep daily   When it comes to diets, agreement about the perfect plan isnt easy to find, even among the experts. Experts at the [facility] [employer] developed an idea known as the Healthy Eating Plate. Just imagine a plate divided into logical, healthy portions.   The emphasis is on diet quality:   Load up on vegetables and fruits - one-half of your plate: Aim for color and variety, and remember that potatoes dont count.   Go for whole  grains - one-quarter of your plate: Whole wheat, barley, wheat berries, quinoa, oats, brown rice, and foods made with them. If you want pasta, go with whole wheat pasta.   Protein power - one-quarter of your plate: Fish, chicken, beans, and nuts are all healthy, versatile protein sources. Limit red meat.   The diet, however, does go beyond the plate, offering a few other suggestions.   Use healthy plant oils, such as olive, canola, soy, corn, sunflower and peanut. Check the labels, and avoid partially hydrogenated oil, which have unhealthy trans fats.   If youre thirsty, drink water. Coffee and tea are good in moderation, but skip sugary drinks and limit milk and dairy products to one or two daily servings.   The type of carbohydrate in the diet is more important than the amount. Some sources of carbohydrates, such as vegetables, fruits, whole grains, and beans-are healthier than others.   Finally, stay active  Signed, Jamine Highfill, DO  11/20/2023 3:30 PM    [facility]

## 2023-11-19 ENCOUNTER — Other Ambulatory Visit: Payer: Self-pay | Admitting: Physician Assistant

## 2023-11-19 ENCOUNTER — Other Ambulatory Visit: Payer: Self-pay | Admitting: Family Medicine

## 2023-11-19 DIAGNOSIS — R110 Nausea: Secondary | ICD-10-CM

## 2023-11-19 NOTE — Progress Notes (Signed)
 @Patient  ID: Randall Rojas, male    DOB: [birth year], 63 y.o.   MRN: [id number]  No chief complaint on file.   Referring provider: Tobb, Kardie, DO  HPI: 63 year old male. PMH significant for HTN, non-obstructive CAD, CHF, asthma, sleep apnea, GERD, prostate cancer, insomnia, hx CVA, hyperlipidemia.   11/20/2023 Discussed the use of AI scribe software for clinical note transcription with the patient, who gave verbal consent to proceed. History of Present Illness Randall Rojas is a 63 year old male with sleep apnea who presents for a sleep consult.  Sleep study on 03/25/2021 showed moderate-severe OSA, AHI 29.2/hour. He has been experiencing issues with his current CPAP machine, a DreamStation, which only allows him to sleep for four hours before it becomes ineffective. Previously, he used a different machine from [facility], which was effective, but since switching to the new machine, his sleep quality has deteriorated. His oxygen  levels drop at night, requiring supplemental oxygen  at 1 liter per minute.  He has experienced significant weight loss, approximately 30 to 40 pounds, since 2022, which he attributes to being on Wegovy . His weight has plateaued around 290 pounds, fluctuating slightly. He is on the highest dose of Wegovy , 2.4 mg.  He has a history of asthma and uses albuterol  as needed, particularly during the fall and spring when his allergies worsen. He also takes montelukast regularly. His asthma symptoms are more pronounced during high pollen seasons, requiring the use of a nebulizer and albuterol  inhaler.  He was previously on Trelegy for shortness of breath, particularly when walking or climbing stairs, but discontinued it due to adverse effects from dry powder inhaler.   He reports minimal daytime sleepiness with Epworth score of six. He does snore when not using the CPAP. He experiences shortness of breath with exertion and during high pollen seasons.   Allergies   Allergen Reactions   Ubrogepant Other (See Comments) and Swelling   Cephalexin Nausea And Vomiting, Rash and Other (See Comments)   Isosorbide  Nitrate Other (See Comments)    Blood pressure and heart rate decrease    Immunization History  Administered Date(s) Administered   Fluad Trivalent(High Dose 65+) 12/07/2022   Influenza Inj Mdck Quad Pf 12/03/2019, 12/15/2020, 12/26/2021   Influenza-Unspecified 01/26/2015, 12/03/2019   PNEUMOCOCCAL CONJUGATE-20 12/26/2021   Pneumococcal Polysaccharide-23 12/03/2019   Tdap 06/17/2020    Past Medical History:  Diagnosis Date   Acute laryngopharyngitis 11/11/2020   Acute URI 12/12/2019   Anxiety 06/17/2020   Arthralgia 06/17/2020   Asthma    Bilateral leg edema 06/23/2020   Chest pain 04/15/2015   Chronic diastolic heart failure (HCC) 08/05/2020   Chronic heart failure with preserved ejection fraction (HCC) 07/11/2020   Coronary artery calcification seen on CT scan 04/15/2015   Coronary artery disease    Coronary artery disease involving native coronary artery of native heart without angina pectoris 07/11/2020   Coronary artery disease of native artery of native heart with stable angina pectoris (HCC) 06/23/2020   Essential hypertension 04/15/2015   Exertional dyspnea 05/26/2020   Frequent headaches 07/20/2020   GERD (gastroesophageal reflux disease) 05/27/2019   History of CVA in adulthood 08/05/2020   HLD (hyperlipidemia) 07/11/2020   Hyperglycemia 06/06/2015   Hyperlipidemia 04/15/2015   Hypertension 08/05/2020   Hypokalemia 07/20/2020   Left sided numbness 07/25/2020   Leukocytosis 06/06/2015   Malaise 06/17/2020   Mixed hyperlipidemia 04/15/2015   Morbid obesity (HCC) 04/15/2015   Need for prophylactic vaccination against Streptococcus pneumoniae (pneumococcus)  12/03/2019   Need for prophylactic vaccination and inoculation against influenza 12/03/2019   Need for tetanus booster 06/17/2020   Nonepileptic episode (HCC) 10/20/2020   Obesity, Class III, BMI  40-49.9 (morbid obesity) 04/15/2015   Other sleep apnea 07/20/2020   Palpitations 08/05/2020   Pneumonia due to COVID-19 virus 04/06/2019   Prediabetes 06/23/2020   Prostate cancer screening 06/02/2019   Rectal bleeding 11/01/2020   Seizure-like activity (HCC) 05/26/2020   Shortness of breath 06/23/2020   TIA (transient ischemic attack)    Upper extremity weakness 05/26/2020   Weakness of left lower extremity 05/26/2020    Tobacco History: Social History   Tobacco Use  Smoking Status Former   Current packs/day: 0.00   Types: Cigarettes   Quit date: 04/13/1989   Years since quitting: 34.6  Smokeless Tobacco Never   Counseling given: Not Answered   Outpatient Medications Prior to Visit  Medication Sig Dispense Refill   Accu-Chek Softclix Lancets lancets Use up to 4 times daily as directed. 100 each 0   acetaminophen  (TYLENOL ) 500 MG tablet Take by mouth.     amitriptyline  (ELAVIL ) 50 MG tablet TAKE ONE TABLET BY MOUTH AT BEDTIME 90 tablet 1   baclofen (LIORESAL) 10 MG tablet Take 10-20 tablets by mouth every 6 (six) hours as needed (pain).     blood glucose meter kit and supplies KIT Dispense based on patient and insurance preference. Use up to four times daily as directed. 1 each 0   bumetanide  (BUMEX ) 2 MG tablet Take 1 tablet (2 mg total) by mouth 2 (two) times daily. 180 tablet 0   busPIRone (BUSPAR) 30 MG tablet Take 30 mg by mouth 2 (two) times daily.     finasteride (PROSCAR) 5 MG tablet Take 5 mg by mouth daily.     GOODSENSE ASPIRIN  81 MG chewable tablet Chew 81 mg by mouth daily.     ibuprofen (ADVIL) 800 MG tablet Take 800 mg by mouth 3 (three) times daily as needed.     ipratropium-albuterol  (DUONEB) 0.5-2.5 (3) MG/3ML SOLN Inhale into the lungs.     ketoconazole  (NIZORAL ) 2 % shampoo Shampoo twice a week as directed. 120 mL 0   levETIRAcetam  (KEPPRA ) 750 MG tablet Take 1,500 mg by mouth 2 (two) times daily.     meclizine (ANTIVERT) 25 MG tablet Take 25 mg by mouth 3 (three) times  daily as needed.     Misc Natural Products (URINOZINC PLUS PO) Take by mouth daily.     modafinil (PROVIGIL) 200 MG tablet Take 200 mg by mouth 2 (two) times daily.     montelukast (SINGULAIR) 10 MG tablet Take 10 mg by mouth at bedtime.     nitroGLYCERIN  (NITROSTAT ) 0.4 MG SL tablet Place 1 tablet (0.4 mg total) under the tongue every 5 (five) minutes as needed for chest pain. Up to three times. 45 tablet 3   omeprazole  (PRILOSEC) 40 MG capsule Take 1 capsule by mouth once daily. 90 capsule 1   OXYGEN  Inhale 2 L into the lungs daily as needed.     potassium chloride  SA (KLOR-CON  M) 20 MEQ tablet Take 1 tablet by mouth twice daily. 180 tablet 3   promethazine  (PHENERGAN ) 25 MG tablet Take 1 tablet by mouth every 6 hours as needed for nausea or vomiting. 30 tablet 1   ranolazine  (RANEXA ) 500 MG 12 hr tablet Take 1 tablet (500 mg total) by mouth 2 (two) times daily. 180 tablet 0   rosuvastatin  (CRESTOR ) 10 MG tablet  Take 1 tablet (10 mg total) by mouth daily. 90 tablet 0   Semaglutide -Weight Management (WEGOVY ) 2.4 MG/0.75ML SOAJ Inject 2.4 mg into the skin every 7 (seven) days. 3 mL 1   sertraline (ZOLOFT) 100 MG tablet Take 200 mg by mouth daily.     tadalafil (CIALIS) 20 MG tablet SMARTSIG:1 Tablet(s) By Mouth     traZODone (DESYREL) 50 MG tablet Take 100 mg by mouth at bedtime as needed.     TRELEGY ELLIPTA 100-62.5-25 MCG/ACT AEPB Inhale 1 puff into the lungs daily.     triamcinolone  cream (KENALOG ) 0.1 % Apply to the affected area(s) twice a day as directed 30 g 0   VENTOLIN  HFA 108 (90 Base) MCG/ACT inhaler INHALE TWO PUFFS INTO THE LUNGS EVERY 6 HOURS AS NEEDED FOR WHEEZING OR SHORTNESS OF BREATH 18 g 1   vitamin B-12 (CYANOCOBALAMIN ) 1000 MCG tablet Take 1,000 mcg by mouth daily.     Vitamin D , Ergocalciferol , (DRISDOL ) 1.25 MG (50000 UNIT) CAPS capsule Take 1 capsule by mouth once every 7 days as directed 12 capsule 0   No facility-administered medications prior to visit.   Review of  Systems  Review of Systems  Respiratory: Negative.         DOE  Psychiatric/Behavioral:  Positive for sleep disturbance.    Physical Exam  There were no vitals taken for this visit. Physical Exam Constitutional:      Appearance: Normal appearance.  HENT:     Head: Normocephalic and atraumatic.  Cardiovascular:     Rate and Rhythm: Normal rate and regular rhythm.  Pulmonary:     Effort: Pulmonary effort is normal.     Breath sounds: Normal breath sounds. No wheezing, rhonchi or rales.  Musculoskeletal:        General: Normal range of motion.  Skin:    General: Skin is warm and dry.  Neurological:     General: No focal deficit present.     Mental Status: He is alert and oriented to person, place, and time. Mental status is at baseline.  Psychiatric:        Mood and Affect: Mood normal.        Behavior: Behavior normal.        Thought Content: Thought content normal.        Judgment: Judgment normal.     Lab Results:  CBC    Component Value Date/Time   WBC 7.8 11/13/2023 0913   WBC 5.8 04/07/2019 0334   RBC 4.58 11/13/2023 0913   HGB 14.4 11/13/2023 0913   HGB 15.8 09/26/2023 0939   HCT 42.3 11/13/2023 0913   HCT 48.4 09/26/2023 0939   PLT 152 11/13/2023 0913   PLT 150 09/26/2023 0939   MCV 92.4 11/13/2023 0913   MCV 97 09/26/2023 0939   MCH 31.4 11/13/2023 0913   MCHC 34.0 11/13/2023 0913   RDW 13.4 11/13/2023 0913   RDW 13.1 09/26/2023 0939   LYMPHSABS 3.1 11/13/2023 0913   LYMPHSABS 3.7 (H) 09/26/2023 0939   MONOABS 0.4 11/13/2023 0913   EOSABS 0.2 11/13/2023 0913   EOSABS 0.3 09/26/2023 0939   BASOSABS 0.0 11/13/2023 0913   BASOSABS 0.1 09/26/2023 0939    BMET    Component Value Date/Time   NA 142 11/13/2023 0913   NA 140 09/26/2023 0939   K 3.6 11/13/2023 0913   CL 107 11/13/2023 0913   CO2 24 11/13/2023 0913   GLUCOSE 120 (H) 11/13/2023 0913   BUN 12 11/13/2023 0913  BUN 11 09/26/2023 0939   CREATININE 0.84 11/13/2023 0913   CREATININE  0.98 04/14/2015 1051   CALCIUM  8.9 11/13/2023 0913   GFRNONAA >60 11/13/2023 0913   GFRAA 79 12/03/2019 0909    BNP    Component Value Date/Time   BNP 14.8 04/06/2019 0807   BNP 18.4 04/14/2015 1051    ProBNP No results found for: PROBNP  Imaging: No results found.   Assessment & Plan:   1. Severe sleep apnea (Primary) - Split night study; Future  2. Mild intermittent asthma without complication - Pulmonary Function Test; Future  Assessment & Plan Obstructive sleep apnea Obstructive sleep apnea with CPAP intolerance due to issues with the current machine, which cuts off after three hours of use. Current DME is [employer], download is not available for review. Previous CPAP from [facility] was effective. Weight loss since the last sleep study in December 2022 may affect pressure settings. Insurance requires a new sleep study for a new machine. Discussed potential for weight loss to improve sleep apnea and the possibility of switching from Wegovy  to Zepbound  for weight management. Discussed the surgical option of Inspire, but current BMI does not qualify. - Order repeat sleep study to reestablish diagnosis and titrate CPAP settings. - Call [employer] to obtain a download from the current CPAP machine. - Consider switching from Wegovy  to Zepbound  for weight management after confirming insurance coverage. - Discuss the surgical option of Inspire for future consideration if BMI criteria are met.  Asthma, intermittent, with seasonal exacerbations Intermittent asthma with seasonal exacerbations, particularly in fall and spring due to allergies. Currently using albuterol  inhaler and nebulizer as needed. Trelegy was discontinued due to adverse effects from DPI. Montelukast is taken regularly. Discussed the addition of an antihistamine to manage seasonal allergies. Introduced Airsupra , a new inhaler with albuterol  and a steroid, for intermittent symptoms. - Prescribe Airsupra  inhaler for use up to  four times a day as needed for shortness of breath, wheezing, or chest tightness. - Recommend starting an over-the-counter antihistamine like Claritin or Zyrtec in addition to montelukast. - Order a breathing test to have on file.       Almarie LELON Ferrari, NP 11/19/2023

## 2023-11-20 ENCOUNTER — Ambulatory Visit (HOSPITAL_BASED_OUTPATIENT_CLINIC_OR_DEPARTMENT_OTHER): Admitting: Primary Care

## 2023-11-20 ENCOUNTER — Encounter (HOSPITAL_BASED_OUTPATIENT_CLINIC_OR_DEPARTMENT_OTHER): Payer: Self-pay | Admitting: Primary Care

## 2023-11-20 VITALS — BP 131/71 | HR 76 | Ht 64.0 in | Wt 290.0 lb

## 2023-11-20 DIAGNOSIS — J4520 Mild intermittent asthma, uncomplicated: Secondary | ICD-10-CM

## 2023-11-20 DIAGNOSIS — G4730 Sleep apnea, unspecified: Secondary | ICD-10-CM

## 2023-11-20 DIAGNOSIS — G4733 Obstructive sleep apnea (adult) (pediatric): Secondary | ICD-10-CM | POA: Diagnosis not present

## 2023-11-20 DIAGNOSIS — Z87891 Personal history of nicotine dependence: Secondary | ICD-10-CM | POA: Diagnosis not present

## 2023-11-20 MED ORDER — AIRSUPRA 90-80 MCG/ACT IN AERO: 2.0000 | INHALATION_SPRAY | Freq: Four times a day (QID) | RESPIRATORY_TRACT | 1 refills | Status: DC | PRN

## 2023-11-20 NOTE — Progress Notes (Signed)
 Epworth Sleepiness Scale  Use the following scale to choose the most appropriate number for each situation. 0 Would never nod off 1  Slight  chance of nodding off 2 Moderate chance of nodding off 3 High chance of nodding off  Sitting and reading: 1 Watching TV: 0 Sitting, inactive, in a public place (e.g., in a meeting, theater, or dinner event): 0 As a passenger in a car for an hour or more without stopping for a break: 0 Lying down to rest when circumstances permit:3 Sitting and talking to someone: 0 Sitting quietly after a meal without alcohol: 1 In a car, while stopped for a few  minutes in traffic or at a light: 0  TOTOAL: 6

## 2023-11-20 NOTE — Patient Instructions (Addendum)
  VISIT SUMMARY: You came in today for a sleep consultation due to issues with your current CPAP machine and to discuss your asthma management. You have experienced significant weight loss since 2022, which may affect your sleep apnea treatment. We also reviewed your asthma symptoms, particularly during high pollen seasons.  YOUR PLAN: -OBSTRUCTIVE SLEEP APNEA: Obstructive sleep apnea is a condition where your airway becomes blocked during sleep, causing breathing pauses. Your current CPAP machine is not working effectively, so we will order a new sleep study to adjust your treatment. We will also look into switching your weight management medication from Wegovy  to Zepbound , depending on insurance coverage. Additionally, we discussed the surgical option of Inspire for future consideration if you meet the BMI criteria.  -ASTHMA, INTERMITTENT, WITH SEASONAL EXACERBATIONS: Asthma is a condition where your airways become inflamed and narrow, making it hard to breathe. Your asthma worsens during high pollen seasons. We will prescribe a new inhaler called Airsupra  for use up to four times a day as needed. You should also start taking an over-the-counter antihistamine like Claritin or Zyrtec in addition to your regular montelukast. We will order a breathing test to have on file.  INSTRUCTIONS: 1. Complete the new sleep study to adjust your CPAP settings. 2. Please bring CPAP to [employer] to obtain a download from your current CPAP machine and fax to our office  3. Check with your insurance about switching from Wegovy  to Zepbound  for weight management. 4. Start using the Airsupra  inhaler as needed for breakthrough asthma symptoms, up to four times a day. 5. Begin taking an over-the-counter antihistamine like Claritin or Zyrtec daily. 6. Continue Montelukast (singulair) 10mg  at bedtime 7. Complete the ordered breathing test.  Follow-up 3 months with Beth NP at [facility] with 1 hours PFT prior

## 2023-11-21 DIAGNOSIS — G4733 Obstructive sleep apnea (adult) (pediatric): Secondary | ICD-10-CM | POA: Diagnosis not present

## 2023-11-22 ENCOUNTER — Encounter (HOSPITAL_BASED_OUTPATIENT_CLINIC_OR_DEPARTMENT_OTHER): Payer: Self-pay | Admitting: Pharmacist Clinician (PhC)/ Clinical Pharmacy Specialist

## 2023-11-23 ENCOUNTER — Other Ambulatory Visit (HOSPITAL_COMMUNITY): Payer: Self-pay

## 2023-11-23 ENCOUNTER — Telehealth: Payer: Self-pay | Admitting: Pharmacy Technician

## 2023-11-23 MED ORDER — ZEPBOUND 10 MG/0.5ML ~~LOC~~ SOAJ: 10.0000 mg | SUBCUTANEOUS | 0 refills | Status: DC

## 2023-11-23 NOTE — Telephone Encounter (Signed)
 Pharmacy Patient Advocate Encounter  Received notification from [facility] that Prior Authorization for zepbound  has been APPROVED from 11/23/23 to 03/26/24. Ran test claim, Copay is $0.00 one month. This test claim was processed through [facility]- copay amounts may vary at other pharmacies due to pharmacy/plan contracts, or as the patient moves through the different stages of their insurance plan.   PA #/Case ID/Reference #: [id number]

## 2023-11-23 NOTE — Telephone Encounter (Signed)
 Can you please try a PA for Zepbound  with dx of moderate-severe OSA?

## 2023-11-23 NOTE — Telephone Encounter (Signed)
 Pharmacy Patient Advocate Encounter   Received notification from Physician's Office that prior authorization for zepbound  2.5 MG is required/requested.   Insurance verification completed.   The patient is insured through [employer] .   Per test claim: PA required; PA submitted to above mentioned insurance via Latent Key/confirmation #/EOC [id number] Status is pending

## 2023-11-29 ENCOUNTER — Ambulatory Visit (INDEPENDENT_AMBULATORY_CARE_PROVIDER_SITE_OTHER)

## 2023-11-29 DIAGNOSIS — Z23 Encounter for immunization: Secondary | ICD-10-CM

## 2023-11-29 NOTE — Progress Notes (Signed)
 Patient is in office today for a nurse visit for Immunization and Flu Vaccine. Patient Injection was given in the  Right deltoid. Patient tolerated injection well.

## 2023-12-09 DIAGNOSIS — I5032 Chronic diastolic (congestive) heart failure: Secondary | ICD-10-CM | POA: Diagnosis not present

## 2023-12-09 DIAGNOSIS — R0602 Shortness of breath: Secondary | ICD-10-CM | POA: Diagnosis not present

## 2024-01-03 ENCOUNTER — Other Ambulatory Visit: Payer: Self-pay

## 2024-01-03 ENCOUNTER — Encounter: Payer: Self-pay | Admitting: Cardiology

## 2024-01-03 MED ORDER — ROSUVASTATIN CALCIUM 10 MG PO TABS: 10.0000 mg | ORAL_TABLET | Freq: Every day | ORAL | 3 refills | Status: AC

## 2024-01-03 NOTE — Telephone Encounter (Signed)
 RX sent in

## 2024-01-04 ENCOUNTER — Other Ambulatory Visit: Payer: Self-pay | Admitting: Physician Assistant

## 2024-01-04 ENCOUNTER — Encounter: Payer: Self-pay | Admitting: Cardiology

## 2024-01-04 ENCOUNTER — Encounter: Payer: Self-pay | Admitting: Physician Assistant

## 2024-01-04 DIAGNOSIS — K219 Gastro-esophageal reflux disease without esophagitis: Secondary | ICD-10-CM

## 2024-01-04 MED ORDER — OMEPRAZOLE 40 MG PO CPDR: 40.0000 mg | DELAYED_RELEASE_CAPSULE | Freq: Every day | ORAL | 1 refills | Status: DC

## 2024-01-06 ENCOUNTER — Ambulatory Visit (HOSPITAL_BASED_OUTPATIENT_CLINIC_OR_DEPARTMENT_OTHER): Attending: Primary Care | Admitting: Pulmonary Disease

## 2024-01-06 DIAGNOSIS — G4730 Sleep apnea, unspecified: Secondary | ICD-10-CM | POA: Diagnosis present

## 2024-01-06 DIAGNOSIS — G4733 Obstructive sleep apnea (adult) (pediatric): Secondary | ICD-10-CM | POA: Diagnosis not present

## 2024-01-08 ENCOUNTER — Other Ambulatory Visit (HOSPITAL_BASED_OUTPATIENT_CLINIC_OR_DEPARTMENT_OTHER): Payer: Self-pay | Admitting: Cardiology

## 2024-01-08 DIAGNOSIS — R0602 Shortness of breath: Secondary | ICD-10-CM | POA: Diagnosis not present

## 2024-01-08 DIAGNOSIS — M5417 Radiculopathy, lumbosacral region: Secondary | ICD-10-CM | POA: Diagnosis not present

## 2024-01-08 DIAGNOSIS — I5032 Chronic diastolic (congestive) heart failure: Secondary | ICD-10-CM | POA: Diagnosis not present

## 2024-01-10 DIAGNOSIS — G4733 Obstructive sleep apnea (adult) (pediatric): Secondary | ICD-10-CM | POA: Diagnosis not present

## 2024-01-11 NOTE — Procedures (Signed)
 [facility] [address] [place], [place] [address] Tel: [phone]   Fax: [phone]  Split Night Interpretation  Patient Name:  Randall Rojas, Randall Rojas Date:  01/06/2024 Referring Physician:  ALMARIE FERRARI [phone]) %%startinterp%% Indications for Polysomnography The patient is a 63 year old Male who is 5' 4 and weighs 280.0 lbs.  His BMI equals 48.4.  A diagnostic polysomnogram was performed to reassess OSA.  After 134.0 minutes of sleep time the patient exhibited sufficient respiratory events qualifying him for a CPAP trial which was then initiated.    Medications Taken at 2135:  AIRSUPRA   AMITRIPTYLINE   BUSPIRONE  FINASTERIDE  GOODSENSE ASPIRIN   KEPPRA   RANEXA   SERTRALINE  TRAZODONE  URINOZINC PROSTATE  UNKNOWN SEIZURE MED   Polysomnogram Data A full night polysomnogram was performed recording the standard physiologic parameters including EEG, EOG, EMG, EKG, nasal and oral airflow.  Respiratory parameters of chest and abdominal movements are recorded with [place]-Crystal motion transducers.  Oxygen  saturation was recorded by pulse oximetry.    Sleep Architecture The total recording time of the diagnostic portion of the study was 152.0 minutes.  The total sleep time was 134.0 minutes.  During the diagnostic portion of the study, the patient spent 1.9% of total sleep time in Stage N1, 14.6% in Stage N2, 73.1% in Stages N3, and 10.4% in REM.   Sleep latency was 6.0 minutes.  REM latency was 78.0 minutes.  Sleep Efficiency was 88.1%.  Wake after Sleep Onset time was 12.0 minutes.   At 12:42:55 AM the patient was placed on PAP treatment and was titrated at pressures ranging from 7* cm/H20 with supplemental oxygen  at - up to 16* cm/H20 with supplemental oxygen  at -.  The total recording time of the treatment portion of the study was 261.5 minutes.  The total sleep time was 249.5 minutes.  During the treatment portion of the study, the patient  spent 1.6% of total sleep time in Stage N1, 18.4% in Stage N2, 64.3% in Stages N3, and 15.6% in REM.   Sleep latency was 7.5 minutes.  REM latency was 72.0 minutes.  Sleep Efficiency was 95.4%.  Wake after Sleep Onset time was 4.5 minutes.  Respiratory Events During the diagnostic portion of the study, the polysomnogram revealed a presence of 4 obstructive, - central, and - mixed apneas resulting in an Apnea index of 1.8 events per hour.  There were 41 hypopneas (>=3% desaturation and/or arousal) resulting in an Apnea\Hypopnea Index (AHI >=3% desaturation and/or arousal) of 20.1 events per hour.  There were 28 hypopneas (>=4% desaturation) resulting in an Apnea\Hypopnea Index (AHI >=4% desaturation) of 14.3 events per hour.  There were 16 Respiratory Effort Related Arousals resulting in a RERA index of 7.2 events per hour. The Respiratory Disturbance Index is 27.3 events per hour.  The snore index was 333.1 events per hour.  Mean oxygen  saturation was 85.6%.  The lowest oxygen  saturation during sleep was 57.0%.  Time spent <=88% oxygen  saturation was 116.7 minutes (79.4%).  During the treatment portion of the study, the polysomnogram revealed a presence of 4 obstructive, 5 centrals, and - mixed apneas resulting in an Apnea index of 2.2 events per hour.  There were 35 hypopneas (>=3% desaturation and/or arousal) resulting in an Apnea\Hypopnea Index (AHI >=3% desaturation and/or arousal) of 10.6 events per hour.  There were 8 hypopneas (>=4% desaturation) resulting in an Apnea\Hypopnea Index (AHI >=4% desaturation) of 4.1 events per hour.  There were 39 Respiratory Effort Related Arousals resulting  in a RERA index of 9.4 events per hour. The Respiratory Disturbance Index is 20.0 events per hour.  The snore index was 3.6 events per hour.  Mean oxygen  saturation was 90.8%.  The lowest oxygen  saturation during sleep was 68.0%.  Time spent <=88% oxygen  saturation was 8.3 minutes (3.2%).  Limb Activity No  significant PLMs noted during the diagnostic portion or treatment portion of the study  Cardiac Summary During the diagnostic portion of the study, the average pulse rate was 79.2 bpm.  The minimum pulse rate was 60.0 bpm while the maximum pulse rate was 95.0 bpm.  During the treatment portion of the study, the average pulse rate was 75.2 bpm.  The minimum pulse rate was 64.0 bpm while the maximum pulse rate was 90.0 bpm.   Comments: The patient met split night criteria .CPAP was started at a pressure of 7 cm H2O and was increased to a pressure of 16 cm H2O. The final pressure the patient was on was 15 cm H2O.  Due to the patient's thick and long facial hair, high leak was present throughout the titration. The patient tolerated the pressures well. The patient slept in the supine and right positions. Supine REM was achieved at the final pressure  Diagnosis: Moderate OSA corrected by CPAP 15 cm  Recommendations: Initiate CPAP 15 cm with airfit F20 full face mask Alternatively autoCPAP 10-16 cm can be used  Close clinical follow up with compliance monitoring to optimize therapeutic efficiency Weight loss measures encouraged He should be cautioned against driving when sleepy & against medications with sedative side effects   This study was personally reviewed and electronically signed by: JUDE HARDEN GAILS., MD Accredited Board Certified in Sleep Medicine

## 2024-01-17 DIAGNOSIS — G43709 Chronic migraine without aura, not intractable, without status migrainosus: Secondary | ICD-10-CM | POA: Diagnosis not present

## 2024-01-21 DIAGNOSIS — G4733 Obstructive sleep apnea (adult) (pediatric): Secondary | ICD-10-CM | POA: Diagnosis not present

## 2024-01-28 ENCOUNTER — Other Ambulatory Visit: Payer: Self-pay

## 2024-01-28 ENCOUNTER — Other Ambulatory Visit: Payer: Self-pay | Admitting: Physician Assistant

## 2024-01-28 MED ORDER — BENZONATATE 200 MG PO CAPS
200.0000 mg | ORAL_CAPSULE | Freq: Three times a day (TID) | ORAL | 1 refills | Status: DC | PRN
Start: 2024-01-28 — End: 2024-02-19

## 2024-01-28 NOTE — Telephone Encounter (Signed)
 Patient called and wanted TO KNOW IF PROVIDER WOULD SEND IN TESSALON  PEARLS FOR HIS COUGH.  PER PROVIDER WILL SENT RX.

## 2024-01-31 ENCOUNTER — Encounter: Payer: Self-pay | Admitting: Physician Assistant

## 2024-01-31 ENCOUNTER — Ambulatory Visit (INDEPENDENT_AMBULATORY_CARE_PROVIDER_SITE_OTHER): Admitting: Physician Assistant

## 2024-01-31 VITALS — BP 110/68 | HR 76 | Temp 97.8°F | Ht 64.0 in | Wt 278.0 lb

## 2024-01-31 DIAGNOSIS — I25118 Atherosclerotic heart disease of native coronary artery with other forms of angina pectoris: Secondary | ICD-10-CM | POA: Diagnosis not present

## 2024-01-31 DIAGNOSIS — I10 Essential (primary) hypertension: Secondary | ICD-10-CM

## 2024-01-31 DIAGNOSIS — R7989 Other specified abnormal findings of blood chemistry: Secondary | ICD-10-CM | POA: Diagnosis not present

## 2024-01-31 DIAGNOSIS — R569 Unspecified convulsions: Secondary | ICD-10-CM

## 2024-01-31 DIAGNOSIS — R7303 Prediabetes: Secondary | ICD-10-CM

## 2024-01-31 DIAGNOSIS — J410 Simple chronic bronchitis: Secondary | ICD-10-CM | POA: Diagnosis not present

## 2024-01-31 DIAGNOSIS — E559 Vitamin D deficiency, unspecified: Secondary | ICD-10-CM | POA: Diagnosis not present

## 2024-01-31 DIAGNOSIS — K219 Gastro-esophageal reflux disease without esophagitis: Secondary | ICD-10-CM

## 2024-01-31 DIAGNOSIS — E538 Deficiency of other specified B group vitamins: Secondary | ICD-10-CM

## 2024-01-31 DIAGNOSIS — E782 Mixed hyperlipidemia: Secondary | ICD-10-CM | POA: Diagnosis not present

## 2024-01-31 DIAGNOSIS — F332 Major depressive disorder, recurrent severe without psychotic features: Secondary | ICD-10-CM

## 2024-01-31 DIAGNOSIS — C61 Malignant neoplasm of prostate: Secondary | ICD-10-CM | POA: Diagnosis not present

## 2024-01-31 LAB — POCT URINALYSIS DIP (CLINITEK)
Bilirubin, UA: NEGATIVE
Blood, UA: NEGATIVE
Glucose, UA: NEGATIVE mg/dL
Ketones, POC UA: NEGATIVE mg/dL
Leukocytes, UA: NEGATIVE
Nitrite, UA: NEGATIVE
POC PROTEIN,UA: NEGATIVE
Spec Grav, UA: 1.02 (ref 1.010–1.025)
Urobilinogen, UA: NEGATIVE U/dL
pH, UA: 6 (ref 5.0–8.0)

## 2024-01-31 MED ORDER — BREZTRI AEROSPHERE 160-9-4.8 MCG/ACT IN AERO: 2.0000 | INHALATION_SPRAY | Freq: Two times a day (BID) | RESPIRATORY_TRACT | 11 refills | Status: AC

## 2024-01-31 NOTE — Progress Notes (Signed)
 Established Patient Office Visit  Subjective:  Patient ID: Randall Rojas, male    DOB: 30-Apr-1960  Age: 63 y.o. MRN: [id number]  CC:  Chief Complaint  Patient presents with   Medical Management of Chronic Issues    HPI Randall Rojas presents for follow up of chronic medical issues   Mixed hyperlipidemia  Pt presents with hyperlipidemia. Compliance with treatment has been fair; The patient maintains a low cholesterol diet , follows up as directed ,  The patient denies experiencing any hypercholesterolemia related symptoms. -- he also has coronary artery disease - follows with Dr Sheena (cardiology) regularly Currently on crestor  10mg  every day as well as asa 81mg  every day and ranexa  500mg  bid Pt also diagnosed with CHF - he takes bumex  2mg  in addition to other medications Pt denies chest pain or dyspnea today  Pt continues to follow with neurology for history of headaches , history of TIA  and seizure like activity- he does follow up with them regularly -- provider amy Livingston NP He is taking keppra  and now also vimpat --- they are currently adjusting dosing and he recently had appt   Pt  has anxiety/depression and takes zoloft 100 , trazadone 50mg , elavil  50mg  and buspar 30.  He does see a veterinary surgeon - states symptoms are stable at this time and also follows with psychiatry -   Pt with GERD - pt using omeprazole  40mg  as directed  Pt with Vit D def - taking weekly supplement - due for labwork  Pt has vit B12 def - taking otc supplement - due for labwork -   Pt has been diagnosed with prostate cancer - he is following with Dr Marda - he is currently on proscar 5mg  qd and cialis 20mg  He also takes urinozinc qd - will have follow up appt in few months - last PSA 10/24 was normal  Pt currently on Zepbound  for weight management with CAD - tolerating medication well  He is at 10 mg weekly dose and has lost 12 pounds since last visit He states he is trying to watch diet but because of other  health issues is hard for him to exercise  Pt with elevated ferritin - has consulted with hematology and stable at this time  Pt with chronic bronchitis and sleep apnea.  He follows with pulmonologist Dr Mardee regularly - He uses duoneb as needed, albuterol  prn, .  He stopped trelegy because he did not like the powder - will start him on Breztri instead -  He does use a CPAP at night with 1L oxygen .  He also takes provigil  Pt with chronic low back pain and knee pain.  He has seen ortho in past for these issues.  Currently uses  baclofen prn  Past Medical History:  Diagnosis Date   Acute laryngopharyngitis 11/11/2020   Acute URI 12/12/2019   Anxiety 06/17/2020   Arthralgia 06/17/2020   Asthma    Bilateral leg edema 06/23/2020   Chest pain 04/15/2015   Chronic diastolic heart failure (HCC) 08/05/2020   Chronic heart failure with preserved ejection fraction (HCC) 07/11/2020   Coronary artery calcification seen on CT scan 04/15/2015   Coronary artery disease    Coronary artery disease involving native coronary artery of native heart without angina pectoris 07/11/2020   Coronary artery disease of native artery of native heart with stable angina pectoris 06/23/2020   Essential hypertension 04/15/2015   Exertional dyspnea 05/26/2020   Frequent headaches 07/20/2020   GERD (  gastroesophageal reflux disease) 05/27/2019   History of CVA in adulthood 08/05/2020   HLD (hyperlipidemia) 07/11/2020   Hyperglycemia 06/06/2015   Hyperlipidemia 04/15/2015   Hypertension 08/05/2020   Hypokalemia 07/20/2020   Left sided numbness 07/25/2020   Leukocytosis 06/06/2015   Malaise 06/17/2020   Mixed hyperlipidemia 04/15/2015   Morbid obesity (HCC) 04/15/2015   Need for prophylactic vaccination against Streptococcus pneumoniae (pneumococcus) 12/03/2019   Need for prophylactic vaccination and inoculation against influenza 12/03/2019   Need for tetanus booster 06/17/2020   Nonepileptic episode (HCC) 10/20/2020   Obesity, Class III, BMI  40-49.9 (morbid obesity) (HCC) 04/15/2015   Other sleep apnea 07/20/2020   Palpitations 08/05/2020   Pneumonia due to COVID-19 virus 04/06/2019   Prediabetes 06/23/2020   Prostate cancer screening 06/02/2019   Rectal bleeding 11/01/2020   Seizure-like activity (HCC) 05/26/2020   Shortness of breath 06/23/2020   TIA (transient ischemic attack)    Upper extremity weakness 05/26/2020   Weakness of left lower extremity 05/26/2020    Past Surgical History:  Procedure Laterality Date   CARDIAC CATHETERIZATION N/A 04/22/2015   Procedure: Left Heart Cath and Coronary Angiography;  Surgeon: Peter M Jordan, MD;  Location: [facility] INVASIVE CV LAB;  Service: Cardiovascular;  Laterality: N/A;   GALLBLADDER SURGERY     LEFT HEART CATH AND CORONARY ANGIOGRAPHY N/A 10/28/2020   Procedure: LEFT HEART CATH AND CORONARY ANGIOGRAPHY;  Surgeon: Burnard Debby LABOR, MD;  Location: [facility] INVASIVE CV LAB;  Service: Cardiovascular;  Laterality: N/A;   SINUS SURGERY WITH INSTATRAK      Family History  Problem Relation Age of Onset   Hypertension Mother    Rheum arthritis Mother    Heart attack Father    Prostate cancer Father        METS TO BRAIN; H/O SKIN CANCER   Hypertension Sister    Heart disease Sister    Heart attack Sister    Hypertension Sister    Prostate cancer Brother        METS TO BONE AND LIVER   Breast cancer Paternal Aunt    Prostate cancer Paternal Uncle        METS TO LYMPH NODES   Cancer Paternal Uncle        UNKNOWN PRIMARY ORAL MALIGNANCY   Breast cancer Cousin     Social History   Socioeconomic History   Marital status: Married    Spouse name: [name]   Number of children: 3   Years of education: 12   Highest education level: GED or equivalent  Occupational History   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   Occupation: DISABLED  Tobacco Use   Smoking status: Former    Current packs/day: 0.00    Types: Cigarettes    Quit date: 04/13/1989    Years since quitting: 34.8   Smokeless tobacco: Never  Vaping  Use   Vaping status: Never Used  Substance and Sexual Activity   Alcohol use: Never   Drug use: Never   Sexual activity: Not Currently  Other Topics Concern   Not on file  Social History Narrative   Epworth Sleepiness Scale = 7 (as of 1/18/207)   Social Drivers of Health   Financial Resource Strain: Low Risk  (09/20/2023)   Overall Financial Resource Strain (CARDIA)    Difficulty of Paying Living Expenses: Not very hard  Food Insecurity: Food Insecurity Present (09/20/2023)   Hunger Vital Sign    Worried About Running Out of Food in the Last Year: Sometimes true  Ran Out of Food in the Last Year: Sometimes true  Transportation Needs: No Transportation Needs (09/20/2023)   PRAPARE - Administrator, Civil Service (Medical): No    Lack of Transportation (Non-Medical): No  Physical Activity: Inactive (09/20/2023)   Exercise Vital Sign    Days of Exercise per Week: 0 days    Minutes of Exercise per Session: Not on file  Stress: No Stress Concern Present (09/20/2023)   [employer] of Occupational Health - Occupational Stress Questionnaire    Feeling of Stress: Only a little  Social Connections: Socially Integrated (09/20/2023)   Social Connection and Isolation Panel    Frequency of Communication with Friends and Family: More than three times a week    Frequency of Social Gatherings with Friends and Family: Three times a week    Attends Religious Services: More than 4 times per year    Active Member of Clubs or Organizations: Yes    Attends Banker Meetings: More than 4 times per year    Marital Status: Married  Catering Manager Violence: Not At Risk (02/08/2023)   Humiliation, Afraid, Rape, and Kick questionnaire    Fear of Current or Ex-Partner: No    Emotionally Abused: No    Physically Abused: No    Sexually Abused: No     Current Outpatient Medications:    Accu-Chek Softclix Lancets lancets, Use up to 4 times daily as directed., Disp: 100 each,  Rfl: 0   acetaminophen  (TYLENOL ) 500 MG tablet, Take by mouth., Disp: , Rfl:    Albuterol -Budesonide (AIRSUPRA ) 90-80 MCG/ACT AERO, Inhale 2 puffs into the lungs every 6 (six) hours as needed., Disp: 10.7 g, Rfl: 1   amitriptyline  (ELAVIL ) 50 MG tablet, TAKE ONE TABLET BY MOUTH AT BEDTIME, Disp: 90 tablet, Rfl: 1   baclofen (LIORESAL) 10 MG tablet, Take 10-20 tablets by mouth every 6 (six) hours as needed (pain)., Disp: , Rfl:    benzonatate  (TESSALON ) 200 MG capsule, Take 1 capsule (200 mg total) by mouth 3 (three) times daily as needed for cough., Disp: 30 capsule, Rfl: 1   blood glucose meter kit and supplies KIT, Dispense based on patient and insurance preference. Use up to four times daily as directed., Disp: 1 each, Rfl: 0   budesonide-glycopyrrolate-formoterol (BREZTRI AEROSPHERE) 160-9-4.8 MCG/ACT AERO inhaler, Inhale 2 puffs into the lungs 2 (two) times daily., Disp: 10.7 g, Rfl: 11   bumetanide  (BUMEX ) 2 MG tablet, Take 1 tablet (2 mg total) by mouth 2 (two) times daily., Disp: 180 tablet, Rfl: 0   busPIRone (BUSPAR) 30 MG tablet, Take 30 mg by mouth 2 (two) times daily., Disp: , Rfl:    finasteride (PROSCAR) 5 MG tablet, Take 5 mg by mouth daily., Disp: , Rfl:    GOODSENSE ASPIRIN  81 MG chewable tablet, Chew 81 mg by mouth daily., Disp: , Rfl:    ibuprofen (ADVIL) 800 MG tablet, Take 800 mg by mouth 3 (three) times daily as needed., Disp: , Rfl:    ipratropium-albuterol  (DUONEB) 0.5-2.5 (3) MG/3ML SOLN, Inhale into the lungs., Disp: , Rfl:    ketoconazole  (NIZORAL ) 2 % shampoo, Apply topically 2 (two) times a week., Disp: 120 mL, Rfl: 1   levETIRAcetam  (KEPPRA ) 750 MG tablet, Take 1,500 mg by mouth 2 (two) times daily., Disp: , Rfl:    meclizine (ANTIVERT) 25 MG tablet, Take 25 mg by mouth 3 (three) times daily as needed., Disp: , Rfl:    Misc Natural Products (URINOZINC PLUS PO),  Take by mouth daily., Disp: , Rfl:    modafinil (PROVIGIL) 200 MG tablet, Take 200 mg by mouth 2 (two) times  daily., Disp: , Rfl:    montelukast (SINGULAIR) 10 MG tablet, Take 10 mg by mouth at bedtime., Disp: , Rfl:    nitroGLYCERIN  (NITROSTAT ) 0.4 MG SL tablet, Place 1 tablet (0.4 mg total) under the tongue every 5 (five) minutes as needed for chest pain. Up to three times., Disp: 45 tablet, Rfl: 3   omeprazole  (PRILOSEC) 40 MG capsule, Take 1 capsule (40 mg total) by mouth daily., Disp: 90 capsule, Rfl: 1   potassium chloride  SA (KLOR-CON  M) 20 MEQ tablet, Take 1 tablet by mouth twice daily., Disp: 180 tablet, Rfl: 3   promethazine  (PHENERGAN ) 25 MG tablet, Take 1 tablet by mouth every 6 hours as needed for nausea or vomiting., Disp: 30 tablet, Rfl: 1   ranolazine  (RANEXA ) 500 MG 12 hr tablet, Take 1 tablet (500 mg total) by mouth 2 (two) times daily. (Patient taking differently: Take 500 mg by mouth daily.), Disp: 180 tablet, Rfl: 0   rosuvastatin  (CRESTOR ) 10 MG tablet, Take 1 tablet (10 mg total) by mouth daily., Disp: 90 tablet, Rfl: 3   sertraline (ZOLOFT) 100 MG tablet, Take 200 mg by mouth daily., Disp: , Rfl:    tadalafil (CIALIS) 20 MG tablet, SMARTSIG:1 Tablet(s) By Mouth, Disp: , Rfl:    tirzepatide  (ZEPBOUND ) 10 MG/0.5ML Pen, INJECT 10 MG INTO THE SKIN ONE TIME PER WEEK, Disp: 2 mL, Rfl: 3   traZODone (DESYREL) 50 MG tablet, Take 100 mg by mouth at bedtime as needed., Disp: , Rfl:    triamcinolone  cream (KENALOG ) 0.1 %, Apply to the affected area(s) twice a day as directed, Disp: 30 g, Rfl: 1   vitamin B-12 (CYANOCOBALAMIN ) 1000 MCG tablet, Take 1,000 mcg by mouth daily., Disp: , Rfl:    Vitamin D , Ergocalciferol , (DRISDOL ) 1.25 MG (50000 UNIT) CAPS capsule, Take 1 capsule by mouth once every 7 days as directed, Disp: 12 capsule, Rfl: 1   Lacosamide 100 MG TABS, Take 1 tablet by mouth once daily at bedtime x1 week, then take 1 tablet by mouth twice a day x1 week, then take 1 tablet by mouth every morning and 2 tablets by mouth at night x1 week, then take 2 tablets by mouth every morning and 2  tablets by mouth at night thereafter, Disp: , Rfl:    OXYGEN , 1 L. With CPAP, Disp: , Rfl:    Allergies  Allergen Reactions   Ubrogepant Other (See Comments) and Swelling   Cephalexin Nausea And Vomiting, Rash and Other (See Comments)   Isosorbide  Nitrate Other (See Comments)    Blood pressure and heart rate decrease   CONSTITUTIONAL: Negative for chills, fatigue, fever, unintentional weight gain and unintentional weight loss.  E/N/T: Negative for ear pain, nasal congestion and sore throat.  CARDIOVASCULAR: Negative for chest pain, dizziness, palpitations and pedal edema.  RESPIRATORY: Negative for recent cough and dyspnea.  GASTROINTESTINAL: Negative for abdominal pain, acid reflux symptoms, constipation, diarrhea, nausea and vomiting.  MSK: Negative for arthralgias and myalgias.  INTEGUMENTARY: Negative for rash.  NEUROLOGICAL: Negative for dizziness and headaches.  PSYCHIATRIC: Negative for sleep disturbance and to question depression screen.  Negative for depression, negative for anhedonia.       Objective:  PHYSICAL EXAM:   VS: BP 110/68 (BP Location: Left Arm, Patient Position: Sitting)   Pulse 76   Temp 97.8 F (36.6 C) (Temporal)   Ht  5' 4 (1.626 m)   Wt 278 lb (126.1 kg)   SpO2 94%   BMI 47.72 kg/m   GEN: Well nourished, well developed, in no acute distress   Cardiac: RRR; no murmurs, rubs, or gallops,no edema -  Respiratory:  normal respiratory rate and pattern with no distress - normal breath sounds with no rales, rhonchi, wheezes or rubs  MS: no deformity or atrophy  Skin: warm and dry, no rash  Neuro:  Alert and Oriented x 3, - CN II-Xii grossly intact Psych: euthymic mood, appropriate affect and demeanor   There are no preventive care reminders to display for this patient.  Lab Results  Component Value Date   TSH 2.040 09/26/2023   Lab Results  Component Value Date   WBC 7.8 11/13/2023   HGB 14.4 11/13/2023   HCT 42.3 11/13/2023   MCV 92.4  11/13/2023   PLT 152 11/13/2023   Lab Results  Component Value Date   NA 142 11/13/2023   K 3.6 11/13/2023   CO2 24 11/13/2023   GLUCOSE 120 (H) 11/13/2023   BUN 12 11/13/2023   CREATININE 0.84 11/13/2023   BILITOT 0.6 11/13/2023   ALKPHOS 104 11/13/2023   AST 22 11/13/2023   ALT 25 11/13/2023   PROT 6.6 11/13/2023   ALBUMIN 3.8 11/13/2023   CALCIUM  8.9 11/13/2023   ANIONGAP 11 11/13/2023   EGFR 78 09/26/2023   Lab Results  Component Value Date   CHOL 122 09/26/2023   Lab Results  Component Value Date   HDL 35 (L) 09/26/2023   Lab Results  Component Value Date   LDLCALC 59 09/26/2023   Lab Results  Component Value Date   TRIG 164 (H) 09/26/2023   Lab Results  Component Value Date   CHOLHDL 3.5 09/26/2023   Lab Results  Component Value Date   HGBA1C 5.5 09/26/2023      Assessment & Plan:   Problem List Items Addressed This Visit       Elevated ferritin   Labwork pending   Follow up with hematology as directed      Prediabetes Watch diet   Hgb A1c pending      Hypertension Continue meds  Labwork pending        Mixed hyperlipidemia   Relevant Medications    Continue crestor  10mg  qd   Other Relevant Orders   Lipid panel Continue meds Watch diet    Seizure like activity (HCC) Continue meds and follow up with neurology as directed Continue meds  GERD Rx for omeprazole  as directed   Coronary artery disease with stable angina pectoris (HCC) Continue meds as directed Labwork pending Follow up with cardiology as directed  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    Dispense:  10.7 g    Refill:  11    Supervising Provider:   SHERRE CLAPPER [id number]     Follow-up: Return  in about 4 months (around 05/30/2024) for chronic fasting follow-up also needs MCR wellness.    SARA R Thanvi Blincoe, PA-C

## 2024-02-01 ENCOUNTER — Ambulatory Visit: Payer: Self-pay | Admitting: Physician Assistant

## 2024-02-01 LAB — CBC WITH DIFFERENTIAL/PLATELET
Basophils Absolute: 0.1 x10E3/uL (ref 0.0–0.2)
Basos: 1 %
EOS (ABSOLUTE): 0.3 x10E3/uL (ref 0.0–0.4)
Eos: 3 %
Hematocrit: 47.8 % (ref 37.5–51.0)
Hemoglobin: 15.5 g/dL (ref 13.0–17.7)
Immature Grans (Abs): 0 x10E3/uL (ref 0.0–0.1)
Immature Granulocytes: 0 %
Lymphocytes Absolute: 2.9 x10E3/uL (ref 0.7–3.1)
Lymphs: 35 %
MCH: 31.6 pg (ref 26.6–33.0)
MCHC: 32.4 g/dL (ref 31.5–35.7)
MCV: 97 fL (ref 79–97)
Monocytes Absolute: 0.4 x10E3/uL (ref 0.1–0.9)
Monocytes: 5 %
Neutrophils Absolute: 4.7 x10E3/uL (ref 1.4–7.0)
Neutrophils: 56 %
Platelets: 142 x10E3/uL — ABNORMAL LOW (ref 150–450)
RBC: 4.91 x10E6/uL (ref 4.14–5.80)
RDW: 12.8 % (ref 11.6–15.4)
WBC: 8.3 x10E3/uL (ref 3.4–10.8)

## 2024-02-01 LAB — COMPREHENSIVE METABOLIC PANEL WITH GFR
ALT: 19 IU/L (ref 0–44)
AST: 19 IU/L (ref 0–40)
Albumin: 4.2 g/dL (ref 3.9–4.9)
Alkaline Phosphatase: 88 IU/L (ref 47–123)
BUN/Creatinine Ratio: 17 (ref 10–24)
BUN: 17 mg/dL (ref 8–27)
Bilirubin Total: 0.5 mg/dL (ref 0.0–1.2)
CO2: 24 mmol/L (ref 20–29)
Calcium: 9.3 mg/dL (ref 8.6–10.2)
Chloride: 103 mmol/L (ref 96–106)
Creatinine, Ser: 1.02 mg/dL (ref 0.76–1.27)
Globulin, Total: 2.6 g/dL (ref 1.5–4.5)
Glucose: 91 mg/dL (ref 70–99)
Potassium: 4.1 mmol/L (ref 3.5–5.2)
Sodium: 142 mmol/L (ref 134–144)
Total Protein: 6.8 g/dL (ref 6.0–8.5)
eGFR: 83 mL/min/1.73 (ref >59)

## 2024-02-01 LAB — B12 AND FOLATE PANEL
Folate: 14.1 ng/mL (ref >3.0)
Vitamin B-12: 1247 pg/mL — ABNORMAL HIGH (ref 232–1245)

## 2024-02-01 LAB — LIPID PANEL
Chol/HDL Ratio: 4.1 ratio (ref 0.0–5.0)
Cholesterol, Total: 146 mg/dL (ref 100–199)
HDL: 36 mg/dL — ABNORMAL LOW (ref >39)
LDL Chol Calc (NIH): 87 mg/dL (ref 0–99)
Triglycerides: 130 mg/dL (ref 0–149)
VLDL Cholesterol Cal: 23 mg/dL (ref 5–40)

## 2024-02-01 LAB — VITAMIN D 25 HYDROXY (VIT D DEFICIENCY, FRACTURES): Vit D, 25-Hydroxy: 66.8 ng/mL (ref 30.0–100.0)

## 2024-02-01 LAB — PSA: Prostate Specific Ag, Serum: 2.7 ng/mL (ref 0.0–4.0)

## 2024-02-01 LAB — HEMOGLOBIN A1C
Est. average glucose Bld gHb Est-mCnc: 105 mg/dL
Hgb A1c MFr Bld: 5.3 % (ref 4.8–5.6)

## 2024-02-01 LAB — IRON: Iron: 82 ug/dL (ref 38–169)

## 2024-02-01 LAB — TSH: TSH: 1.12 u[IU]/mL (ref 0.450–4.500)

## 2024-02-01 LAB — FERRITIN: Ferritin: 446 ng/mL — ABNORMAL HIGH (ref 30–400)

## 2024-02-07 ENCOUNTER — Inpatient Hospital Stay: Attending: Oncology | Admitting: Oncology

## 2024-02-07 ENCOUNTER — Telehealth: Payer: Self-pay | Admitting: Primary Care

## 2024-02-07 ENCOUNTER — Other Ambulatory Visit: Payer: Self-pay | Admitting: Cardiology

## 2024-02-07 VITALS — BP 111/78 | HR 84 | Temp 98.7°F | Resp 16 | Ht 64.0 in | Wt 280.5 lb

## 2024-02-07 DIAGNOSIS — I2510 Atherosclerotic heart disease of native coronary artery without angina pectoris: Secondary | ICD-10-CM | POA: Diagnosis not present

## 2024-02-07 DIAGNOSIS — I679 Cerebrovascular disease, unspecified: Secondary | ICD-10-CM | POA: Diagnosis not present

## 2024-02-07 DIAGNOSIS — Z79899 Other long term (current) drug therapy: Secondary | ICD-10-CM | POA: Insufficient documentation

## 2024-02-07 DIAGNOSIS — I509 Heart failure, unspecified: Secondary | ICD-10-CM | POA: Diagnosis not present

## 2024-02-07 DIAGNOSIS — G4730 Sleep apnea, unspecified: Secondary | ICD-10-CM

## 2024-02-07 DIAGNOSIS — C61 Malignant neoplasm of prostate: Secondary | ICD-10-CM | POA: Diagnosis present

## 2024-02-07 DIAGNOSIS — R7989 Other specified abnormal findings of blood chemistry: Secondary | ICD-10-CM

## 2024-02-07 NOTE — Progress Notes (Signed)
 [place] [facility] [address] [place],  [place]  [address] [phone]  Clinic Day:  02/08/2024  Referring physician: Nicholaus Credit, PA-C  HISTORY OF PRESENT ILLNESS:  The patient is a 63 y.o. male  who I was asked to reevaluate for an elevated ferritin level.  Recent labs in early November 2025 showed an elevated ferritin of 446.  His serum iron  was normal at 82.   Per his last clinic visit with me in 2024, hemochromatosis testing was done, came back negative for this iron  overload disorder being present.  This gentleman has multiple medical issues, including congestive heart failure, cerebrovascular disease, coronary artery disease, and prostate cancer.  He is currently undergoing treatment for his prostate cancer.  Overall, this gentleman denies having any particular decline in his health.  PHYSICAL EXAM:  Blood pressure 111/78, pulse 84, temperature 98.7 F (37.1 C), temperature source Oral, resp. rate 16, height 5' 4 (1.626 m), weight 280 lb 8 oz (127.2 kg), SpO2 94%. Wt Readings from Last 3 Encounters:  02/07/24 280 lb 8 oz (127.2 kg)  01/31/24 278 lb (126.1 kg)  01/06/24 280 lb (127 kg)   Body mass index is 48.15 kg/m. Performance status (ECOG): 1 - Symptomatic but completely ambulatory Physical Exam Constitutional:      Appearance: Normal appearance. He is not ill-appearing.  HENT:     Mouth/Throat:     Mouth: Mucous membranes are moist.     Pharynx: Oropharynx is clear. No oropharyngeal exudate or posterior oropharyngeal erythema.  Cardiovascular:     Rate and Rhythm: Normal rate and regular rhythm.     Heart sounds: No murmur heard.    No friction rub. No gallop.  Pulmonary:     Effort: Pulmonary effort is normal. No respiratory distress.     Breath sounds: Normal breath sounds. No wheezing, rhonchi or rales.  Abdominal:     General: Bowel sounds are normal. There is no distension.     Palpations: Abdomen is soft. There is no mass.      Tenderness: There is no abdominal tenderness.  Musculoskeletal:        General: No swelling.     Right lower leg: No edema.     Left lower leg: No edema.  Lymphadenopathy:     Cervical: No cervical adenopathy.     Upper Body:     Right upper body: No supraclavicular or axillary adenopathy.     Left upper body: No supraclavicular or axillary adenopathy.     Lower Body: No right inguinal adenopathy. No left inguinal adenopathy.  Skin:    General: Skin is warm.     Coloration: Skin is not jaundiced.     Findings: No lesion or rash.  Neurological:     General: No focal deficit present.     Mental Status: He is alert and oriented to person, place, and time. Mental status is at baseline.  Psychiatric:        Mood and Affect: Mood normal.        Behavior: Behavior normal.        Thought Content: Thought content normal.    LABS:      Latest Ref Rng & Units 01/31/2024   10:10 AM 11/13/2023    9:13 AM 09/26/2023    9:39 AM  CBC  WBC 3.4 - 10.8 x10E3/uL 8.3  7.8  8.3   Hemoglobin 13.0 - 17.7 g/dL 84.4  85.5  84.1   Hematocrit 37.5 -  51.0 % 47.8  42.3  48.4   Platelets 150 - 450 x10E3/uL 142  152  150       Latest Ref Rng & Units 01/31/2024   10:10 AM 11/13/2023    9:13 AM 09/26/2023    9:39 AM  CMP  Glucose 70 - 99 mg/dL 91  879  875   BUN 8 - 27 mg/dL 17  12  11    Creatinine 0.76 - 1.27 mg/dL 8.97  9.15  8.91   Sodium 134 - 144 mmol/L 142  142  140   Potassium 3.5 - 5.2 mmol/L 4.1  3.6  3.7   Chloride 96 - 106 mmol/L 103  107  100   CO2 20 - 29 mmol/L 24  24  22    Calcium  8.6 - 10.2 mg/dL 9.3  8.9  9.2   Total Protein 6.0 - 8.5 g/dL 6.8  6.6  6.8   Total Bilirubin 0.0 - 1.2 mg/dL 0.5  0.6  0.7   Alkaline Phos 47 - 123 IU/L 88  104  107   AST 0 - 40 IU/L 19  22  24    ALT 0 - 44 IU/L 19  25  25      Latest Reference Range & Units 01/31/24 10:06  Iron  38 - 169 ug/dL 82  Ferritin 30 - 599 ng/mL 446 (H)  (H): Data is abnormally high    Latest Reference Range & Units 05/22/23  10:40 09/26/23 09:39 11/13/23 09:13  Iron  45 - 182 ug/dL 90 897 79  UIBC ug/dL 755 771 694  TIBC 749 - 450 ug/dL 665 669 615  Saturation Ratios 17.9 - 39.5 %   21  Ferritin 24 - 336 ng/mL 717 (H) 535 (H) 225  (H): Data is abnormally high  Hemochromatosis test results [11-14-2022]: c.845G>A (p.Cys282Tyr) - Not Detected  c.187C>G (p.His63Asp) - Not Detected  c.193A>T (p.Ser65Cys) - Not Detected  Not associated with increased risk to develop clinical symptoms  of Hereditary Hemochromatosis. In symptomatic individuals, other  causes of iron  overload should be evaluated.   ASSESSMENT & PLAN:  A 63 y.o. male with a history of an intermittently elevated ferritin level.  The patient understands that I do believe his multiple medical issues cause his ferritin level to become intermittently elevated, as it is an acute phase reactant.  He has already undergone hemochromatosis testing, which came back negative.  From a hematologic standpoint, I do not see anything at all which has me concerned that something more ominous is present that is causing his ferritin to become intermittently elevated.  I would not recommend that his ferritin level be rechecked in the future unless there is a concern about iron  deficiency being present.  In such a scenario, the concern would be that his ferritin would come back low.  I see no time in the future where his elevated ferritin level will require any intervention, particularly in the absence of hemochromatosis.  I do feel comfortable turning his care back over to his primary care provider.  The patient understands all the plans discussed today and is in agreement with them.  Ainara Eldridge DELENA Kerns, MD

## 2024-02-07 NOTE — Telephone Encounter (Signed)
 I discussed sleep study with patient Please order a new CPAP pressure settings 10-16cm h20 airfit F20 full face mask  DME [facility]  re: moderate OSA split night sleep study done on 01/06/24 We can move apt next week out 6 weeks

## 2024-02-07 NOTE — Telephone Encounter (Signed)
 Randall Rojas,   Does pt already have a cpap machine or can I order that too? The note is only for the settings?

## 2024-02-08 NOTE — Telephone Encounter (Signed)
 Atc x1. LMTCB.   Not sure who pt's current DME is.

## 2024-02-08 NOTE — Telephone Encounter (Signed)
 Needs an order for new machine at settings 10-16cm with airfit F20 full face mask

## 2024-02-11 NOTE — Telephone Encounter (Signed)
 I called and spoke to pt. Pt informed of Beths note and verbalized understanding. Pt would like to know if he is still going to use his 1L O2 HS with his cpap.   This was not specified in the order notes so I will route to BW to clarify if the 1L O2 HS needs to be bled into the cpap machine before I order through [employer].   Pts appt has been rescheduled for January 2nd but pt was informed to keep tomorrows scheduled PFT.

## 2024-02-12 ENCOUNTER — Ambulatory Visit: Admitting: Primary Care

## 2024-02-12 ENCOUNTER — Ambulatory Visit (INDEPENDENT_AMBULATORY_CARE_PROVIDER_SITE_OTHER): Admitting: *Deleted

## 2024-02-12 DIAGNOSIS — J4520 Mild intermittent asthma, uncomplicated: Secondary | ICD-10-CM

## 2024-02-12 LAB — PULMONARY FUNCTION TEST
DL/VA % pred: 92 %
DL/VA: 3.93 ml/min/mmHg/L
DLCO cor % pred: 115 %
DLCO cor: 26.8 ml/min/mmHg
DLCO unc % pred: 118 %
DLCO unc: 27.45 ml/min/mmHg
FEF 25-75 Post: 3.33 L/s
FEF 25-75 Pre: 3.57 L/s
FEF2575-%Change-Post: -6 %
FEF2575-%Pred-Post: 140 %
FEF2575-%Pred-Pre: 150 %
FEV1-%Change-Post: 0 %
FEV1-%Pred-Post: 123 %
FEV1-%Pred-Pre: 123 %
FEV1-Post: 3.56 L
FEV1-Pre: 3.56 L
FEV1FVC-%Change-Post: -1 %
FEV1FVC-%Pred-Pre: 105 %
FEV6-%Change-Post: 2 %
FEV6-%Pred-Post: 124 %
FEV6-%Pred-Pre: 121 %
FEV6-Post: 4.54 L
FEV6-Pre: 4.43 L
FEV6FVC-%Change-Post: 0 %
FEV6FVC-%Pred-Post: 105 %
FEV6FVC-%Pred-Pre: 104 %
FVC-%Change-Post: 1 %
FVC-%Pred-Post: 118 %
FVC-%Pred-Pre: 116 %
FVC-Post: 4.56 L
FVC-Pre: 4.5 L
Post FEV1/FVC ratio: 78 %
Post FEV6/FVC ratio: 100 %
Pre FEV1/FVC ratio: 79 %
Pre FEV6/FVC Ratio: 99 %
RV % pred: 108 %
RV: 2.2 L
TLC % pred: 114 %
TLC: 6.89 L

## 2024-02-12 NOTE — Patient Instructions (Signed)
 Full PFT performed today.

## 2024-02-12 NOTE — Progress Notes (Signed)
 Full PFT performed today.

## 2024-02-15 ENCOUNTER — Encounter (HOSPITAL_BASED_OUTPATIENT_CLINIC_OR_DEPARTMENT_OTHER): Payer: Self-pay | Admitting: Pharmacist Clinician (PhC)/ Clinical Pharmacy Specialist

## 2024-02-18 MED ORDER — ZEPBOUND 12.5 MG/0.5ML ~~LOC~~ SOAJ: 12.5000 mg | SUBCUTANEOUS | 0 refills | Status: DC

## 2024-02-19 ENCOUNTER — Other Ambulatory Visit: Payer: Self-pay | Admitting: Physician Assistant

## 2024-02-19 NOTE — Telephone Encounter (Signed)
 He did not need supplemental oxygen  during split night study

## 2024-02-20 NOTE — Telephone Encounter (Signed)
 Spoke with the pt and notified of response per Beth  Pt verbalized understanding  Order placed to d/c noct o2

## 2024-02-25 ENCOUNTER — Ambulatory Visit: Payer: Self-pay | Admitting: Primary Care

## 2024-02-25 ENCOUNTER — Other Ambulatory Visit: Payer: Self-pay | Admitting: Physician Assistant

## 2024-02-25 DIAGNOSIS — R110 Nausea: Secondary | ICD-10-CM

## 2024-02-25 MED ORDER — KETOCONAZOLE 2 % EX SHAM: MEDICATED_SHAMPOO | CUTANEOUS | 1 refills | Status: DC

## 2024-02-25 NOTE — Telephone Encounter (Signed)
 Reviewed with patient. During diagnostic portion AHI 13.4/hour. Mean oxygen  saturation was 85.6%.  The lowest oxygen  saturation during sleep was 57.0%.  Time spent <=88% oxygen  saturation was 116.7 minutes (79.4%). Treatment portion. Mean oxygen  saturation was 90.8%.  The lowest oxygen  saturation during sleep was 68.0%.  Time spent <=88% oxygen  saturation was 8.3 minutes (3.2%). Oxygen  was not used or recommended, change CPAP PRESSURE 10-16CM H20 with airfit F20 full face mask. Would check ONO on CPAP at follow-up and review PFTs

## 2024-03-22 ENCOUNTER — Other Ambulatory Visit: Payer: Self-pay | Admitting: Cardiology

## 2024-03-26 ENCOUNTER — Telehealth: Payer: Self-pay

## 2024-03-26 NOTE — Telephone Encounter (Signed)
 I could not find pt in [facility]. Pt is scheduled to see Almarie Ferrari, NP on Friday, 03-28-2024.   I called and spoke to [facility] with [employer] to see if he could find a compliance report. Arvella can not find an updated compliance report for this as no data is shown past January 2025. Brad tagged our office. NFN

## 2024-03-27 NOTE — Progress Notes (Unsigned)
 "  @Patient  ID: Randall Rojas, male    DOB: 05-08-1960, 64 y.o.   MRN: [id number]  No chief complaint on file.   Referring provider: Nicholaus Credit, PA-C  HPI: 64 year old male. PMH significant for HTN, non-obstructive CAD, CHF, asthma, sleep apnea, GERD, prostate cancer, insomnia, hx CVA, hyperlipidemia.   Previous [facility] pulmonary encounter 11/20/2023 Discussed the use of AI scribe software for clinical note transcription with the patient, who gave verbal consent to proceed. History of Present Illness Randall Rojas is a 64 year old male with sleep apnea who presents for a sleep consult.  Sleep study on 03/25/2021 showed moderate-severe OSA, AHI 29.2/hour. He has been experiencing issues with his current CPAP machine, a DreamStation, which only allows him to sleep for four hours before it becomes ineffective. Previously, he used a different machine from [facility], which was effective, but since switching to the new machine, his sleep quality has deteriorated. His oxygen  levels drop at night, requiring supplemental oxygen  at 1 liter per minute.  He has experienced significant weight loss, approximately 30 to 40 pounds, since 2022, which he attributes to being on Wegovy . His weight has plateaued around 290 pounds, fluctuating slightly. He is on the highest dose of Wegovy , 2.4 mg.  He has a history of asthma and uses albuterol  as needed, particularly during the fall and spring when his allergies worsen. He also takes montelukast regularly. His asthma symptoms are more pronounced during high pollen seasons, requiring the use of a nebulizer and albuterol  inhaler.  He was previously on Trelegy for shortness of breath, particularly when walking or climbing stairs, but discontinued it due to adverse effects from dry powder inhaler.   He reports minimal daytime sleepiness with Epworth score of six. He does snore when not using the CPAP. He experiences shortness of breath with exertion and during high  pollen seasons.    03/28/2024- Interim hx  Discussed the use of AI scribe software for clinical note transcription with the patient, who gave verbal consent to proceed. History of Present Illness Randall Rojas is a 64 year old male with obstructive sleep apnea who presents with CPAP intolerance and asthma management.  He stopped using his CPAP machine in August 2025 due to the device shutting off after three hours of use. Despite contacting his DME company, [employer], for several months, the issue remains unresolved. He wishes to switch back to his previous supplier, [facility], as he had effective treatment with them before. A split night sleep study in October 2025 showed mild to moderate sleep apnea with an AHI of 20 events per hour. CPAP pressures were adjusted from 7 to 16, with a final effective pressure of 15. He did not require supplemental oxygen  during the study, although he has used oxygen  at home in the past.  He has a history of intermittent asthma with seasonal exacerbations, particularly in the fall and spring. Previously on Trelegy but experienced side effects. He is currently [employer] as a rescue inhaler, taking two puffs every six hours as needed.  He is on Zepbound  12.5 mg for weight management. Previous on Wegovy  2.5mg . It suppresses his appetite effectively, although he experienced some weight gain over the holidays. No side effects such as nausea, vomiting, diarrhea, or constipation, although he uses a stool softener or Miralax as needed for bowel regularity.   Allergies[1]  Immunization History  Administered Date(s) Administered   Fluad Trivalent(High Dose 65+) 12/07/2022   Influenza Inj Mdck Quad  Pf 12/03/2019, 12/15/2020, 12/26/2021   Influenza,trivalent, recombinat, inj, PF 11/29/2023   Influenza-Unspecified 01/26/2015, 12/03/2019   PNEUMOCOCCAL CONJUGATE-20 12/26/2021   Pneumococcal Polysaccharide-23 12/03/2019   Tdap 06/17/2020    Past Medical History:   Diagnosis Date   Acute laryngopharyngitis 11/11/2020   Acute URI 12/12/2019   Anxiety 06/17/2020   Arthralgia 06/17/2020   Asthma    Bilateral leg edema 06/23/2020   Chest pain 04/15/2015   Chronic diastolic heart failure (HCC) 08/05/2020   Chronic heart failure with preserved ejection fraction (HCC) 07/11/2020   Coronary artery calcification seen on CT scan 04/15/2015   Coronary artery disease    Coronary artery disease involving native coronary artery of native heart without angina pectoris 07/11/2020   Coronary artery disease of native artery of native heart with stable angina pectoris 06/23/2020   Essential hypertension 04/15/2015   Exertional dyspnea 05/26/2020   Frequent headaches 07/20/2020   GERD (gastroesophageal reflux disease) 05/27/2019   History of CVA in adulthood 08/05/2020   HLD (hyperlipidemia) 07/11/2020   Hyperglycemia 06/06/2015   Hyperlipidemia 04/15/2015   Hypertension 08/05/2020   Hypokalemia 07/20/2020   Left sided numbness 07/25/2020   Leukocytosis 06/06/2015   Malaise 06/17/2020   Mixed hyperlipidemia 04/15/2015   Morbid obesity (HCC) 04/15/2015   Need for prophylactic vaccination against Streptococcus pneumoniae (pneumococcus) 12/03/2019   Need for prophylactic vaccination and inoculation against influenza 12/03/2019   Need for tetanus booster 06/17/2020   Nonepileptic episode (HCC) 10/20/2020   Obesity, Class III, BMI 40-49.9 (morbid obesity) (HCC) 04/15/2015   Other sleep apnea 07/20/2020   Palpitations 08/05/2020   Pneumonia due to COVID-19 virus 04/06/2019   Prediabetes 06/23/2020   Prostate cancer screening 06/02/2019   Rectal bleeding 11/01/2020   Seizure-like activity (HCC) 05/26/2020   Shortness of breath 06/23/2020   TIA (transient ischemic attack)    Upper extremity weakness 05/26/2020   Weakness of left lower extremity 05/26/2020    Tobacco History: Tobacco Use History[2] Counseling given: Not Answered   Outpatient Medications Prior to Visit  Medication Sig Dispense Refill    Accu-Chek Softclix Lancets lancets Use up to 4 times daily as directed. 100 each 0   acetaminophen  (TYLENOL ) 500 MG tablet Take by mouth.     Albuterol -Budesonide (AIRSUPRA ) 90-80 MCG/ACT AERO Inhale 2 puffs into the lungs every 6 (six) hours as needed. 10.7 g 1   amitriptyline  (ELAVIL ) 50 MG tablet TAKE ONE TABLET BY MOUTH AT BEDTIME 90 tablet 1   baclofen (LIORESAL) 10 MG tablet Take 10-20 tablets by mouth every 6 (six) hours as needed (pain).     benzonatate  (TESSALON ) 200 MG capsule Take 1 capsule (200 mg total) by mouth 3 (three) times daily as needed for cough. 30 capsule 1   blood glucose meter kit and supplies KIT Dispense based on patient and insurance preference. Use up to four times daily as directed. 1 each 0   budesonide-glycopyrrolate-formoterol (BREZTRI  AEROSPHERE) 160-9-4.8 MCG/ACT AERO inhaler Inhale 2 puffs into the lungs 2 (two) times daily. 10.7 g 11   bumetanide  (BUMEX ) 2 MG tablet Take 1 tablet (2 mg total) by mouth 2 (two) times daily. 180 tablet 0   busPIRone (BUSPAR) 30 MG tablet Take 30 mg by mouth 2 (two) times daily.     finasteride (PROSCAR) 5 MG tablet Take 5 mg by mouth daily.     GOODSENSE ASPIRIN  81 MG chewable tablet Chew 81 mg by mouth daily.     ibuprofen (ADVIL) 800 MG tablet Take 800 mg by mouth  3 (three) times daily as needed.     ipratropium-albuterol  (DUONEB) 0.5-2.5 (3) MG/3ML SOLN Inhale into the lungs.     ketoconazole  (NIZORAL ) 2 % shampoo Apply topically 2 (two) times a week. 120 mL 1   Lacosamide 100 MG TABS Take 1 tablet by mouth once daily at bedtime x1 week, then take 1 tablet by mouth twice a day x1 week, then take 1 tablet by mouth every morning and 2 tablets by mouth at night x1 week, then take 2 tablets by mouth every morning and 2 tablets by mouth at night thereafter     levETIRAcetam  (KEPPRA ) 750 MG tablet Take 1,500 mg by mouth 2 (two) times daily.     meclizine (ANTIVERT) 25 MG tablet Take 25 mg by mouth 3 (three) times daily as needed.      Misc Natural Products (URINOZINC PLUS PO) Take by mouth daily.     modafinil (PROVIGIL) 200 MG tablet Take 200 mg by mouth 2 (two) times daily.     montelukast (SINGULAIR) 10 MG tablet Take 10 mg by mouth at bedtime.     nitroGLYCERIN  (NITROSTAT ) 0.4 MG SL tablet Place 1 tablet (0.4 mg total) under the tongue every 5 (five) minutes as needed for chest pain. Up to three times. 45 tablet 3   omeprazole  (PRILOSEC) 40 MG capsule Take 1 capsule (40 mg total) by mouth daily. 90 capsule 1   OXYGEN  1 L. With CPAP     potassium chloride  SA (KLOR-CON  M) 20 MEQ tablet Take 1 tablet by mouth twice daily. 180 tablet 3   promethazine  (PHENERGAN ) 25 MG tablet Take 1 tablet by mouth every 6 hours as needed for nausea or vomiting. 30 tablet 1   ranolazine  (RANEXA ) 500 MG 12 hr tablet Take 1 tablet by mouth 2 times daily. 180 tablet 2   rosuvastatin  (CRESTOR ) 10 MG tablet Take 1 tablet (10 mg total) by mouth daily. 90 tablet 3   sertraline (ZOLOFT) 100 MG tablet Take 200 mg by mouth daily.     tadalafil (CIALIS) 20 MG tablet SMARTSIG:1 Tablet(s) By Mouth     tirzepatide  (ZEPBOUND ) 12.5 MG/0.5ML Pen Inject 12.5 mg into the skin once a week. 2 mL 0   traZODone (DESYREL) 50 MG tablet Take 100 mg by mouth at bedtime as needed.     triamcinolone  cream (KENALOG ) 0.1 % Apply to the affected area(s) twice a day as directed 30 g 1   vitamin B-12 (CYANOCOBALAMIN ) 1000 MCG tablet Take 1,000 mcg by mouth daily.     Vitamin D , Ergocalciferol , (DRISDOL ) 1.25 MG (50000 UNIT) CAPS capsule Take 1 capsule by mouth once every 7 days as directed 12 capsule 1   No facility-administered medications prior to visit.    Review of Systems  Review of Systems  Constitutional: Negative.   Respiratory: Negative.    Cardiovascular: Negative.   Psychiatric/Behavioral: Negative.     Physical Exam  There were no vitals taken for this visit. Physical Exam Constitutional:      General: He is not in acute distress.    Appearance:  Normal appearance. He is well-developed. He is not ill-appearing.  HENT:     Head: Normocephalic and atraumatic.     Mouth/Throat:     Mouth: Mucous membranes are moist.     Pharynx: Oropharynx is clear.  Cardiovascular:     Rate and Rhythm: Normal rate and regular rhythm.     Heart sounds: Normal heart sounds.  Pulmonary:     Effort:  Pulmonary effort is normal. No respiratory distress.     Breath sounds: Normal breath sounds. No wheezing or rhonchi.  Musculoskeletal:        General: Normal range of motion.     Cervical back: Normal range of motion and neck supple.  Skin:    General: Skin is warm and dry.     Findings: No erythema or rash.  Neurological:     General: No focal deficit present.     Mental Status: He is alert and oriented to person, place, and time. Mental status is at baseline.  Psychiatric:        Mood and Affect: Mood normal.        Behavior: Behavior normal.        Thought Content: Thought content normal.        Judgment: Judgment normal.     Lab Results:  CBC    Component Value Date/Time   WBC 8.3 01/31/2024 1010   WBC 7.8 11/13/2023 0913   WBC 5.8 04/07/2019 0334   RBC 4.91 01/31/2024 1010   RBC 4.58 11/13/2023 0913   HGB 15.5 01/31/2024 1010   HCT 47.8 01/31/2024 1010   PLT 142 (L) 01/31/2024 1010   MCV 97 01/31/2024 1010   MCH 31.6 01/31/2024 1010   MCH 31.4 11/13/2023 0913   MCHC 32.4 01/31/2024 1010   MCHC 34.0 11/13/2023 0913   RDW 12.8 01/31/2024 1010   LYMPHSABS 2.9 01/31/2024 1010   MONOABS 0.4 11/13/2023 0913   EOSABS 0.3 01/31/2024 1010   BASOSABS 0.1 01/31/2024 1010    BMET    Component Value Date/Time   NA 142 01/31/2024 1010   K 4.1 01/31/2024 1010   CL 103 01/31/2024 1010   CO2 24 01/31/2024 1010   GLUCOSE 91 01/31/2024 1010   GLUCOSE 120 (H) 11/13/2023 0913   BUN 17 01/31/2024 1010   CREATININE 1.02 01/31/2024 1010   CREATININE 0.84 11/13/2023 0913   CREATININE 0.98 04/14/2015 1051   CALCIUM  9.3 01/31/2024 1010    GFRNONAA >60 11/13/2023 0913   GFRAA 79 12/03/2019 0909    BNP    Component Value Date/Time   BNP 14.8 04/06/2019 0807   BNP 18.4 04/14/2015 1051    ProBNP No results found for: PROBNP  Imaging: No results found.   Assessment & Plan:   1. Asthma, unspecified asthma severity, unspecified whether complicated, unspecified whether persistent (Primary)  2. Moderate obstructive sleep apnea - Ambulatory Referral for DME   Assessment and Plan Assessment & Plan Moderate obstructive sleep apnea Previously intolerant to CPAP due to issues with current machine from [employer]. Previous CPAP from [facility] was effective. Patient stopped wearing CPAP in August 2025. Patient had an updated split night sleep study in October 2025, showing an AHI of 20 events per hour (AHI 3%). Optimal CPAP pressure of 15cm h20. Oxygen  levels dropped to <88% for 8.3 minutes during treatment portion of study. - Sent CPAP prescription to [facility] with pressure setting of 15cm h20 - Continue 1.5L oxygen  with CPAP at night  - Will schedule compliance check in 6-8 weeks after starting new CPAP. - Will consider repeat overnight oximetry test if oxygen  levels are a concern at follow-up.  Intermittent asthma Seasonal exacerbations, particularly in fall and spring. Previous use of Trelegy resulted in side effects due to DPI. Currently using Air Supra as a rescue inhaler, which contains albuterol  and a steroid. Recent breathing test in October showed normal lung function with no obstructive lung disease. - Refilled  Air Supra inhaler with three refills. - Use Air Supra 2 puffs every 4-6 hours as needed for chest tightness, wheezing, or shortness of breath. - Will consider maintenance inhaler if Air Supra is needed multiple times daily   Obesity Management with Zepbound , currently on 12.5 mg dose. Reports appetite suppression but noted weight gain during holidays. No side effects reported from Zepbound . Previous use of Wegovy  at  highest dose of 2.5 mg. - Increase Zepbound  to 15 mg after completing current 12.5 mg dose. - Continue diet and exercise regimen.  Constipation Likely related to Zepbound  use, which slows gastric emptying. Currently using stool softeners and Miralax. - Recommended daily Miralax to maintain regular bowel movements. - Consider Dulcolax, Bisacodyl suppository or Milk of Magnesia if Miralax is insufficient.    Almarie LELON Ferrari, NP 03/27/2024     [1]  Allergies Allergen Reactions   Ubrogepant Other (See Comments) and Swelling   Cephalexin Nausea And Vomiting, Rash and Other (See Comments)   Isosorbide  Nitrate Other (See Comments)    Blood pressure and heart rate decrease  [2]  Social History Tobacco Use  Smoking Status Former   Current packs/day: 0.00   Average packs/day: 3.0 packs/day   Types: Cigarettes   Quit date: 04/13/1989   Years since quitting: 34.9  Smokeless Tobacco Never   "

## 2024-03-28 ENCOUNTER — Ambulatory Visit: Admitting: Primary Care

## 2024-03-28 ENCOUNTER — Encounter: Payer: Self-pay | Admitting: Primary Care

## 2024-03-28 VITALS — BP 134/62 | HR 86 | Temp 96.2°F | Ht 64.0 in | Wt 285.8 lb

## 2024-03-28 DIAGNOSIS — E669 Obesity, unspecified: Secondary | ICD-10-CM | POA: Diagnosis not present

## 2024-03-28 DIAGNOSIS — G4739 Other sleep apnea: Secondary | ICD-10-CM

## 2024-03-28 DIAGNOSIS — J45909 Unspecified asthma, uncomplicated: Secondary | ICD-10-CM | POA: Diagnosis not present

## 2024-03-28 DIAGNOSIS — K5900 Constipation, unspecified: Secondary | ICD-10-CM | POA: Diagnosis not present

## 2024-03-28 DIAGNOSIS — G4733 Obstructive sleep apnea (adult) (pediatric): Secondary | ICD-10-CM | POA: Diagnosis not present

## 2024-03-28 DIAGNOSIS — Z6841 Body Mass Index (BMI) 40.0 and over, adult: Secondary | ICD-10-CM | POA: Diagnosis not present

## 2024-03-28 MED ORDER — ZEPBOUND 15 MG/0.5ML ~~LOC~~ SOAJ: 15.0000 mg | SUBCUTANEOUS | 3 refills | Status: AC

## 2024-03-28 MED ORDER — AIRSUPRA 90-80 MCG/ACT IN AERO: 2.0000 | INHALATION_SPRAY | Freq: Four times a day (QID) | RESPIRATORY_TRACT | 3 refills | Status: AC | PRN

## 2024-03-28 NOTE — Patient Instructions (Addendum)
" °  VISIT SUMMARY: You came in today to discuss your obstructive sleep apnea, asthma management, and weight management. We also addressed your issues with CPAP intolerance and constipation.  YOUR PLAN: -MODERATE OBSTRUCTIVE SLEEP APNEA: Obstructive sleep apnea is a condition where your breathing stops and starts repeatedly during sleep. We confirmed this with a sleep study showing 20 events per hour. You have had issues with your current CPAP machine, so we sent a new prescription to your previous supplier, [facility], with a pressure setting of 15. We will check your compliance in 6-8 weeks. Continue using oxygen  at 1.5 liters at bedtime with CPAP, and we may repeat an overnight oximetry test if there are concerns about your oxygen  levels.  -INTERMITTENT ASTHMA: Asthma is a condition where your airways narrow and swell, making it hard to breathe. You experience seasonal flare-ups, especially in the fall and spring. You are currently using [employer] as a rescue inhaler. We have refilled your prescription for Air Supra with three refills. Use it as needed for chest tightness, wheezing, or shortness of breath. If you need it multiple times daily without a viral infection or allergies, we may consider a maintenance inhaler.  -OBESITY: Obesity is a condition where you have an excessive amount of body fat. You are managing this with Zepbound , which suppresses your appetite. You reported some weight gain during the holidays but no side effects. We will increase your Zepbound  dose to 15 mg after you finish your current 12.5 mg dose. Continue with your diet and exercise regimen.  -CONSTIPATION: Constipation is when you have infrequent or hard-to-pass bowel movements. This is likely related to your use of Zepbound , which can slow down your digestive system. We recommend taking colace and Miralax daily to maintain regular bowel movements. If Miralax is not enough, you can consider using Dulcolax, Senokot, bisacodyl  suppository or Milk of Magnesia.  INSTRUCTIONS: We will schedule a compliance check for your new CPAP machine in 6-8 weeks. Continue using oxygen  at 1.5 liters as needed. If you have concerns about your oxygen  levels, we may repeat an overnight oximetry test. Follow up with us  if you need to use your Air Supra inhaler multiple times daily without a viral infection or allergies, as we may need to consider a maintenance inhaler.  Orders: New CPAP machine with pressure settings 15cm h20-  sent to [facility] Refilled Airsupra  RX for Zepbound    Follow-up 6-8 weeks CPAP compliance with Landry NP  "

## 2024-04-08 ENCOUNTER — Ambulatory Visit: Payer: Self-pay

## 2024-04-08 ENCOUNTER — Telehealth: Payer: Self-pay

## 2024-04-08 ENCOUNTER — Ambulatory Visit (INDEPENDENT_AMBULATORY_CARE_PROVIDER_SITE_OTHER)

## 2024-04-08 VITALS — BP 110/66 | Ht 64.0 in | Wt 279.0 lb

## 2024-04-08 DIAGNOSIS — Z0000 Encounter for general adult medical examination without abnormal findings: Secondary | ICD-10-CM | POA: Diagnosis not present

## 2024-04-08 NOTE — Telephone Encounter (Signed)
-----   Message from Camie Moats, [place] sent at 04/08/2024  4:01 PM EST ----- Recommend pt make appt to discuss - may need referral to GI for endoscopy if symptoms moderate to severe ----- Message ----- From: Anderson Lyle POUR, CMA Sent: 04/08/2024   3:37 PM EST To: Camie Moats, PA-C  Patient was seen today for his AWV and wanted to know if his Omeprazole  could be changed to twice daily because he is having more heartburn and nausea because of it.

## 2024-04-08 NOTE — Telephone Encounter (Signed)
 Patient called in with regards to his heartburn medication, omeprazole  40 MG. Patient stated his heartburn has worsened over past 2-3 months. Patient reported symptoms are now causing him to wake-up in the middle of the night and vomit. Per chart, provider advised OV for evaluation of symptoms. Call disconnected before triage could be completed. This RN made one outbound attempt to both numbers listed in chart. No answer, unable to LVM. Routing for additional attempts.  Copied from CRM #[id number]. Topic: Appointments - Appointment Scheduling >> Apr 08, 2024  4:15 PM Victoria B wrote: Patient called in states, has heartburn , wants dosage increased for, also severe pain in both knee an back

## 2024-04-08 NOTE — Patient Instructions (Signed)
 [name],  Thank you for taking the time for your Medicare Wellness Visit. I appreciate your continued commitment to your health goals. Please review the care plan we discussed, and feel free to reach out if I can assist you further.  Please note that Annual Wellness Visits do not include a physical exam. Some assessments may be limited, especially if the visit was conducted virtually. If needed, we may recommend an in-person follow-up with your provider.  Ongoing Care Seeing your primary care provider every 3 to 6 months helps us  monitor your health and provide consistent, personalized care.   Referrals If a referral was made during today's visit and you haven't received any updates within two weeks, please contact the referred provider directly to check on the status.  Recommended Screenings:  Health Maintenance  Topic Date Due   Medicare Annual Wellness Visit  02/08/2024   Zoster (Shingles) Vaccine (1 of 2) 05/25/2025*   Colon Cancer Screening  01/30/2028   DTaP/Tdap/Td vaccine (2 - Td or Tdap) 06/18/2030   Pneumococcal Vaccine for age over 43  Completed   Flu Shot  Completed   HIV Screening  Completed   Hepatitis B Vaccine  Aged Out   HPV Vaccine  Aged Out   Meningitis B Vaccine  Aged Out   COVID-19 Vaccine  Discontinued   Hepatitis C Screening  Discontinued  *Topic was postponed. The date shown is not the original due date.       04/08/2024    8:09 AM  Advanced Directives  Does Patient Have a Medical Advance Directive? No  Would patient like information on creating a medical advance directive? No - Patient declined    Vision: Annual vision screenings are recommended for early detection of glaucoma, cataracts, and diabetic retinopathy. These exams can also reveal signs of chronic conditions such as diabetes and high blood pressure.  Dental: Annual dental screenings help detect early signs of oral cancer, gum disease, and other conditions linked to overall health,  including heart disease and diabetes.  Please see the attached documents for additional preventive care recommendations.

## 2024-04-08 NOTE — Progress Notes (Signed)
 " I connected with  Randall Rojas on 04/08/2024  audio   Patient Location: Home  Provider Location: Office/Clinic  Persons Participating in Visit: Patient.  nted were not able to be obtained and vitals that have been documented are patient reported.  Chief Complaint  Patient presents with   Medicare Wellness     Subjective:   Randall Rojas is a 64 y.o. male who presents for a Medicare Annual Wellness Visit.  Visit info / Clinical Intake: Medicare Wellness Visit Type:: Initial Annual Wellness Visit Persons participating in visit and providing information:: patient Medicare Wellness Visit Mode:: Telephone If telephone:: video declined Since this visit was completed virtually, some vitals may be partially provided or unavailable. Missing vitals are due to the limitations of the virtual format.: Documented vitals are patient reported If Telephone or Video please confirm:: I connected with patient using audio/video enable telemedicine. I verified patient identity with two identifiers, discussed telehealth limitations, and patient agreed to proceed. Patient Location:: home Provider Location:: office/clinic Interpreter Needed?: No Pre-visit prep was completed: yes AWV questionnaire completed by patient prior to visit?: yes Date:: 04/08/24 Living arrangements:: (Patient-Rptd) lives with spouse/significant other Patient's Overall Health Status Rating: (!) poor (patient states he reported this for pain , and dizziness) Typical amount of pain: (!) (Patient-Rptd) a lot Does pain affect daily life?: (!) (Patient-Rptd) yes Are you currently prescribed opioids?: no  Dietary Habits and Nutritional Risks How many meals a day?: (Patient-Rptd) 2 Eats fruit and vegetables daily?: (!) (Patient-Rptd) no Most meals are obtained by: (Patient-Rptd) preparing own meals In the last 2 weeks, have you had any of the following?: none Diabetic:: no  Functional Status Activities of Daily Living (to  include ambulation/medication): (Patient-Rptd) Independent Ambulation: Independent with device- listed below Home Assistive Devices/Equipment: Cane Medication Administration: (Patient-Rptd) Independent Home Management (perform basic housework or laundry): (Patient-Rptd) Independent Manage your own finances?: (Patient-Rptd) yes Primary transportation is: (Patient-Rptd) driving Concerns about vision?: no *vision screening is required for WTM* Concerns about hearing?: no  Fall Screening Falls in the past year?: (Patient-Rptd) 0 Number of falls in past year: 0 Was there an injury with Fall?: 0 Fall Risk Category Calculator: 0 Patient Fall Risk Level: Low Fall Risk  Fall Risk Patient at Risk for Falls Due to: No Fall Risks Fall risk Follow up: Falls evaluation completed  Home and Transportation Safety: All rugs have non-skid backing?: (Patient-Rptd) yes All stairs or steps have railings?: (Patient-Rptd) yes Grab bars in the bathtub or shower?: (!) (Patient-Rptd) no Have non-skid surface in bathtub or shower?: (Patient-Rptd) yes Good home lighting?: (Patient-Rptd) yes Regular seat belt use?: (Patient-Rptd) yes Hospital stays in the last year:: (Patient-Rptd) no  Cognitive Assessment Difficulty concentrating, remembering, or making decisions? : (Patient-Rptd) yes Will 6CIT or Mini Cog be Completed: yes What year is it?: 0 points What month is it?: 0 points Give patient an address phrase to remember (5 components): remember words apple , table , penny About what time is it?: 0 points Count backwards from 20 to 1: 0 points Say the months of the year in reverse: 0 points Repeat the address phrase from earlier: 0 points 6 CIT Score: 0 points  Advance Directives (For Healthcare) Does Patient Have a Medical Advance Directive?: No Would patient like information on creating a medical advance directive?: No - Patient declined  Reviewed/Updated  Reviewed/Updated: Reviewed All (Medical,  Surgical, Family, Medications, Allergies, Care Teams, Patient Goals)    Allergies (verified) Ubrogepant, Cephalexin, and Isosorbide  nitrate  Current Medications (verified) Outpatient Encounter Medications as of 04/08/2024  Medication Sig   Accu-Chek Softclix Lancets lancets Use up to 4 times daily as directed.   acetaminophen  (TYLENOL ) 500 MG tablet Take by mouth.   Albuterol -Budesonide (AIRSUPRA ) 90-80 MCG/ACT AERO Inhale 2 puffs into the lungs every 6 (six) hours as needed (Asthma exacerbation).   amitriptyline  (ELAVIL ) 50 MG tablet TAKE ONE TABLET BY MOUTH AT BEDTIME   baclofen (LIORESAL) 10 MG tablet Take 10-20 tablets by mouth every 6 (six) hours as needed (pain).   benzonatate  (TESSALON ) 200 MG capsule Take 1 capsule (200 mg total) by mouth 3 (three) times daily as needed for cough.   blood glucose meter kit and supplies KIT Dispense based on patient and insurance preference. Use up to four times daily as directed.   budesonide-glycopyrrolate-formoterol (BREZTRI  AEROSPHERE) 160-9-4.8 MCG/ACT AERO inhaler Inhale 2 puffs into the lungs 2 (two) times daily.   bumetanide  (BUMEX ) 2 MG tablet Take 1 tablet (2 mg total) by mouth 2 (two) times daily.   busPIRone (BUSPAR) 30 MG tablet Take 30 mg by mouth 2 (two) times daily.   finasteride (PROSCAR) 5 MG tablet Take 5 mg by mouth daily.   GOODSENSE ASPIRIN  81 MG chewable tablet Chew 81 mg by mouth daily.   ibuprofen (ADVIL) 800 MG tablet Take 800 mg by mouth 3 (three) times daily as needed.   ketoconazole  (NIZORAL ) 2 % shampoo Apply topically 2 (two) times a week.   Lacosamide 100 MG TABS Take 1 tablet by mouth once daily at bedtime x1 week, then take 1 tablet by mouth twice a day x1 week, then take 1 tablet by mouth every morning and 2 tablets by mouth at night x1 week, then take 2 tablets by mouth every morning and 2 tablets by mouth at night thereafter   levETIRAcetam  (KEPPRA ) 750 MG tablet Take 1,500 mg by mouth 2 (two) times daily.    meclizine (ANTIVERT) 25 MG tablet Take 25 mg by mouth 3 (three) times daily as needed.   Misc Natural Products (URINOZINC PLUS PO) Take by mouth daily.   modafinil (PROVIGIL) 200 MG tablet Take 200 mg by mouth 2 (two) times daily.   montelukast (SINGULAIR) 10 MG tablet Take 10 mg by mouth at bedtime.   nitroGLYCERIN  (NITROSTAT ) 0.4 MG SL tablet Place 1 tablet (0.4 mg total) under the tongue every 5 (five) minutes as needed for chest pain. Up to three times.   omeprazole  (PRILOSEC) 40 MG capsule Take 1 capsule (40 mg total) by mouth daily.   OXYGEN  1 L. With CPAP   potassium chloride  SA (KLOR-CON  M) 20 MEQ tablet Take 1 tablet by mouth twice daily.   promethazine  (PHENERGAN ) 25 MG tablet Take 1 tablet by mouth every 6 hours as needed for nausea or vomiting.   ranolazine  (RANEXA ) 500 MG 12 hr tablet Take 1 tablet by mouth 2 times daily.   rosuvastatin  (CRESTOR ) 10 MG tablet Take 1 tablet (10 mg total) by mouth daily.   sertraline (ZOLOFT) 100 MG tablet Take 200 mg by mouth daily.   tadalafil (CIALIS) 20 MG tablet SMARTSIG:1 Tablet(s) By Mouth   tirzepatide  (ZEPBOUND ) 15 MG/0.5ML Pen Inject 15 mg into the skin once a week.   traZODone (DESYREL) 50 MG tablet Take 100 mg by mouth at bedtime as needed.   triamcinolone  cream (KENALOG ) 0.1 % Apply to the affected area(s) twice a day as directed   vitamin B-12 (CYANOCOBALAMIN ) 1000 MCG tablet Take 1,000 mcg by mouth daily.  Vitamin D , Ergocalciferol , (DRISDOL ) 1.25 MG (50000 UNIT) CAPS capsule Take 1 capsule by mouth once every 7 days as directed   No facility-administered encounter medications on file as of 04/08/2024.    History: Past Medical History:  Diagnosis Date   Acute laryngopharyngitis 11/11/2020   Acute URI 12/12/2019   Anxiety 06/17/2020   Arthralgia 06/17/2020   Asthma    Bilateral leg edema 06/23/2020   Chest pain 04/15/2015   Chronic diastolic heart failure (HCC) 08/05/2020   Chronic heart failure with preserved ejection fraction  (HCC) 07/11/2020   Coronary artery calcification seen on CT scan 04/15/2015   Coronary artery disease    Coronary artery disease involving native coronary artery of native heart without angina pectoris 07/11/2020   Coronary artery disease of native artery of native heart with stable angina pectoris 06/23/2020   Essential hypertension 04/15/2015   Exertional dyspnea 05/26/2020   Frequent headaches 07/20/2020   GERD (gastroesophageal reflux disease) 05/27/2019   History of CVA in adulthood 08/05/2020   HLD (hyperlipidemia) 07/11/2020   Hyperglycemia 06/06/2015   Hyperlipidemia 04/15/2015   Hypertension 08/05/2020   Hypokalemia 07/20/2020   Left sided numbness 07/25/2020   Leukocytosis 06/06/2015   Malaise 06/17/2020   Mixed hyperlipidemia 04/15/2015   Morbid obesity (HCC) 04/15/2015   Need for prophylactic vaccination against Streptococcus pneumoniae (pneumococcus) 12/03/2019   Need for prophylactic vaccination and inoculation against influenza 12/03/2019   Need for tetanus booster 06/17/2020   Nonepileptic episode (HCC) 10/20/2020   Obesity, Class III, BMI 40-49.9 (morbid obesity) (HCC) 04/15/2015   Other sleep apnea 07/20/2020   Palpitations 08/05/2020   Pneumonia due to COVID-19 virus 04/06/2019   Prediabetes 06/23/2020   Prostate cancer screening 06/02/2019   Rectal bleeding 11/01/2020   Seizure-like activity (HCC) 05/26/2020   Shortness of breath 06/23/2020   TIA (transient ischemic attack)    Upper extremity weakness 05/26/2020   Weakness of left lower extremity 05/26/2020   Past Surgical History:  Procedure Laterality Date   CARDIAC CATHETERIZATION N/A 04/22/2015   Procedure: Left Heart Cath and Coronary Angiography;  Surgeon: Peter M Jordan, MD;  Location: [facility]ASIVE CV LAB;  Service: Cardiovascular;  Laterality: N/A;   GALLBLADDER SURGERY     LEFT HEART CATH AND CORONARY ANGIOGRAPHY N/A 10/28/2020   Procedure: LEFT HEART CATH AND CORONARY ANGIOGRAPHY;  Surgeon: Burnard Debby LABOR, MD;  Location: [facility] INVASIVE CV LAB;   Service: Cardiovascular;  Laterality: N/A;   SINUS SURGERY WITH INSTATRAK     Family History  Problem Relation Age of Onset   Hypertension Mother    Rheum arthritis Mother    Heart attack Father    Prostate cancer Father        METS TO BRAIN; H/O SKIN CANCER   Hypertension Sister    Heart disease Sister    Heart attack Sister    Hypertension Sister    Prostate cancer Brother        METS TO BONE AND LIVER   Breast cancer Paternal Aunt    Prostate cancer Paternal Uncle        METS TO LYMPH NODES   Cancer Paternal Uncle        UNKNOWN PRIMARY ORAL MALIGNANCY   Breast cancer Cousin    Social History   Occupational History   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   Occupation: DISABLED  Tobacco Use   Smoking status: Former    Current packs/day: 0.00    Average packs/day: 3.0 packs/day    Types: Cigarettes  Quit date: 04/13/1989    Years since quitting: 35.0   Smokeless tobacco: Never  Vaping Use   Vaping status: Never Used  Substance and Sexual Activity   Alcohol use: Never   Drug use: Never   Sexual activity: Not Currently   Tobacco Counseling Counseling given: Not Answered  SDOH Screenings   Food Insecurity: Food Insecurity Present (04/08/2024)  Housing: Low Risk (04/08/2024)  Transportation Needs: No Transportation Needs (04/08/2024)  Utilities: Not At Risk (04/08/2024)  Alcohol Screen: Low Risk (04/08/2024)  Depression (PHQ2-9): Low Risk (04/08/2024)  Financial Resource Strain: Medium Risk (04/08/2024)  Physical Activity: Patient Declined (04/08/2024)  Social Connections: Socially Integrated (04/08/2024)  Stress: No Stress Concern Present (04/08/2024)  Tobacco Use: Medium Risk (04/08/2024)  Health Literacy: Adequate Health Literacy (04/08/2024)   See flowsheets for full screening details  Depression Screen PHQ 2 & 9 Depression Scale- Over the past 2 weeks, how often have you been bothered by any of the following problems? Little interest or pleasure in doing things:  0 Feeling down, depressed, or hopeless (PHQ Adolescent also includes...irritable): 0 PHQ-2 Total Score: 0 Trouble falling or staying asleep, or sleeping too much: 1 Feeling tired or having little energy: 0 Poor appetite or overeating (PHQ Adolescent also includes...weight loss): 0 Feeling bad about yourself - or that you are a failure or have let yourself or your family down: 1 Trouble concentrating on things, such as reading the newspaper or watching television (PHQ Adolescent also includes...like school work): 0 Moving or speaking so slowly that other people could have noticed. Or the opposite - being so fidgety or restless that you have been moving around a lot more than usual: 0 Thoughts that you would be better off dead, or of hurting yourself in some way: 0 PHQ-9 Total Score: 2 If you checked off any problems, how difficult have these problems made it for you to do your work, take care of things at home, or get along with other people?: Not difficult at all  Depression Treatment Depression Interventions/Treatment : EYV7-0 Score <4 Follow-up Not Indicated     Goals Addressed             This Visit's Progress    Patient Stated       Patient would like to lose more weight              Objective:    Today's Vitals   04/08/24 0944  BP: 110/66  Weight: 279 lb (126.6 kg)  Height: 5' 4 (1.626 m)   Body mass index is 47.89 kg/m.  Hearing/Vision screen Hearing Screening - Comments:: No difficulties  Vision Screening - Comments:: Patient wears glasses. Patient sees International Aid/development Worker and Health Maintenance Health Maintenance  Topic Date Due   Zoster Vaccines- Shingrix (1 of 2) 05/25/2025 (Originally 12/04/1979)   Medicare Annual Wellness (AWV)  04/08/2025   Colonoscopy  01/30/2028   DTaP/Tdap/Td (2 - Td or Tdap) 06/18/2030   Pneumococcal Vaccine: 50+ Years  Completed   Influenza Vaccine  Completed   HIV Screening  Completed   Hepatitis B Vaccines  19-59 Average Risk  Aged Out   HPV VACCINES  Aged Out   Meningococcal B Vaccine  Aged Out   COVID-19 Vaccine  Discontinued   Hepatitis C Screening  Discontinued        Assessment/Plan:  This is a routine wellness examination for Randall Rojas.  Patient Care Team: Nicholaus Credit, DEVONNA as PCP - General (Physician Assistant) Tobb, Kardie, DO as  PCP - Cardiology (Cardiology) Livingston No, PA-C as Physician Assistant (General Practice) Cyrena Donnice DEL, MD as Referring Physician (Neurology) Hope Almarie ORN, NP as Nurse Practitioner (Pulmonary Disease) Ezzard Valaria LABOR, MD as Consulting Physician (Oncology) Jenel Arvin POUR., MD as Referring Physician (Psychiatry) Marda General, MD as Consulting Physician (Urology)  I have personally reviewed and noted the following in the patients chart:   Medical and social history Use of alcohol, tobacco or illicit drugs  Current medications and supplements including opioid prescriptions. Functional ability and status Nutritional status Physical activity Advanced directives List of other physicians Hospitalizations, surgeries, and ER visits in previous 12 months Vitals Screenings to include cognitive, depression, and falls Referrals and appointments  No orders of the defined types were placed in this encounter.  In addition, I have reviewed and discussed with patient certain preventive protocols, quality metrics, and best practice recommendations. A written personalized care plan for preventive services as well as general preventive health recommendations were provided to patient.   Lyle POUR Right, [place]   04/08/2024   No follow-ups on file.  After Visit Summary: (MyChart) Due to this being a telephonic visit, the after visit summary with patients personalized plan was offered to patient via MyChart   Separate telephone note sent for (Medications ) Patient has no other voiced concerns at this time. "

## 2024-04-08 NOTE — Telephone Encounter (Signed)
Called patient left voicemail to call office back.

## 2024-04-09 NOTE — Telephone Encounter (Signed)
 Spoke with patient's wife, told her patient needed to be seen, PCP does not has anything for tomorrow, patient is coming in tomorrow at 3:20 to see Nola Angles, PA-C. Patient could not come today per wife. Wife verbalized understanding and had no questions at this time.

## 2024-04-10 ENCOUNTER — Encounter: Payer: Self-pay | Admitting: Physician Assistant

## 2024-04-10 ENCOUNTER — Ambulatory Visit: Admitting: Physician Assistant

## 2024-04-10 ENCOUNTER — Ambulatory Visit (INDEPENDENT_AMBULATORY_CARE_PROVIDER_SITE_OTHER): Admitting: Physician Assistant

## 2024-04-10 VITALS — BP 110/70 | HR 81 | Temp 97.5°F | Resp 18 | Ht 64.0 in | Wt 285.8 lb

## 2024-04-10 DIAGNOSIS — K219 Gastro-esophageal reflux disease without esophagitis: Secondary | ICD-10-CM

## 2024-04-10 DIAGNOSIS — K5900 Constipation, unspecified: Secondary | ICD-10-CM

## 2024-04-10 MED ORDER — OMEPRAZOLE 40 MG PO CPDR: 40.0000 mg | DELAYED_RELEASE_CAPSULE | Freq: Every day | ORAL | 1 refills | Status: AC

## 2024-04-10 MED ORDER — SUCRALFATE 1 G PO TABS: 1.0000 g | ORAL_TABLET | Freq: Two times a day (BID) | ORAL | 0 refills | Status: AC

## 2024-04-10 NOTE — Assessment & Plan Note (Addendum)
 Gastroesophageal reflux disease with hiatal hernia Worsening heartburn and acid reflux. No dysphagia, odynophagia, hematemesis, or melena. Current medications ineffective. Discussed potential H. pylori testing if symptoms persist. - Increased Protonix to twice daily for one week. - Continue Pepcid  at night. - Initiated Carafate  1g for one week. - Ordered abdominal x-ray. - Consider H. pylori testing after two weeks off Protonix 40mg  if symptoms persist. - Consider imaging if symptoms worsen or hematemesis/melena occur. Orders:   sucralfate  (CARAFATE ) 1 g tablet; Take 1 tablet (1 g total) by mouth 2 (two) times daily.   omeprazole  (PRILOSEC) 40 MG capsule; Take 1 capsule (40 mg total) by mouth daily.   Ambulatory referral to Gastroenterology

## 2024-04-10 NOTE — Progress Notes (Signed)
 "  Subjective:  Patient ID: Randall Rojas, male    DOB: 05/19/1960  Age: 63 y.o. MRN: [id number]  Chief Complaint  Patient presents with   Heartburn    HPI: Discussed the use of AI scribe software for clinical note transcription with the patient, who gave verbal consent to proceed.  History of Present Illness Randall Rojas is a 64 year old male with a history of hiatal hernia and prostate cancer who presents with worsening abdominal pain and heartburn.  He has experienced worsening heartburn over the past two to three months, exacerbated by bending over, eating certain foods, and lying down. Even eating Jell-O with fruit recently caused vomiting. He has been taking Protonix 40 mg daily, which was previously effective but is no longer providing relief. He also takes Pepcid  once daily.  He has a history of a hiatal hernia diagnosed years ago and underwent an endoscopy and colonoscopy last year, though he is unsure of the biopsy results. No current blood in stool or vomiting, though he has experienced this in the past. He does not recall the last occurrence of an ulcer.  He experiences occasional difficulty swallowing but denies food getting stuck. Vomiting occurs a couple of times at night if he lies back down after eating.  He is currently undergoing treatment for prostate cancer and is on multiple medications for this condition. He cannot tolerate Nexium due to stomach upset.  He reports irregular bowel movements, occurring about twice a week recently, which is atypical for him. He denies a history of constipation but notes recent changes coinciding with his current symptoms.  He is currently taking Zepbound  15 mg, which he started last Saturday, but reports that his symptoms were present even at a lower dose.          04/08/2024    9:53 AM 02/07/2024    4:00 PM 01/31/2024    9:34 AM 11/13/2023   10:00 AM 09/26/2023    9:11 AM  Depression screen PHQ 2/9  Decreased Interest 0  0 1 0 2  Down, Depressed, Hopeless 0 0 1 0 0  PHQ - 2 Score 0 0 2 0 2  Altered sleeping 1  1  0  Tired, decreased energy 0  1  2  Change in appetite 0  0  0  Feeling bad or failure about yourself  1  0  1  Trouble concentrating 0  0  0  Moving slowly or fidgety/restless 0  0  0  Suicidal thoughts 0  0  0  PHQ-9 Score 2  4   5    Difficult doing work/chores Not difficult at all    Not difficult at all     Data saved with a previous flowsheet row definition        04/08/2024    8:09 AM  Fall Risk   Falls in the past year? 0   Number falls in past yr: 0  Injury with Fall? 0  Risk for fall due to : No Fall Risks  Follow up Falls evaluation completed     Manually entered by patient    Patient Care Team: Nicholaus Credit, PA-C as PCP - General (Physician Assistant) Sheena Pugh, DO as PCP - Cardiology (Cardiology) Livingston No, PA-C as Physician Assistant (General Practice) Cyrena Donnice DEL, MD as Referring Physician (Neurology) Hope Almarie ORN, NP as Nurse Practitioner (Pulmonary Disease) Ezzard Valaria LABOR, MD as Consulting Physician (Oncology) Jenel Arvin POUR., MD as Referring Physician (  Psychiatry) Marda General, MD as Consulting Physician (Urology)   Review of Systems  Constitutional:  Negative for appetite change, fatigue and fever.  HENT:  Negative for congestion, ear pain, sinus pressure and sore throat.   Eyes: Negative.   Respiratory:  Negative for cough, chest tightness, shortness of breath and wheezing.   Cardiovascular:  Negative for chest pain and palpitations.  Gastrointestinal:  Positive for constipation, diarrhea, nausea and vomiting. Negative for abdominal pain.  Endocrine: Negative.   Genitourinary:  Negative for dysuria and hematuria.  Musculoskeletal:  Negative for arthralgias, back pain, joint swelling and myalgias.  Skin:  Negative for rash.  Allergic/Immunologic: Negative.   Neurological:  Negative for dizziness, weakness, light-headedness and headaches.   Hematological: Negative.   Psychiatric/Behavioral:  Negative for dysphoric mood. The patient is not nervous/anxious.     Medications Ordered Prior to Encounter[1] Past Medical History:  Diagnosis Date   Acute laryngopharyngitis 11/11/2020   Acute URI 12/12/2019   Anxiety 06/17/2020   Arthralgia 06/17/2020   Asthma    Bilateral leg edema 06/23/2020   Chest pain 04/15/2015   Chronic diastolic heart failure (HCC) 08/05/2020   Chronic heart failure with preserved ejection fraction (HCC) 07/11/2020   Coronary artery calcification seen on CT scan 04/15/2015   Coronary artery disease    Coronary artery disease involving native coronary artery of native heart without angina pectoris 07/11/2020   Coronary artery disease of native artery of native heart with stable angina pectoris 06/23/2020   Essential hypertension 04/15/2015   Exertional dyspnea 05/26/2020   Frequent headaches 07/20/2020   GERD (gastroesophageal reflux disease) 05/27/2019   History of CVA in adulthood 08/05/2020   HLD (hyperlipidemia) 07/11/2020   Hyperglycemia 06/06/2015   Hyperlipidemia 04/15/2015   Hypertension 08/05/2020   Hypokalemia 07/20/2020   Left sided numbness 07/25/2020   Leukocytosis 06/06/2015   Malaise 06/17/2020   Mixed hyperlipidemia 04/15/2015   Morbid obesity (HCC) 04/15/2015   Need for prophylactic vaccination against Streptococcus pneumoniae (pneumococcus) 12/03/2019   Need for prophylactic vaccination and inoculation against influenza 12/03/2019   Need for tetanus booster 06/17/2020   Nonepileptic episode (HCC) 10/20/2020   Obesity, Class III, BMI 40-49.9 (morbid obesity) (HCC) 04/15/2015   Other sleep apnea 07/20/2020   Palpitations 08/05/2020   Pneumonia due to COVID-19 virus 04/06/2019   Prediabetes 06/23/2020   Prostate cancer screening 06/02/2019   Rectal bleeding 11/01/2020   Seizure-like activity (HCC) 05/26/2020   Shortness of breath 06/23/2020   TIA (transient ischemic attack)    Upper extremity weakness 05/26/2020    Weakness of left lower extremity 05/26/2020   Past Surgical History:  Procedure Laterality Date   CARDIAC CATHETERIZATION N/A 04/22/2015   Procedure: Left Heart Cath and Coronary Angiography;  Surgeon: Peter M Jordan, MD;  Location: [facility] INVASIVE CV LAB;  Service: Cardiovascular;  Laterality: N/A;   GALLBLADDER SURGERY     LEFT HEART CATH AND CORONARY ANGIOGRAPHY N/A 10/28/2020   Procedure: LEFT HEART CATH AND CORONARY ANGIOGRAPHY;  Surgeon: Burnard Debby LABOR, MD;  Location: [facility] INVASIVE CV LAB;  Service: Cardiovascular;  Laterality: N/A;   SINUS SURGERY WITH INSTATRAK      Family History  Problem Relation Age of Onset   Hypertension Mother    Rheum arthritis Mother    Heart attack Father    Prostate cancer Father        METS TO BRAIN; H/O SKIN CANCER   Hypertension Sister    Heart disease Sister    Heart attack Sister  Hypertension Sister    Prostate cancer Brother        METS TO BONE AND LIVER   Breast cancer Paternal Aunt    Prostate cancer Paternal Uncle        METS TO LYMPH NODES   Cancer Paternal Uncle        UNKNOWN PRIMARY ORAL MALIGNANCY   Breast cancer Cousin    Social History   Socioeconomic History   Marital status: Married    Spouse name: [name]   Number of children: 3   Years of education: 12   Highest education level: GED or equivalent  Occupational History   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   Occupation: DISABLED  Tobacco Use   Smoking status: Former    Current packs/day: 0.00    Average packs/day: 3.0 packs/day    Types: Cigarettes    Quit date: 04/13/1989    Years since quitting: 35.0   Smokeless tobacco: Never  Vaping Use   Vaping status: Never Used  Substance and Sexual Activity   Alcohol use: Never   Drug use: Never   Sexual activity: Not Currently  Other Topics Concern   Not on file  Social History Narrative   Epworth Sleepiness Scale = 7 (as of 1/18/207)   Social Drivers of Health   Tobacco Use: Medium Risk (04/10/2024)   Patient History     Smoking Tobacco Use: Former    Smokeless Tobacco Use: Never    Passive Exposure: Not on file  Financial Resource Strain: Medium Risk (04/08/2024)   Overall Financial Resource Strain (CARDIA)    Difficulty of Paying Living Expenses: Somewhat hard  Food Insecurity: Food Insecurity Present (04/08/2024)   Epic    Worried About Programme Researcher, Broadcasting/film/video in the Last Year: Sometimes true    Ran Out of Food in the Last Year: Sometimes true  Transportation Needs: No Transportation Needs (04/08/2024)   Epic    Lack of Transportation (Medical): No    Lack of Transportation (Non-Medical): No  Physical Activity: Patient Declined (04/08/2024)   Exercise Vital Sign    Days of Exercise per Week: Patient declined    Minutes of Exercise per Session: Patient declined  Stress: No Stress Concern Present (04/08/2024)   [employer] of Occupational Health - Occupational Stress Questionnaire    Feeling of Stress: Only a little  Social Connections: Socially Integrated (04/08/2024)   Social Connection and Isolation Panel    Frequency of Communication with Friends and Family: More than three times a week    Frequency of Social Gatherings with Friends and Family: Three times a week    Attends Religious Services: More than 4 times per year    Active Member of Clubs or Organizations: Yes    Attends Banker Meetings: 1 to 4 times per year    Marital Status: Married  Depression (PHQ2-9): Low Risk (04/08/2024)   Depression (PHQ2-9)    PHQ-2 Score: 2  Alcohol Screen: Low Risk (04/08/2024)   Alcohol Screen    Last Alcohol Screening Score (AUDIT): 0  Housing: Low Risk (04/08/2024)   Epic    Unable to Pay for Housing in the Last Year: No    Number of Times Moved in the Last Year: 0    Homeless in the Last Year: No  Utilities: Not At Risk (04/08/2024)   Epic    Threatened with loss of utilities: No  Health Literacy: Adequate Health Literacy (04/08/2024)   B1300 Health Literacy    Frequency of need  for help  with medical instructions: Never    Objective:  BP 110/70   Pulse 81   Temp (!) 97.5 F (36.4 C) (Temporal)   Resp 18   Ht 5' 4 (1.626 m)   Wt 285 lb 12.8 oz (129.6 kg)   SpO2 96%   BMI 49.06 kg/m      04/10/2024    3:16 PM 04/08/2024    9:44 AM 03/28/2024   11:25 AM  BP/Weight  Systolic BP 110 110 134  Diastolic BP 70 66 62  Wt. (Lbs) 285.8 279 285.8  BMI 49.06 kg/m2 47.89 kg/m2 49.06 kg/m2    Physical Exam Vitals reviewed.  Constitutional:      Appearance: Normal appearance.  Cardiovascular:     Rate and Rhythm: Normal rate and regular rhythm.     Heart sounds: Normal heart sounds.  Pulmonary:     Effort: Pulmonary effort is normal.     Breath sounds: Normal breath sounds.  Abdominal:     General: Bowel sounds are normal.     Palpations: Abdomen is soft.     Tenderness: There is no abdominal tenderness. There is no guarding or rebound.  Neurological:     Mental Status: He is alert and oriented to person, place, and time.  Psychiatric:        Mood and Affect: Mood normal.        Behavior: Behavior normal.         Lab Results  Component Value Date   WBC 8.3 01/31/2024   HGB 15.5 01/31/2024   HCT 47.8 01/31/2024   PLT 142 (L) 01/31/2024   GLUCOSE 91 01/31/2024   CHOL 146 01/31/2024   TRIG 130 01/31/2024   HDL 36 (L) 01/31/2024   LDLCALC 87 01/31/2024   ALT 19 01/31/2024   AST 19 01/31/2024   NA 142 01/31/2024   K 4.1 01/31/2024   CL 103 01/31/2024   CREATININE 1.02 01/31/2024   BUN 17 01/31/2024   CO2 24 01/31/2024   TSH 1.120 01/31/2024   INR 0.92 04/14/2015   HGBA1C 5.3 01/31/2024    Results for orders placed or performed in visit on 02/12/24  Pulmonary Function Test   Collection Time: 02/12/24  9:32 AM  Result Value Ref Range   FVC-Pre 4.50 L   FVC-%Pred-Pre 116 %   FVC-Post 4.56 L   FVC-%Pred-Post 118 %   FVC-%Change-Post 1 %   FEV1-Pre 3.56 L   FEV1-%Pred-Pre 123 %   FEV1-Post 3.56 L   FEV1-%Pred-Post 123 %   FEV1-%Change-Post  0 %   FEV6-Pre 4.43 L   FEV6-%Pred-Pre 121 %   FEV6-Post 4.54 L   FEV6-%Pred-Post 124 %   FEV6-%Change-Post 2 %   Pre FEV1/FVC ratio 79 %   FEV1FVC-%Pred-Pre 105 %   Post FEV1/FVC ratio 78 %   FEV1FVC-%Change-Post -1 %   Pre FEV6/FVC Ratio 99 %   FEV6FVC-%Pred-Pre 104 %   Post FEV6/FVC ratio 100 %   FEV6FVC-%Pred-Post 105 %   FEV6FVC-%Change-Post 0 %   FEF 25-75 Pre 3.57 L/sec   FEF2575-%Pred-Pre 150 %   FEF 25-75 Post 3.33 L/sec   FEF2575-%Pred-Post 140 %   FEF2575-%Change-Post -6 %   RV 2.20 L   RV % pred 108 %   TLC 6.89 L   TLC % pred 114 %   DLCO unc 27.45 ml/min/mmHg   DLCO unc % pred 118 %   DLCO cor 26.80 ml/min/mmHg   DLCO cor % pred 115 %  DL/VA 6.06 ml/min/mmHg/L   DL/VA % pred 92 %  .  Assessment & Plan:   Assessment & Plan Constipation, unspecified constipation type Constipation Recent irregular bowel movements. Possible stool burden affecting reflux. - Ordered abdominal x-ray to assess stool burden. Orders:   DG Abd 2 Views; Future   Ambulatory referral to Gastroenterology  Gastroesophageal reflux disease without esophagitis Gastroesophageal reflux disease with hiatal hernia Worsening heartburn and acid reflux. No dysphagia, odynophagia, hematemesis, or melena. Current medications ineffective. Discussed potential H. pylori testing if symptoms persist. - Increased Protonix to twice daily for one week. - Continue Pepcid  at night. - Initiated Carafate  1g for one week. - Ordered abdominal x-ray. - Consider H. pylori testing after two weeks off Protonix 40mg  if symptoms persist. - Consider imaging if symptoms worsen or hematemesis/melena occur. Orders:   sucralfate  (CARAFATE ) 1 g tablet; Take 1 tablet (1 g total) by mouth 2 (two) times daily.   omeprazole  (PRILOSEC) 40 MG capsule; Take 1 capsule (40 mg total) by mouth daily.   Ambulatory referral to Gastroenterology    Body mass index is 49.06 kg/m.   No orders of the defined types were placed  in this encounter.  No orders of the defined types were placed in this encounter.      Follow-up: No follow-ups on file.  An After Visit Summary was printed and given to the patient.  Nola Angles, [place] [facility] [phone]     [1]  Current Outpatient Medications on File Prior to Visit  Medication Sig Dispense Refill   Accu-Chek Softclix Lancets lancets Use up to 4 times daily as directed. 100 each 0   acetaminophen  (TYLENOL ) 500 MG tablet Take by mouth.     Albuterol -Budesonide (AIRSUPRA ) 90-80 MCG/ACT AERO Inhale 2 puffs into the lungs every 6 (six) hours as needed (Asthma exacerbation). 10.7 g 3   amitriptyline  (ELAVIL ) 50 MG tablet TAKE ONE TABLET BY MOUTH AT BEDTIME 90 tablet 1   baclofen (LIORESAL) 10 MG tablet Take 10-20 tablets by mouth every 6 (six) hours as needed (pain).     benzonatate  (TESSALON ) 200 MG capsule Take 1 capsule (200 mg total) by mouth 3 (three) times daily as needed for cough. 30 capsule 1   blood glucose meter kit and supplies KIT Dispense based on patient and insurance preference. Use up to four times daily as directed. 1 each 0   budesonide-glycopyrrolate-formoterol (BREZTRI  AEROSPHERE) 160-9-4.8 MCG/ACT AERO inhaler Inhale 2 puffs into the lungs 2 (two) times daily. 10.7 g 11   bumetanide  (BUMEX ) 2 MG tablet Take 1 tablet (2 mg total) by mouth 2 (two) times daily. 180 tablet 0   busPIRone (BUSPAR) 30 MG tablet Take 30 mg by mouth 2 (two) times daily.     finasteride (PROSCAR) 5 MG tablet Take 5 mg by mouth daily.     GOODSENSE ASPIRIN  81 MG chewable tablet Chew 81 mg by mouth daily.     ibuprofen (ADVIL) 800 MG tablet Take 800 mg by mouth 3 (three) times daily as needed.     ketoconazole  (NIZORAL ) 2 % shampoo Apply topically 2 (two) times a week. 120 mL 1   Lacosamide 100 MG TABS Take 1 tablet by mouth once daily at bedtime x1 week, then take 1 tablet by mouth twice a day x1 week, then take 1 tablet by mouth every morning and 2 tablets by  mouth at night x1 week, then take 2 tablets by mouth every morning and 2 tablets by mouth at  night thereafter     levETIRAcetam  (KEPPRA ) 750 MG tablet Take 1,500 mg by mouth 2 (two) times daily.     meclizine (ANTIVERT) 25 MG tablet Take 25 mg by mouth 3 (three) times daily as needed.     Misc Natural Products (URINOZINC PLUS PO) Take by mouth daily.     modafinil (PROVIGIL) 200 MG tablet Take 200 mg by mouth 2 (two) times daily.     montelukast (SINGULAIR) 10 MG tablet Take 10 mg by mouth at bedtime.     nitroGLYCERIN  (NITROSTAT ) 0.4 MG SL tablet Place 1 tablet (0.4 mg total) under the tongue every 5 (five) minutes as needed for chest pain. Up to three times. 45 tablet 3   omeprazole  (PRILOSEC) 40 MG capsule Take 1 capsule (40 mg total) by mouth daily. 90 capsule 1   OXYGEN  1 L. With CPAP     potassium chloride  SA (KLOR-CON  M) 20 MEQ tablet Take 1 tablet by mouth twice daily. 180 tablet 3   promethazine  (PHENERGAN ) 25 MG tablet Take 1 tablet by mouth every 6 hours as needed for nausea or vomiting. 30 tablet 1   ranolazine  (RANEXA ) 500 MG 12 hr tablet Take 1 tablet by mouth 2 times daily. 180 tablet 2   rosuvastatin  (CRESTOR ) 10 MG tablet Take 1 tablet (10 mg total) by mouth daily. 90 tablet 3   sertraline (ZOLOFT) 100 MG tablet Take 200 mg by mouth daily.     tadalafil (CIALIS) 20 MG tablet SMARTSIG:1 Tablet(s) By Mouth     tirzepatide  (ZEPBOUND ) 15 MG/0.5ML Pen Inject 15 mg into the skin once a week. 2 mL 3   traZODone (DESYREL) 50 MG tablet Take 100 mg by mouth at bedtime as needed.     triamcinolone  cream (KENALOG ) 0.1 % Apply to the affected area(s) twice a day as directed 30 g 1   vitamin B-12 (CYANOCOBALAMIN ) 1000 MCG tablet Take 1,000 mcg by mouth daily.     Vitamin D , Ergocalciferol , (DRISDOL ) 1.25 MG (50000 UNIT) CAPS capsule Take 1 capsule by mouth once every 7 days as directed 12 capsule 1   No current facility-administered medications on file prior to visit.   "

## 2024-04-14 ENCOUNTER — Ambulatory Visit (HOSPITAL_BASED_OUTPATIENT_CLINIC_OR_DEPARTMENT_OTHER)
Admission: RE | Admit: 2024-04-14 | Discharge: 2024-04-14 | Disposition: A | Source: Ambulatory Visit | Attending: Physician Assistant | Admitting: Radiology

## 2024-04-14 DIAGNOSIS — K5900 Constipation, unspecified: Secondary | ICD-10-CM | POA: Diagnosis not present

## 2024-04-14 DIAGNOSIS — R109 Unspecified abdominal pain: Secondary | ICD-10-CM

## 2024-04-15 ENCOUNTER — Other Ambulatory Visit: Payer: Self-pay | Admitting: Physician Assistant

## 2024-04-22 ENCOUNTER — Ambulatory Visit: Payer: Self-pay | Admitting: Physician Assistant

## 2024-04-30 ENCOUNTER — Other Ambulatory Visit: Payer: Self-pay | Admitting: Primary Care

## 2024-04-30 NOTE — Telephone Encounter (Signed)
 Please advise, thank you

## 2024-05-01 ENCOUNTER — Other Ambulatory Visit (HOSPITAL_COMMUNITY): Payer: Self-pay

## 2024-05-01 ENCOUNTER — Telehealth: Payer: Self-pay

## 2024-05-01 NOTE — Telephone Encounter (Signed)
 Pharmacy Patient Advocate Encounter   Received notification from RX Request Messages that prior authorization for Zepbound  15mg /0.16ml is required/requested.   Insurance verification completed.   The patient is insured through [facility].   Per test claim: PA required; PA submitted to above mentioned insurance via Latent Key/confirmation #/EOC [id number] Status is pending

## 2024-05-01 NOTE — Telephone Encounter (Signed)
 PA request has been Submitted. New Encounter has been or will be created for follow up. For additional info see Pharmacy Prior Auth telephone encounter from 05/01/24.

## 2024-05-02 ENCOUNTER — Other Ambulatory Visit (HOSPITAL_COMMUNITY): Payer: Self-pay

## 2024-05-02 NOTE — Telephone Encounter (Signed)
 Pharmacy Patient Advocate Encounter  Received notification from [facility] MEDICARE that Prior Authorization for Zepbound  15mg /0.75ml has been APPROVED from 05/01/24 to 03/26/25   PA #/Case ID/Reference #: [id number]

## 2024-05-02 NOTE — Telephone Encounter (Signed)
 I called and spoke to pt. Pt informed of Pharmacy team note and verbalized understanding. NFN

## 2024-05-20 ENCOUNTER — Ambulatory Visit: Admitting: Primary Care

## 2024-06-03 ENCOUNTER — Ambulatory Visit: Admitting: Physician Assistant
